# Patient Record
Sex: Female | Born: 1935 | Race: White | Hispanic: No | Marital: Married | Smoking: Never smoker
Health system: Southern US, Community
[De-identification: ages and names within clinical notes are randomized; demographics above are authoritative.]

## PROBLEM LIST (undated history)

## (undated) DIAGNOSIS — D696 Thrombocytopenia, unspecified: Secondary | ICD-10-CM

## (undated) DIAGNOSIS — Z7901 Long term (current) use of anticoagulants: Secondary | ICD-10-CM

## (undated) DIAGNOSIS — I5022 Chronic systolic (congestive) heart failure: Secondary | ICD-10-CM

## (undated) DIAGNOSIS — I2510 Atherosclerotic heart disease of native coronary artery without angina pectoris: Secondary | ICD-10-CM

## (undated) DIAGNOSIS — I509 Heart failure, unspecified: Secondary | ICD-10-CM

## (undated) DIAGNOSIS — I209 Angina pectoris, unspecified: Secondary | ICD-10-CM

## (undated) DIAGNOSIS — G20A1 Parkinson's disease without dyskinesia, without mention of fluctuations: Secondary | ICD-10-CM

## (undated) DIAGNOSIS — G43909 Migraine, unspecified, not intractable, without status migrainosus: Secondary | ICD-10-CM

## (undated) DIAGNOSIS — R0602 Shortness of breath: Secondary | ICD-10-CM

## (undated) DIAGNOSIS — G4733 Obstructive sleep apnea (adult) (pediatric): Secondary | ICD-10-CM

## (undated) DIAGNOSIS — N393 Stress incontinence (female) (male): Secondary | ICD-10-CM

## (undated) DIAGNOSIS — G20 Parkinson's disease: Secondary | ICD-10-CM

## (undated) DIAGNOSIS — Z950 Presence of cardiac pacemaker: Secondary | ICD-10-CM

## (undated) DIAGNOSIS — D649 Anemia, unspecified: Secondary | ICD-10-CM

## (undated) DIAGNOSIS — I255 Ischemic cardiomyopathy: Secondary | ICD-10-CM

## (undated) DIAGNOSIS — I10 Essential (primary) hypertension: Secondary | ICD-10-CM

## (undated) DIAGNOSIS — F32A Depression, unspecified: Secondary | ICD-10-CM

## (undated) DIAGNOSIS — E785 Hyperlipidemia, unspecified: Secondary | ICD-10-CM

## (undated) DIAGNOSIS — R531 Weakness: Secondary | ICD-10-CM

## (undated) DIAGNOSIS — K219 Gastro-esophageal reflux disease without esophagitis: Secondary | ICD-10-CM

## (undated) DIAGNOSIS — Z8719 Personal history of other diseases of the digestive system: Secondary | ICD-10-CM

## (undated) DIAGNOSIS — I442 Atrioventricular block, complete: Principal | ICD-10-CM

## (undated) DIAGNOSIS — Z9989 Dependence on other enabling machines and devices: Secondary | ICD-10-CM

## (undated) DIAGNOSIS — F329 Major depressive disorder, single episode, unspecified: Secondary | ICD-10-CM

## (undated) DIAGNOSIS — Z9289 Personal history of other medical treatment: Secondary | ICD-10-CM

## (undated) DIAGNOSIS — E039 Hypothyroidism, unspecified: Secondary | ICD-10-CM

## (undated) HISTORY — DX: Atrioventricular block, complete: I44.2

## (undated) HISTORY — PX: INCONTINENCE SURGERY: SHX676

## (undated) HISTORY — PX: CARDIAC CATHETERIZATION: SHX172

## (undated) HISTORY — DX: Stress incontinence (female) (male): N39.3

## (undated) HISTORY — DX: Parkinson's disease: G20

## (undated) HISTORY — PX: CORONARY ANGIOPLASTY WITH STENT PLACEMENT: SHX49

## (undated) HISTORY — PX: CARDIAC VALVE REPLACEMENT: SHX585

## (undated) HISTORY — DX: Chronic systolic (congestive) heart failure: I50.22

## (undated) HISTORY — PX: CHOLECYSTECTOMY: SHX55

## (undated) HISTORY — DX: Ischemic cardiomyopathy: I25.5

## (undated) HISTORY — PX: VAGINAL HYSTERECTOMY: SUR661

## (undated) HISTORY — DX: Presence of cardiac pacemaker: Z95.0

## (undated) HISTORY — PX: APPENDECTOMY: SHX54

## (undated) HISTORY — DX: Weakness: R53.1

## (undated) HISTORY — PX: PACEMAKER GENERATOR CHANGE: SHX5998

## (undated) HISTORY — PX: CATARACT EXTRACTION, BILATERAL: SHX1313

## (undated) HISTORY — DX: Hyperlipidemia, unspecified: E78.5

## (undated) HISTORY — DX: Parkinson's disease without dyskinesia, without mention of fluctuations: G20.A1

---
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
## 1998-06-27 ENCOUNTER — Encounter: Payer: Self-pay | Admitting: Cardiovascular Disease

## 1998-06-27 ENCOUNTER — Inpatient Hospital Stay (HOSPITAL_COMMUNITY): Admission: EM | Admit: 1998-06-27 | Discharge: 1998-06-29 | Payer: Self-pay | Admitting: Emergency Medicine

## 1998-07-19 ENCOUNTER — Inpatient Hospital Stay (HOSPITAL_COMMUNITY): Admission: EM | Admit: 1998-07-19 | Discharge: 1998-07-23 | Payer: Self-pay | Admitting: *Deleted

## 1998-07-20 ENCOUNTER — Encounter: Payer: Self-pay | Admitting: Cardiology

## 1999-02-12 ENCOUNTER — Ambulatory Visit (HOSPITAL_COMMUNITY): Admission: RE | Admit: 1999-02-12 | Discharge: 1999-02-13 | Payer: Self-pay | Admitting: Cardiovascular Disease

## 1999-04-30 ENCOUNTER — Inpatient Hospital Stay (HOSPITAL_COMMUNITY): Admission: AD | Admit: 1999-04-30 | Discharge: 1999-05-02 | Payer: Self-pay | Admitting: Cardiovascular Disease

## 1999-06-04 ENCOUNTER — Ambulatory Visit (HOSPITAL_COMMUNITY): Admission: RE | Admit: 1999-06-04 | Discharge: 1999-06-05 | Payer: Self-pay | Admitting: Cardiovascular Disease

## 1999-07-07 ENCOUNTER — Encounter: Payer: Self-pay | Admitting: Cardiovascular Disease

## 1999-07-07 ENCOUNTER — Encounter: Payer: Self-pay | Admitting: Cardiology

## 1999-07-07 ENCOUNTER — Inpatient Hospital Stay (HOSPITAL_COMMUNITY): Admission: EM | Admit: 1999-07-07 | Discharge: 1999-07-09 | Payer: Self-pay | Admitting: Emergency Medicine

## 1999-09-24 ENCOUNTER — Ambulatory Visit (HOSPITAL_COMMUNITY): Admission: RE | Admit: 1999-09-24 | Discharge: 1999-09-25 | Payer: Self-pay | Admitting: Cardiovascular Disease

## 2000-02-26 ENCOUNTER — Inpatient Hospital Stay (HOSPITAL_COMMUNITY): Admission: AD | Admit: 2000-02-26 | Discharge: 2000-02-28 | Payer: Self-pay | Admitting: Cardiovascular Disease

## 2000-02-28 ENCOUNTER — Encounter: Payer: Self-pay | Admitting: Cardiovascular Disease

## 2000-03-22 ENCOUNTER — Ambulatory Visit (HOSPITAL_COMMUNITY): Admission: RE | Admit: 2000-03-22 | Discharge: 2000-03-22 | Payer: Self-pay | Admitting: Cardiovascular Disease

## 2000-10-13 ENCOUNTER — Encounter: Payer: Self-pay | Admitting: Emergency Medicine

## 2000-10-13 ENCOUNTER — Inpatient Hospital Stay (HOSPITAL_COMMUNITY): Admission: EM | Admit: 2000-10-13 | Discharge: 2000-10-15 | Payer: Self-pay | Admitting: Emergency Medicine

## 2000-11-04 ENCOUNTER — Inpatient Hospital Stay (HOSPITAL_COMMUNITY): Admission: RE | Admit: 2000-11-04 | Discharge: 2000-11-05 | Payer: Self-pay | Admitting: Cardiovascular Disease

## 2001-06-22 HISTORY — PX: CORONARY ARTERY BYPASS GRAFT: SHX141

## 2001-10-18 ENCOUNTER — Encounter: Payer: Self-pay | Admitting: Cardiovascular Disease

## 2001-10-19 ENCOUNTER — Inpatient Hospital Stay (HOSPITAL_COMMUNITY): Admission: AD | Admit: 2001-10-19 | Discharge: 2001-10-21 | Payer: Self-pay | Admitting: Cardiovascular Disease

## 2002-01-18 ENCOUNTER — Encounter (INDEPENDENT_AMBULATORY_CARE_PROVIDER_SITE_OTHER): Payer: Self-pay | Admitting: Specialist

## 2002-01-19 ENCOUNTER — Encounter (INDEPENDENT_AMBULATORY_CARE_PROVIDER_SITE_OTHER): Payer: Self-pay | Admitting: Cardiology

## 2002-01-21 ENCOUNTER — Inpatient Hospital Stay (HOSPITAL_COMMUNITY): Admission: AD | Admit: 2002-01-21 | Discharge: 2002-02-03 | Payer: Self-pay | Admitting: Cardiovascular Disease

## 2002-01-22 ENCOUNTER — Encounter: Payer: Self-pay | Admitting: Cardiovascular Disease

## 2002-01-24 ENCOUNTER — Encounter: Payer: Self-pay | Admitting: Thoracic Surgery (Cardiothoracic Vascular Surgery)

## 2002-01-25 ENCOUNTER — Encounter: Payer: Self-pay | Admitting: Thoracic Surgery (Cardiothoracic Vascular Surgery)

## 2002-01-26 ENCOUNTER — Encounter: Payer: Self-pay | Admitting: Thoracic Surgery (Cardiothoracic Vascular Surgery)

## 2002-01-27 ENCOUNTER — Encounter: Payer: Self-pay | Admitting: Cardiothoracic Surgery

## 2002-01-28 ENCOUNTER — Encounter: Payer: Self-pay | Admitting: Thoracic Surgery (Cardiothoracic Vascular Surgery)

## 2002-01-30 ENCOUNTER — Encounter: Payer: Self-pay | Admitting: Thoracic Surgery (Cardiothoracic Vascular Surgery)

## 2002-01-31 ENCOUNTER — Encounter: Payer: Self-pay | Admitting: Thoracic Surgery (Cardiothoracic Vascular Surgery)

## 2002-02-27 ENCOUNTER — Encounter: Payer: Self-pay | Admitting: Thoracic Surgery (Cardiothoracic Vascular Surgery)

## 2002-02-27 ENCOUNTER — Encounter
Admission: RE | Admit: 2002-02-27 | Discharge: 2002-02-27 | Payer: Self-pay | Admitting: Thoracic Surgery (Cardiothoracic Vascular Surgery)

## 2002-03-06 ENCOUNTER — Encounter
Admission: RE | Admit: 2002-03-06 | Discharge: 2002-03-06 | Payer: Self-pay | Admitting: Thoracic Surgery (Cardiothoracic Vascular Surgery)

## 2002-03-06 ENCOUNTER — Encounter: Payer: Self-pay | Admitting: Thoracic Surgery (Cardiothoracic Vascular Surgery)

## 2002-03-27 ENCOUNTER — Encounter
Admission: RE | Admit: 2002-03-27 | Discharge: 2002-03-27 | Payer: Self-pay | Admitting: Thoracic Surgery (Cardiothoracic Vascular Surgery)

## 2002-03-27 ENCOUNTER — Encounter: Payer: Self-pay | Admitting: Thoracic Surgery (Cardiothoracic Vascular Surgery)

## 2002-04-03 ENCOUNTER — Encounter
Admission: RE | Admit: 2002-04-03 | Discharge: 2002-04-03 | Payer: Self-pay | Admitting: Thoracic Surgery (Cardiothoracic Vascular Surgery)

## 2002-04-03 ENCOUNTER — Encounter: Payer: Self-pay | Admitting: Thoracic Surgery (Cardiothoracic Vascular Surgery)

## 2002-09-29 ENCOUNTER — Encounter: Payer: Self-pay | Admitting: Emergency Medicine

## 2002-09-29 ENCOUNTER — Inpatient Hospital Stay (HOSPITAL_COMMUNITY): Admission: EM | Admit: 2002-09-29 | Discharge: 2002-10-02 | Payer: Self-pay | Admitting: Emergency Medicine

## 2003-04-04 ENCOUNTER — Other Ambulatory Visit: Admission: RE | Admit: 2003-04-04 | Discharge: 2003-04-04 | Payer: Self-pay | Admitting: Obstetrics and Gynecology

## 2003-05-02 ENCOUNTER — Ambulatory Visit (HOSPITAL_COMMUNITY): Admission: RE | Admit: 2003-05-02 | Discharge: 2003-05-02 | Payer: Self-pay | Admitting: *Deleted

## 2003-05-11 ENCOUNTER — Inpatient Hospital Stay (HOSPITAL_COMMUNITY): Admission: EM | Admit: 2003-05-11 | Discharge: 2003-05-12 | Payer: Self-pay | Admitting: Emergency Medicine

## 2003-05-21 ENCOUNTER — Ambulatory Visit: Admission: RE | Admit: 2003-05-21 | Discharge: 2003-05-21 | Payer: Self-pay | Admitting: Obstetrics and Gynecology

## 2003-09-04 ENCOUNTER — Inpatient Hospital Stay (HOSPITAL_COMMUNITY): Admission: RE | Admit: 2003-09-04 | Discharge: 2003-09-06 | Payer: Self-pay | Admitting: Obstetrics and Gynecology

## 2004-10-07 ENCOUNTER — Inpatient Hospital Stay (HOSPITAL_COMMUNITY): Admission: EM | Admit: 2004-10-07 | Discharge: 2004-10-09 | Payer: Self-pay | Admitting: Emergency Medicine

## 2005-05-08 ENCOUNTER — Other Ambulatory Visit: Admission: RE | Admit: 2005-05-08 | Discharge: 2005-05-08 | Payer: Self-pay | Admitting: Obstetrics and Gynecology

## 2005-06-19 ENCOUNTER — Inpatient Hospital Stay (HOSPITAL_COMMUNITY): Admission: RE | Admit: 2005-06-19 | Discharge: 2005-06-23 | Payer: Self-pay | Admitting: Gynecology

## 2005-06-22 HISTORY — PX: INSERT / REPLACE / REMOVE PACEMAKER: SUR710

## 2006-01-02 ENCOUNTER — Inpatient Hospital Stay (HOSPITAL_COMMUNITY): Admission: EM | Admit: 2006-01-02 | Discharge: 2006-01-09 | Payer: Self-pay | Admitting: Emergency Medicine

## 2007-03-09 ENCOUNTER — Ambulatory Visit: Payer: Self-pay | Admitting: Oncology

## 2010-06-22 HISTORY — PX: CORONARY ANGIOPLASTY WITH STENT PLACEMENT: SHX49

## 2010-10-09 ENCOUNTER — Ambulatory Visit
Admission: RE | Admit: 2010-10-09 | Discharge: 2010-10-09 | Disposition: A | Discharge status: Home / Self Care | Payer: Medicare Other | Source: Ambulatory Visit | Attending: Cardiovascular Disease | Admitting: Cardiovascular Disease

## 2010-10-09 ENCOUNTER — Other Ambulatory Visit: Payer: Self-pay | Admitting: Cardiovascular Disease

## 2010-10-10 ENCOUNTER — Ambulatory Visit (HOSPITAL_COMMUNITY)
Admission: RE | Admit: 2010-10-10 | Discharge: 2010-10-10 | Disposition: A | Discharge status: Home / Self Care | Payer: Medicare Other | Source: Ambulatory Visit | Attending: Cardiovascular Disease | Admitting: Cardiovascular Disease

## 2010-10-10 ENCOUNTER — Ambulatory Visit (HOSPITAL_COMMUNITY): Payer: Medicare Other

## 2010-10-10 DIAGNOSIS — R079 Chest pain, unspecified: Secondary | ICD-10-CM | POA: Insufficient documentation

## 2010-10-10 DIAGNOSIS — Z951 Presence of aortocoronary bypass graft: Secondary | ICD-10-CM | POA: Insufficient documentation

## 2010-10-10 DIAGNOSIS — I2510 Atherosclerotic heart disease of native coronary artery without angina pectoris: Secondary | ICD-10-CM | POA: Insufficient documentation

## 2010-10-10 DIAGNOSIS — R7989 Other specified abnormal findings of blood chemistry: Secondary | ICD-10-CM | POA: Insufficient documentation

## 2010-10-10 DIAGNOSIS — I2582 Chronic total occlusion of coronary artery: Secondary | ICD-10-CM | POA: Insufficient documentation

## 2010-10-10 DIAGNOSIS — Z01812 Encounter for preprocedural laboratory examination: Secondary | ICD-10-CM | POA: Insufficient documentation

## 2010-10-10 DIAGNOSIS — D649 Anemia, unspecified: Secondary | ICD-10-CM | POA: Insufficient documentation

## 2010-10-10 LAB — HEPATIC FUNCTION PANEL
ALT: 100 U/L — ABNORMAL HIGH (ref 0–35)
AST: 68 U/L — ABNORMAL HIGH (ref 0–37)
Albumin: 3.7 g/dL (ref 3.5–5.2)
Alkaline Phosphatase: 218 U/L — ABNORMAL HIGH (ref 39–117)
Bilirubin, Direct: 0.2 mg/dL (ref 0.0–0.3)
Indirect Bilirubin: 0.8 mg/dL (ref 0.3–0.9)
Total Bilirubin: 1 mg/dL (ref 0.3–1.2)
Total Protein: 6.3 g/dL (ref 6.0–8.3)

## 2010-10-10 LAB — CBC
HCT: 28.2 % — ABNORMAL LOW (ref 36.0–46.0)
Hemoglobin: 9.1 g/dL — ABNORMAL LOW (ref 12.0–15.0)
MCH: 27.4 pg (ref 26.0–34.0)
MCHC: 32.3 g/dL (ref 30.0–36.0)
MCV: 84.9 fL (ref 78.0–100.0)
Platelets: 152 K/uL (ref 150–400)
RBC: 3.32 MIL/uL — ABNORMAL LOW (ref 3.87–5.11)
RDW: 14.8 % (ref 11.5–15.5)
WBC: 4.7 K/uL (ref 4.0–10.5)

## 2010-10-24 NOTE — Cardiovascular Report (Signed)
NAMECHRISTMAS, FARACI NO.:  [id number]  MEDICAL RECORD NO.:  [id number]           PATIENT TYPE:  O  LOCATION:  [facility]                         FACILITY:  [facility]  PHYSICIAN:  Nicki Guadalajara, M.D.     DATE OF BIRTH:  Nov 07, 1930  DATE OF PROCEDURE:  10/10/2010 DATE OF DISCHARGE:                           CARDIAC CATHETERIZATION   INDICATIONS:  [name] is a very pleasant 77 year old female who underwent CABG revascularization surgery in 2003 and had a LIMA placed to her LAD and a vein graft to ramus intermediate vessel.  She had previously undergone stenting prior to surgery.  She also underwent mitral valve repair with annuloplasty ring.  She is status post permanent pacemaker.  Recently, in December 2011, a nuclear perfusion study showed normal perfusion.  Over the last several weeks, she has developed recurrent episodes of chest tightness.  She has been titrated on increased nitrates.  She has continued to experience tightness and now is referred for definitive cardiac catheterization.  PROCEDURE:  After premedication with Versed 1 mg plus fentanyl 25 mcg, the patient was prepped and draped in usual fashion.  Her right femoral artery was punctured anteriorly and a 5-French sheath was inserted without difficulty.  Diagnostic catheterization was done utilizing 5- [place] Judkins for left and right coronary catheters.  The right catheter was also used for selective angiography in the vein graft supplying the ramus intermediate vessel as well as the LIMA graft supplying her mid distal LAD.  Biplane cine left ventriculography was done utilizing a 5-French pigtail catheter.  The patient tolerated the procedure well.  During the procedure, she also received 200 mcg of intracoronary nitroglycerin via the vein graft supplying the ramus intermediate vessel.  Hemostasis was obtained by direct manual pressure.  HEMODYNAMIC DATA:  Central aortic pressure was 153/58.   Left ventricular pressure 153/15.  ANGIOGRAPHIC DATA:  Left main coronary artery had previously noted 80% distal stenosis unchanged and trifurcated into an LAD, the ramus intermediate vessel and a small diminutive circumflex.  The LAD gave rise to a large proximal septal perforating artery.  After the first diagonal vessel, there was 30% LAD narrowing and a flush and fill phenomena was seen in the LAD, particularly beyond the second diagonal vessel.  The ramus intermediate vessel had a stent placed ostially.  There was total occlusion of the stent in its midsegment.  The circumflex was a diminutive small vessel.  The right coronary artery was unbypassed and it was a large dominant vessel.  There was 10% to 20% mild luminal irregularity in the mid segment as well as in the region of the crux.  The RCA supplied the PDA, inferior LV branch, and a posterolateral coronary arteries, and otherwise was free of significant obstructive disease.  The vein graft which supplied the ramus intermediate vessel was patent. There was mild ectasia noted throughout the graft, but there was no significant stenoses.  The LIMA graft was widely patent and anastomosed into the mid LAD.  The apical portion of the LAD was very small caliber.  Biplane cine left ventriculography revealed low normal ejection fraction of  approximately 50%.  On the RAO view, there did appear to be mild apical hypocontractility.  On the LAO view, there did appear to be some distal septal dyssynchrony contributed by the patient's paced rhythm.  IMPRESSION: 1. Significant native coronary artery disease with no change in the     previously noted 80% to 85% distal left main stenosis. 2. 30% stenosis in the LAD after the first diagonal vessel. 3. Total occlusion of the ramus intermediate vessel proximally in the     midportion of the remotely placed ostial stent. 4. Very small diminutive circumflex vessel. 5. Dominant right  coronary artery with mild 10% to 20% luminal     irregularity. 6. Patent, but somewhat ectatic vein graft supplying the ramus     intermediate vessel. 7. No significant change following IC nitroglycerin initiation. 8. Patent LIMA graft supplying the mid LAD. 9. Low normal LV contractility with an ejection fraction approximately     50% with suggestion of mild apical hypocontractility with the     distal septal dyssynergy contributed by the paced rhythm.  RECOMMENDATIONS:  Medical therapy.  Of note, the patient's LFTs were noted to be elevated and she did have mild anemia.  Stool guaiac will be obtained and she will have abdominal ultrasound.  She will hold her Lipitor.          ______________________________ Nicki Guadalajara, M.D.     TK/MEDQ  D:  10/10/2010  T:  10/11/2010  Job:  [id number]  cc:   Keturah Barre, MD  Electronically Signed by Nicki Guadalajara M.D. on 10/24/2010 02:27:51 PM

## 2010-11-07 NOTE — Consult Note (Signed)
NAMETEMPIE, GIBEAULT NO.:  [id number]   MEDICAL RECORD NO.:  [id number]                   PATIENT TYPE:  INP   LOCATION:  2399                                 FACILITY:  [facility]   PHYSICIAN:  Gwenith Daily. Tyrone Sage, M.D.            DATE OF BIRTH:  06/12/1931   DATE OF CONSULTATION:  01/18/2002  DATE OF DISCHARGE:                       CARDIOVASCULAR/THORACIC CONSULTATION   REASON FOR CONSULTATION:  Recurrent coronary artery disease with unstable  angina.   HISTORY OF PRESENT ILLNESS:  The patient is a 77 year old female with a four-  year history of coronary occlusive disease, having had 18 cardiac  catheterizations with numerous interventions over that four-year period.  Most recently she has had intervention performed in 4/03.  Her last  catheterization in 4/03 revealed distal in-stent stenosis of the  intermediate vessel.  She underwent cutting balloon intervention and  brachytherapy.  She was also noted  to have a new 40% mid-left main  narrowing.  She had brachytherapy at [facility] in the past.  She has also had  angioplasty of her proximal LAD but on the most recent catheterization in  April, this appeared to be patent.  Following the intervention in April, the  patient had been pain-free until approximately three weeks ago, when she  began having recurrent episodes of chest pain, sometimes at rest, sometimes  with exertion.  She notes that over the past three weeks the episodes have  become more frequent and requiring more nitroglycerin to stop.  They do not  radiate into her throat.  At times she has had some symptoms of nausea and  occasional diaphoresis.  She notes that she has recently stopped taking  Premarin and has noted more episodes of diaphoresis even without episodes of  chest pain.  Because of these increasing symptoms and unstable pattern, the  patient was readmitted by Dr. Tresa Endo today and underwent cardiac  catheterization.   Cardiac  risk factors:  The patient denies hypertension.  She denies  hyperlipidemia but is on Pravachol.  She denies smoking history or diabetes  history.  She does have a family history, her father had an MI  in his 65s,  mother had history of hypertension.  She has one brother who has had a  stent.  She has had no previous stroke, denies claudication, denies COPD,  denies renal dysfunction.   PAST MEDICAL HISTORY:  The patient was diagnosed with mitral valve prolapse  approximately 15 years ago.  This was initially brought to attention because  of atrial arrhythmias.  At that time she was started on Toprol and has had  no recent recurrences.   PAST SURGICAL HISTORY:  Appendectomy and hysterectomy.   SOCIAL HISTORY:  The patient is married, lives with her husband.  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   MEDICATIONS:  Medications at the time of admission include Toprol XL 50 mg a  day,  aspirin 325 mg a day, Norvasc 5 mg a day.  This has recently been  decreased because of increasing lower extremity edema.  She continues to  have lower extremity edema but to a lesser degree since decreasing the dose  of Norvasc.  She is also on Pravachol 40 mg a day, Plavix 75 mg a day (which  she has taken for several years), Imdur 120 mg a day, Altace 10 mg a day,  Paxil 20 mg a day, folic acid 2.5 mg a day, vitamin C 1000 mg a day, vitamin  E 400 mg a day.   DRUG ALLERGIES:  None known.   REVIEW OF SYMPTOMS:  CARDIAC:  Positive chest pain.  Denies resting  shortness of breath or exertional shortness of breath or orthopnea or  syncope.  She has had over the past several weeks associated chest  discomfort, episodes of dizziness.  She does have lower extremity edema as  noted.  She denies palpitations.  GENERAL:  with exception of chest pain ove  the past three weeks, the patient has had no constitutional complaints.  Denies fever or chills.  RESPIRATORY:  Denies any recent shortness of  breath  or cough.  Denies hemoptysis.  GASTROINTESTINAL:  Denies any bowel changes.  Denies blood in her stool.  NEUROLOGIC:  Denies any amaurosis fugax or TIAs.  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  She has one finger of her right hand in which the joint stays  swollen.  GENITOURINARY:  Denies any blood in her urine.  Denies urinary  difficulty.  INFECTIOUS:  She has had no recent infections.  Because of the  mitral valve prolapse, she does take antibiotics before any dental work.  HEMATOLOGIC:  She notes a lifelong history of easy bruisability but has had  no recent unusual bleeding.  ENDOCRINE:  She denies symptoms of diabetes or  hypothyroidism.  She does note diaphoresis and hot flashes since she stopped  taking Premarin recently.  PSYCHIATRIC:  None.  PERIPHERAL VASCULAR:  Denies  claudication.   PHYSICAL EXAMINATION:  VITAL SIGNS:  The patient's blood pressure is 143/60,  on recheck was 110/60, sinus rhythm at 62, room air saturation 96%.   GENERAL:  The patient is awake and alert and neurologically intact.  Appears  her stated age.   HEENT:  Pupils equal, round, and reactive to light.   NECK:  Without carotid bruits of jugular venous distention.   CHEST:  Lungs are clear bilaterally without wheezing.   CARDIAC:  Regular rate and rhythm.  I do not appreciate any murmur of mitral  insufficiency.   ABDOMEN:  Benign without palpable masses or tenderness.  The aorta was not  palpably enlarged.   EXTREMITIES:  Very mild pedal edema bilaterally.  Appears to have adequate  vein in both lower extremities, right vein; the right vein, I think, was  slightly larger than the left.   NEUROLOGIC:  Grossly intact.   LYMPHATIC:  She had no palpable lymph nodes in cervical or supraclavicular  area.   SKIN:  Without ischemic changes.   VASCULAR:  2+ DP and PT pulses bilaterally.  There is a dressing over the right groin from the  catheter site, but there is no significant  hematoma.   LABORATORY DATA:  Laboratory findings done prior to cardiac catheterization  reveal hemoglobin 11.5, hematocrit of 33.7.  There are no previous  laboratory findings to compare this to noted in her  chart.  Platelet count  is 157,000.  PT is 12, INR is 1.02, PTT is 25.5.  BUN 16, creatinine 0.6.  Other labs are pending.   Cardiac catheterization films performed today were reviewed.  The patient's  right coronary artery is patent.  The left main has a hazy narrowing in the  midportion that is at least 50% if not up to 60%.  The main body of the LAD  is without significant stenosis.  The patient has a very small circumflex  system and has a small ramus intermedius with obvious several stents in  place that have a greater than 90-95% stenosis.  Overall ventricular  function is improved.  The catheter is high in the LV with slight amount of  mitral regurgitation, and this most likely is catheter-induced.    IMPRESSION:  1. The patient is a 77 year old female who has over the past four years had     18 cardiac catheterizations and  interventions, now with unstable angina     predominantly relating to a moderately small ramus intermedius branch but     also has a left main obstruction of at least 50% if not greater.  2. Anemia.  From her current history and records, it is unable to determine     if this is of new onset or has been a chronic problem.  3. History of mitral valve prolapse.  The patient notes she has had no     recent echocardiogram.  4. Chronic aspirin and Plavix therapy, including both of the drugs being     administered today.  In addition, the patient has also been on chronic     vitamin E, all of which may potentiate postoperative bleeding.   SUGGESTIONS:  I have discussed with the patient the pros and cons of bypass  surgery and especially in light of the now at least 50% left main  obstruction.  The risks of the  procedure, including death, infection,  stroke, myocardial infarction, bleeding with blood transfusion, have all  been discussed with the patient.  The postop risk of bleeding because of  aspirin and Plavix and vitamin E therapy, which may precipitate need for  earlier transfusion requirements, has been discussed with the patient.  Will  plan to obtain a bleeding time, some basic anemia workup studies,  echocardiogram to evaluate her mitral valve.  Tentative surgery for Friday  or this week or, dependent on the laboratory findings, next week.                                                   Gwenith Daily Tyrone Sage, M.D.    Tyson Babinski  D:  01/18/2002  T:  01/24/2002  Job:  [id number]

## 2010-11-07 NOTE — Procedures (Signed)
[place]  Patient:    Taylor, Erickson Visit Number: [id number] MRN: [id number]          Service Type: MED Location: [id number] Attending Physician:  Virgina Evener Dictated by:   Lennette Bihari, M.D. Admit Date:  10/18/2001 Discharge Date: 10/21/2001                             Procedure Report  PROCEDURES:  Percutaneous coronary intervention/brachytherapy/IVUS ultrasound.  INDICATIONS:  [name] is a 77 year old white female who underwent repeat coronary intervention on October 18, 2001.  Please refer to the catheterization and intervention report.  At that time, she had developed in-stent restenosis in the distal stent in the ramus intermedius vessel.  The ostial proximal stent had stayed fairly patent.  She underwent successful cutting balloon atherotomy in the 2.25 stent utilizing a 2.5-mm cutting balloon.  Plans are for brachytherapy today.  The patient has been maintained on IV Integrilin.  DESCRIPTION OF PROCEDURE:  After premedication with Valium intravenously, the patient was prepped and draped in the usual fashion.  The right femoral artery was punctured anteriorly and a #7 [place] system was inserted.  An FL-3.5 guide was used for the interventional procedure.  Intracoronary nitroglycerin, 200 units x2, was administered.  Heparin, 3000 units, was given after baseline ACT determination was 148.  A BMW wire was advanced down the intermedius vessel. An IVUS catheter, UltraCross, was then advanced down the intermedius vessel beyond the more distal stent.  This confirmed the vessel size at the distal portion of the second stent at approximately 2.1 mm.  The IVUS catheter was moved more proximally into the more proximal stent and this was approximately 2.6 mm size.  The IVUS catheter was then withdrawn into the left main to visualize plaque.  There was plaque present distally and in the mid portion of the left main.   Distally, the percent diameter stenosis was 25.3%.  In the mid portion, the percent diameter stenosis was 38.3%.  Balloon dilatation was then done in the distal stent of the intermedius vessel utilizing a 2.5 x 15-mm CrossSail balloon to get the stent size at approximately 2.5 mm to accommodate the [facility] system.  The [facility] centering catheter was then advanced in the intermedius vessel.  There was some difficulty in having this catheter get to its complete position but ultimately this was successful.  Initially a low-level inflation was begun but it became apparent that the patient was developing significant ischemia with development of significant ST segment depression, necessitating letting down the balloon and removing the system back into the guiding catheter.  Dopamine, 3 cc, was started.  The centering catheter was then repositioned and initial interrupted dwell time was performed for 58 seconds.  Again, this had to be interrupted due to the significant ST segment depression and development of hypotension with blood pressure of 70.  The centering catheter was pulled back into the guide system with restoration of normal blood pressure and resolution of ST segment depression.  The centering catheter was then again repositioned and completion of the dwell time was performed for a total treatment time of 93 seconds at a dose of 20 Gy.  Angiography confirmed an excellent angiographic result.  The patient tolerated the procedure well.  She will continue on Integrilin through the night with plans for discharge home tomorrow. Dictated by:   Lennette Bihari, M.D. Attending Physician:  Virgina Evener DD:  10/20/01 TD:  10/23/01 Job: [id number]

## 2010-11-07 NOTE — Op Note (Signed)
[name], CORTESE NO.:  [id number]   MEDICAL RECORD NO.:  [id number]          PATIENT TYPE:  INP   LOCATION:  2005                         FACILITY:  [facility]   PHYSICIAN:  Darlin Priestly, MD  DATE OF BIRTH:  1930-11-15   DATE OF PROCEDURE:  01/08/2006  DATE OF DISCHARGE:                                 OPERATIVE REPORT   OPERATIVE PROCEDURE:  Insertion of a Medtronic EnRhythm P1501DR, serial  number [id number], generator with passive ventricular and active atrial  leads.   ATTENDING:  Darlin Priestly, MD   COMPLICATIONS:  None.   INDICATION:  Ms. Sledd is a 78 year old female patient of Dr. Nicki Guadalajara  with a history of CAD status post multiple percutaneous interventions and  ultimate bypass surgery in 2003.  She has had intermittent episodes of  bradycardia on excellent medical therapy for her CAD.  She was admitted on  January 02, 2006, after what sounds like a presyncopal episode.  Was found to  be bradycardic.  We did hold all her negative chronotropic agents; however,  she continued to have intermittent episodes of bradycardia.  She is now  referred for a dual-chamber pacer implant for sick sinus syndrome.   DESCRIPTION OF OPERATION:  After informed consent, the patient brought to  the cardiac cath lab, where her left chest was prepped and draped in a  sterile fashion.  ___________ was established, 1% lidocaine was then used to  anesthetize the mid left subclavicular area.  Next, approximately a 3-cm  midclavicular horizontal incision was then carried out, and hemostasis was  obtained with electrocautery.  Blunt dissection was then carried down to the  left pectoral fascia, and again, hemostasis was obtained.  Next, an  approximately 3 x 4-cm pocket was created over the left pectoral fascia.  The left subclavian vein was then easily entered with passage of a guidewire  into the SVC and right atrium.  A 2nd stick was then performed, and easily  passed a 2nd guidewire into the SVC and right atrium.  Two silk sutures were  then placed at the base of each retained guidewire to anchor the leads to  the pectoral fascia.  A 7-French sheath then easily passed over the 1st  retained guidewire and the guidewire and dilator removed.  Over this, a 52-  cm passive Medtronic lead, model #4092, serial number [id number], was then  easily passed into the right atrium.  The peel-away sheath was removed.  A  2nd 7-French dilator and sheath were inserted over the 2nd retained  guidewire, and the guidewire and dilator were removed.  Following this, a 45-  cm, active atrial lead, model #5076, serial number [id number], was then  easily inserted into the right atrium, and the peel-away sheath was removed.  A J-curve was then placed in the ventricular lead stylet and the ventricular  leads then allowed to prolapse the tricuspid valve, and position of the RV  apex and thresholds were determined.  R waves were measured at 21.6  millivolts.  Impedence 638 ohms.  Thresholds 0.3 volts at 0.5 milliseconds.  Currents at 0.6 milliamps.  Ten volts were negative for diaphragmatic  stimulation.  Next, the preformed J-stylet was then placed in the atrial  lead, and the atrial lead was then positioned in the area of the right  atrial appendage.  The head screw was then extended.  P waves were then  measured at 2.7 millivolts.  Threshold in the atrium was 0.9 volts at 0.5  milliseconds.  Currents 1.7 milliamps.  Impedance 643 ohms.  Ten volts were  negative for diaphragmatic stimulation.  Each lead was then secured to the  previously placed silk suture.  The pocket was then copiously irrigated with  1% gentamicin solution.  The leads were then connected in a serial fashion  to Medtronic EnRhythm generator, model P1501DR, serial number [id number].  ___________ screws were tightened.  A single silk suture was then placed in  the apex pocket.  The generator lead was  then delivered into the pocket, and  the __________ was then secured to the silk suture.  Subcutaneous layers  were closed with running 2-0 Vicryl.  The skin was then closed with running  4-0 Vicryl.  Steri-Strips were then applied.  The patient returned to the  recovery room in stable condition.   CONCLUSION:  Successful implant of a Medtronic EnRhythm [id number] generator,  serial number [id number], with passive ventricular and active atrial  leads.      Darlin Priestly, MD  Electronically Signed     RHM/MEDQ  D:  01/08/2006  T:  01/08/2006  Job:  [id number]   cc:   Nicki Guadalajara, M.D.

## 2010-11-07 NOTE — Discharge Summary (Signed)
[name], COGAN NO.:  [id number]   MEDICAL RECORD NO.:  [id number]          PATIENT TYPE:  INP   LOCATION:  2005                         FACILITY:  [facility]   PHYSICIAN:  Nanetta Batty, M.D.   DATE OF BIRTH:  [birth year]   DATE OF ADMISSION:  01/02/2006  DATE OF DISCHARGE:  01/09/2006                                 DISCHARGE SUMMARY   DISCHARGE DIAGNOSES:  1. Unstable angina, resolved.  2. Asystole with right bundle branch block and intermittent left bundle      branch block and chronic left anterior fascicular block, Mobitz II.      a.     Permanent transvenous pacemaker placed buy Dr. Lajuana Ripple,       Medtronic in Rhythm, [id number], Serial #[id number], placed left upper       chest.  3. History of coronary disease with bypass grafting.  Cardiac      catheterization with this admission revealed patent grafts and LV      within normal limits.  4. Placement of temporary pacemaker secondary to asystole with syncope.  5. Treated hypertension.  6. Treated dyslipidemia.  7. Past history of anxiety.  8. Prolonged QT phase secondary to Ranexa which was discontinued.   DISCHARGE MEDICATIONS:  1. Aspirin 81 mg daily, resume 01/10/2006.  2. Hold Plavix until 01/12/2006 then resume at 75 mg daily.  3. Diovan HCT 150/25 daily.  4. Pravachol 40 mg daily.  5. Vivelle patch as before.  6. Paxil 20 mg daily.  7. Prilosec one twice a day, 20 mg.  8. Toprol XL 50 mg daily.  9. Imdur 60 daily.  10.Multi-vitamins and B12 as before.  11.Nitroglycerin as needed under the tongue.   DISCHARGE INSTRUCTIONS:  1. Low fat, low salt diet.  2. Followup pacemaker discharge instructions, preprinted form.  3. Followup with Dr. Alford Highland PA, [place], with office call for date      and time.  4. Followup with Dr. Tresa Endo in approximately two weeks.   CONDITION ON DISCHARGE:  Discharge condition improved.   PROCEDURE:  01/03/2006 - combined left heart catheterization with  graft  visualization by Dr. Nanetta Batty.  01/03/2006 - placement of temporary pacemaker by Dr. Nanetta Batty for  asystole with syncope.  01/08/2006 - placement of dual chamber In Rhythm Medtronic pacemaker by Dr.  Lajuana Ripple.   HISTORY OF PRESENT ILLNESS:  This is a 77 year old white female of Dr.  Nicki Guadalajara with a history of coronary disease undergoing bypass grafting  in August of 2003.  She has had more than 20 heart catheterizations in the  past.  Recently she has been treated medically.  She had a patent LIMA to  the LAD and patent saphenous vein graft to the ramus in 09/2004 and the RCA  was found to be normal.  The left main has an 80% narrowing, OM 1 was total,  circumflex was without disease, but could be jeopardized by the left main.  She has been treated medically and has received ECP in the past.  She had  seen Dr. Tresa Endo for  increasing chest pain and Dr. Tresa Endo had added Ranexa and  increased her Imdur on 01/02/2006 she presented to the emergency room after  an episode of chest pain, associated with diaphoresis, vomiting and  dizziness.   PAST MEDICAL HISTORY:  As previously stated.  In addition, hypertension,  dyslipidemia and anxiety and PAF briefly after a bypass and was on Coumadin  for three months.  She also has had a previous bladder tacking in the past.   MEDICATIONS:  Plavix 75 mg daily, Diovan 160, 25 daily, Prilosec 20,  Pravachol 40, Toprol-XL 50, Imdur 90, aspirin daily, Ranexa 500 b.i.d.   ALLERGIES:  SULFA.  INTOLERANT TO ACE INHIBITORS WHICH CAUSE COUGH, ALSO  INTOLERANT TO DILAUDID AND MORPHINE.   SOCIAL HISTORY, FAMILY HISTORY, REVIEW OF SYSTEMS:  See H and P.   PHYSICAL EXAMINATION:  On discharge, alert and oriented white female in no  acute distress.  Vital signs:  Blood pressure 114/60, heart rate 62,  respirations 18, oxygen saturation 97%.  On telemetry she was in sinus  rhythm, first degree AV block with pacing as needed.  Chest x-ray  status  post pacer without pneumo.  On exam lungs are clear without rales.  Heart  regular rate and rhythm without S3.  Bowel sounds positive and no lower  extremity edema.   LABORATORY DATA:  Hemoglobin on admission was 11.2, hematocrit 32.7, WBC  7.5, platelets 131, neutrophils 87, lymphs 8, monos 4, eos 1, basos 0.  She  had in the past, some thrombocytopenia, but did not see a hematologist.  Here in the hospital she got as low as 96 for platelets and at discharge  while she was off the Plavix, platelets rose to 175.  White count 14.5.  INR 1.1.  PTT 29.  On Heparin she was therapeutic.  We  did do a rapid screen that was nonreactive, not the cause of the  thrombocytopenia.  Chemistry 136.  Potassium 3.2.  Chloride 102.  CO2 24.  Glucose 134.  BUN 14.  Creatinine 0.9.  Calcium 9.2.  Total protein 6.4.  Albumin 4.1.  AST 18.  ALT 15.  LP 47.  Total bilirubin 1.  Direct bilirubin  0.1.  Indirect bilirubin 0.9.  Magnesium 2.1.  Glucose stayed mildly  elevated when awake to 120 most of the hospitalization.  Potassium was  replaced.  Cardiac enzymes:  CK ranged 73, 66, 61 and these were all  negative as well as troponin I at 0.01 to 0.02.  TSH 1.107.  UA was clear.  Urine cultures did show Escherichia coli and Enterococcus.   Abdominal ultrasound on 01/03/2006, gallbladder sludge without evidence of  cholelithiasis or cholecystitis.  A tiny amount of pericholecystic fluid may  represent ascites.  Consider further evaluation if needed.  Chest x-ray  after pacer placement without pneumonia, COPD, mild cardiomegaly.  EKG on  admission, sinus rhythm, first degree AV block, right axis deviation,  nonspecific intraventricular conduction block, first degree AV block, new  since previous tracing and right bundle branch block which was resolved.  Followup on the 21st revealed atrial pacing, ventricular sensing.   HOSPITAL COURSE:  This patient was admitted by Dr. Allyson Sabal on call for Dr. Tresa Endo on  01/02/2006 because of her pain and due to asystole while on the bed  pan for five seconds.  The patient was unresponsive.  She was taken  emergently to the cath lab.  Cardiac catheterization was done which revealed  80% left main with unprotected circ  as well as patent grafts.  Temporary  pacemaker was inserted and the patient was sent back to the coronary care  unit.  She remained stable with a temporary pacer.  Plavix was held.  She  was found to be thrombocytopenic.  Hep panel screening was done which was  negative.  She continued to be stable and temporary pacer was eventually  discontinued.  Her hypokalemia was treated.   On 07/19 the patient originally had been discussed with physicians to come  off the Plavix at home and come back for pacer, but by the 19th she wanted  to go ahead and do the procedure here.  On 01/08/2006, placement of  permanent pacemaker and by 01/09/2006 she was stable and ready for discharge  home, no pneumonia on chest x-ray.  Was feeling well.  Dr. Tresa Endo saw her and  felt she was ready for discharge.      Nanetta Batty, M.D.  Electronically Signed     JB/MEDQ  D:  02/18/2006  T:  02/18/2006  Job:  [id number]   cc:   Nicki Guadalajara, M.D.  Keturah Barre, MD

## 2010-11-07 NOTE — Cardiovascular Report (Signed)
[name], [name] NO.:  [id number]   MEDICAL RECORD NO.:  [id number]          PATIENT TYPE:  INP   LOCATION:  2904                         FACILITY:  [facility]   PHYSICIAN:  Nanetta Batty, M.D.   DATE OF BIRTH:  [birth year]   DATE OF PROCEDURE:  DATE OF DISCHARGE:                              CARDIAC CATHETERIZATION   [name] is a 77 year old married white female with history of CAD status  post mitral valve repair and two-vessel CABG in 2003.  She was cathed in  April 2006 and found to have patent grafts with an unprotected AV groove  circumflex.  She has had a total of 22 caths by her account.  She has  undergone an ECT.  She saw Dr. Tresa Endo in the office last week and began her  on Ranexa because of chest pain.  She was brought to the hospital tonight by  her husband with accelerated chest pain, nausea and diaphoresis.  Her EKG  showed right bundle branch block which was old.  Initial enzymes were  negative.  She was admitted to the ICU where she developed recurrent chest  pain, nausea, vomiting, with a 6 second sinus pause.  She is brought  urgently to the cath lab for further evaluation.   DESCRIPTION OF PROCEDURE:  The patient was brought to the second floor [facility] Cardiac Cath Lab urgently.  She was not premedicated.   Her right groin was prepped and draped in the usual sterile fashion.  1%  Xylocaine was used for local anesthesia.  A 6-French sheath was inserted  into the right femoral artery using standard Seldinger technique.  A 6-  French sheath was inserted into the right femoral vein.  A 5-French balloon-  tipped temporary transvenous pacemaker was then placed in the RV apex and  set at 5 MA with demonstration of pacing at 80.  The rate was then decreased  to 60 and the patient had her intrinsic rhythm.  A 6-French right and left  Judkins diagnostic catheter, as well as a 6-French pigtail catheter were  used for selective cholangiography, left  ventriculography and supravalvular  aortography.  Visipaque dye was used through the entirety of the case.  Aortic and left ventricular blood pressures were recorded.   HEMODYNAMIC RESULTS:  Aortic systolic pressure 156, diastolic pressure 16,  left ventricular systolic pressure was 62 and diastolic pressure 21.   SELECTIVE CORONARY ANGIOGRAPHY.:  1.  Left main; 8% distal stenosis.  2.  LAD; LAD gave off two diagonal branches.  The first diagonal branch had      80% and ostial stenosis, and the second one had 90% ostial/proximal      stenosis.  There was competitive flow distally where the LIMA inserted      beyond the second diagonal branch.  3.  Ramus branch; occluded within the previously stented segment.  4.  Left circumflex; small nondominant and free of significant disease.  5.  Left RCA; widely patent.  6.  Vein graft to ramus branch; widely patent.  7.  LIMA to the LAD, widely  patent.  8.  Left ventriculography; RAO left ventriculograms performed using 25 mL of      Visipaque dye at 12 mL per second.  The overall LVEF is estimated at      greater than 60% without focal wall motion abnormalities.  9.  Supravalvular aortography performed in the LAO view using 20 mL of      Visipaque dye at 20 mL per second.  There is no dissection noted.  There      is 1-2+ AI noted.   IMPRESSION:  [name] has widely patent grafts with anatomy unchanged from  her prior cath approximately 1 year ago.  I am unclear of the etiology of  her chest pain.  It may be related to esophageal reflux versus  cholelithiasis.  I am going to wean off her nitro and treat her with a  proton pump inhibitor.  She will need a chest and abdominal CT for further  evaluation.  Will get GI involved in the morning.   The sheaths were sewn securely in place as was the pacemaker which will be  kept in overnight prophylactically because of her pause.  She left the lab  in stable condition.      Nanetta Batty,  M.D.  Electronically Signed     JB/MEDQ  D:  01/03/2006  T:  01/03/2006  Job:  [id number]   cc:   [facility]   Nicki Guadalajara, M.D.  Fax: [phone]   Keturah Barre, M.D.  Family Practice  [place]

## 2010-11-07 NOTE — H&P (Signed)
[name], [name] NO.:  [id number]   MEDICAL RECORD NO.:  [id number]          PATIENT TYPE:  INP   LOCATION:  1827                         FACILITY:  [facility]   PHYSICIAN:  Abelino Derrick, P.A.   DATE OF BIRTH:  1931/06/19   DATE OF ADMISSION:  01/02/2006  DATE OF DISCHARGE:                                HISTORY & PHYSICAL   HISTORY:  [name] is a 77 year old female followed by Dr. Tresa Endo and Dr.  Sherral Hammers with a history of coronary disease.  She has had more than 20  catheterizations in the past.  She had bypass surgery in August of 2003.  She was catheterized in November 2004 and treated medically.  She was  admitted, again, in April, 2006 with chest pain and was catheterized and has  been treated medically.  She had a patent LIMA to the LAD and a patent SVG  to the ramus.  The RCA was normal.  The left main had an 80% narrowing.  The  OM1 was totaled.  The circumflex was without disease, but essentially was  jeopardized by the left main.  She had been treated medically and has had  EECP in the past.  She saw Dr. Tresa Endo in the office, and complained of  increasing chest pain over the last month.  Dr. Tresa Endo added Ranexa and  increased her Imdur.  She presented to the emergency room, today, after an  episode of chest pain at home which was associated with diaphoresis,  vomiting, and dizziness.   PAST MEDICAL HISTORY:  Otherwise remarkable for hypertension and  dyslipidemia and anxiety.  She did have a PAF briefly after her bypass and  was on Coumadin for 3 months.  She has had EECP in the past.  She has had  previous bladder tacking in the past.   CURRENT MEDICATIONS:  1.  Plavix 75 mg a day.  2.  Diovan 160/25 daily.  3.  Prilosec 20 mg a day.  4.  Pravachol 40 mg a day.  5.  Toprol XL 50 mg a day.  6.  Imdur was increased to 90 mg a day.  7.  Aspirin daily.  8.  Ranexa 500 mg b.i.d.   ALLERGIES:  She is allergic to SULFA and intolerant to ACE inhibitors  which  cause a cough, also intolerant to Dilaudid and morphine.   SOCIAL HISTORY:  She is a nonsmoker.  She is married.  She has 3 children  and 8 grandchildren.  She lives with husband.   FAMILY HISTORY:  Unremarkable for coronary disease.  Her father died of an  MI, but he was in his 62s.  She has one brother without coronary disease.   REVIEW OF SYSTEMS:  Essentially unremarkable except as noted above.  She did  have recent surgery for vaginal prolapse by Dr. Mia Creek in January 2007.   PHYSICAL EXAM:  VITAL SIGNS:  Blood pressure 145/77, pulse 58, respirations  12.  GENERAL:  She is a well-developed, pale female complaining of chest pain.  HEENT: Normocephalic, atraumatic.  Extraocular movements are intact.  Sclerae were anicteric.  She does wear glasses.  NECK:  Without bruit and without JVD.  CHEST:  Essentially clear to auscultation and percussion.  CARDIAC EXAM:  Reveals regular rate and rhythm without murmur, rub, or  gallop.  Normal S1-S2.  ABDOMEN:  Nondistended, nontender.  Bowel sounds are present.  EXTREMITIES:  Without edema.  She does have diminished pulses in the left  lower extremity but 3+/4 in the right.  There are no bruits.  NEURO EXAM:  Grossly intact.   EKG:  Reveals a sinus rhythm with right bundle branch block, this was seen  in the office, but not on her last admission.  The QTC is 543/.   LABS:  Pending.   CHEST X-RAY:  Pending.   IMPRESSION:  1.  Unstable angina.  2.  Known coronary disease with multiple catheterizations in the past,      coronary artery bypass grafting in 2003 and in November of 2004 and      April of 2006; the plan has been for medical therapy.  3.  Treated hypertension.  4.  Treated dyslipidemia.  5.  Past history of anxiety.   PLAN:  The patient was seen by Dr. Allyson Sabal and myself in the emergency room.  We will go ahead and stop her Ranexa as her QTC is prolonged.  She will be  admitted to telemetry, started on heparin and  nitrates.  She will probably  need restudy, we may want to consider left main stenting.  This will be  discussed with Dr. Tresa Endo.      Abelino Derrick, P.ALenard Lance  D:  01/02/2006  T:  01/02/2006  Job:  [id number]   cc:   Dr. Tresa Endo

## 2011-07-05 DIAGNOSIS — T148 Other injury of unspecified body region: Secondary | ICD-10-CM | POA: Diagnosis not present

## 2011-07-10 DIAGNOSIS — H40019 Open angle with borderline findings, low risk, unspecified eye: Secondary | ICD-10-CM | POA: Diagnosis not present

## 2011-07-10 DIAGNOSIS — H04129 Dry eye syndrome of unspecified lacrimal gland: Secondary | ICD-10-CM | POA: Diagnosis not present

## 2011-07-10 DIAGNOSIS — H1045 Other chronic allergic conjunctivitis: Secondary | ICD-10-CM | POA: Diagnosis not present

## 2011-08-04 DIAGNOSIS — R0789 Other chest pain: Secondary | ICD-10-CM | POA: Diagnosis not present

## 2011-08-04 DIAGNOSIS — G4733 Obstructive sleep apnea (adult) (pediatric): Secondary | ICD-10-CM | POA: Diagnosis not present

## 2011-08-04 DIAGNOSIS — I119 Hypertensive heart disease without heart failure: Secondary | ICD-10-CM | POA: Diagnosis not present

## 2011-08-17 DIAGNOSIS — Z79899 Other long term (current) drug therapy: Secondary | ICD-10-CM | POA: Diagnosis not present

## 2011-08-17 DIAGNOSIS — R0789 Other chest pain: Secondary | ICD-10-CM | POA: Diagnosis not present

## 2011-08-17 DIAGNOSIS — E782 Mixed hyperlipidemia: Secondary | ICD-10-CM | POA: Diagnosis not present

## 2011-08-17 DIAGNOSIS — I119 Hypertensive heart disease without heart failure: Secondary | ICD-10-CM | POA: Diagnosis not present

## 2011-08-17 DIAGNOSIS — R5381 Other malaise: Secondary | ICD-10-CM | POA: Diagnosis not present

## 2011-08-20 DIAGNOSIS — Z95818 Presence of other cardiac implants and grafts: Secondary | ICD-10-CM | POA: Diagnosis not present

## 2011-08-20 DIAGNOSIS — R42 Dizziness and giddiness: Secondary | ICD-10-CM | POA: Diagnosis not present

## 2011-08-20 DIAGNOSIS — R0789 Other chest pain: Secondary | ICD-10-CM | POA: Diagnosis not present

## 2011-08-20 DIAGNOSIS — I2510 Atherosclerotic heart disease of native coronary artery without angina pectoris: Secondary | ICD-10-CM | POA: Diagnosis not present

## 2011-08-20 HISTORY — PX: OTHER SURGICAL HISTORY: SHX169

## 2011-09-22 DIAGNOSIS — J309 Allergic rhinitis, unspecified: Secondary | ICD-10-CM | POA: Diagnosis not present

## 2011-09-22 DIAGNOSIS — R5381 Other malaise: Secondary | ICD-10-CM | POA: Diagnosis not present

## 2011-09-22 DIAGNOSIS — J209 Acute bronchitis, unspecified: Secondary | ICD-10-CM | POA: Diagnosis not present

## 2011-09-22 DIAGNOSIS — R5383 Other fatigue: Secondary | ICD-10-CM | POA: Diagnosis not present

## 2011-09-30 DIAGNOSIS — G4733 Obstructive sleep apnea (adult) (pediatric): Secondary | ICD-10-CM | POA: Diagnosis not present

## 2011-09-30 DIAGNOSIS — I2510 Atherosclerotic heart disease of native coronary artery without angina pectoris: Secondary | ICD-10-CM | POA: Diagnosis not present

## 2011-09-30 DIAGNOSIS — E782 Mixed hyperlipidemia: Secondary | ICD-10-CM | POA: Diagnosis not present

## 2011-10-02 DIAGNOSIS — I2510 Atherosclerotic heart disease of native coronary artery without angina pectoris: Secondary | ICD-10-CM | POA: Diagnosis not present

## 2011-10-02 DIAGNOSIS — I442 Atrioventricular block, complete: Secondary | ICD-10-CM | POA: Diagnosis not present

## 2011-11-18 DIAGNOSIS — Z79899 Other long term (current) drug therapy: Secondary | ICD-10-CM | POA: Diagnosis not present

## 2011-11-18 DIAGNOSIS — Z049 Encounter for examination and observation for unspecified reason: Secondary | ICD-10-CM | POA: Diagnosis not present

## 2011-11-18 DIAGNOSIS — G43719 Chronic migraine without aura, intractable, without status migrainosus: Secondary | ICD-10-CM | POA: Diagnosis not present

## 2011-11-18 DIAGNOSIS — R51 Headache: Secondary | ICD-10-CM | POA: Diagnosis not present

## 2011-11-19 DIAGNOSIS — IMO0001 Reserved for inherently not codable concepts without codable children: Secondary | ICD-10-CM | POA: Diagnosis not present

## 2011-11-19 DIAGNOSIS — M542 Cervicalgia: Secondary | ICD-10-CM | POA: Diagnosis not present

## 2011-11-19 DIAGNOSIS — G518 Other disorders of facial nerve: Secondary | ICD-10-CM | POA: Diagnosis not present

## 2011-11-19 DIAGNOSIS — R51 Headache: Secondary | ICD-10-CM | POA: Diagnosis not present

## 2011-12-03 DIAGNOSIS — R51 Headache: Secondary | ICD-10-CM | POA: Diagnosis not present

## 2011-12-03 DIAGNOSIS — IMO0001 Reserved for inherently not codable concepts without codable children: Secondary | ICD-10-CM | POA: Diagnosis not present

## 2011-12-03 DIAGNOSIS — M542 Cervicalgia: Secondary | ICD-10-CM | POA: Diagnosis not present

## 2011-12-03 DIAGNOSIS — G518 Other disorders of facial nerve: Secondary | ICD-10-CM | POA: Diagnosis not present

## 2011-12-09 DIAGNOSIS — Z961 Presence of intraocular lens: Secondary | ICD-10-CM | POA: Diagnosis not present

## 2011-12-09 DIAGNOSIS — H35039 Hypertensive retinopathy, unspecified eye: Secondary | ICD-10-CM | POA: Diagnosis not present

## 2011-12-09 DIAGNOSIS — H35379 Puckering of macula, unspecified eye: Secondary | ICD-10-CM | POA: Diagnosis not present

## 2011-12-09 DIAGNOSIS — H179 Unspecified corneal scar and opacity: Secondary | ICD-10-CM | POA: Diagnosis not present

## 2011-12-09 DIAGNOSIS — H40019 Open angle with borderline findings, low risk, unspecified eye: Secondary | ICD-10-CM | POA: Diagnosis not present

## 2011-12-09 DIAGNOSIS — H353190 Nonexudative age-related macular degeneration, unspecified eye, stage unspecified: Secondary | ICD-10-CM | POA: Diagnosis not present

## 2011-12-16 DIAGNOSIS — G518 Other disorders of facial nerve: Secondary | ICD-10-CM | POA: Diagnosis not present

## 2011-12-16 DIAGNOSIS — R51 Headache: Secondary | ICD-10-CM | POA: Diagnosis not present

## 2011-12-16 DIAGNOSIS — IMO0001 Reserved for inherently not codable concepts without codable children: Secondary | ICD-10-CM | POA: Diagnosis not present

## 2011-12-16 DIAGNOSIS — M542 Cervicalgia: Secondary | ICD-10-CM | POA: Diagnosis not present

## 2011-12-31 DIAGNOSIS — M542 Cervicalgia: Secondary | ICD-10-CM | POA: Diagnosis not present

## 2011-12-31 DIAGNOSIS — R51 Headache: Secondary | ICD-10-CM | POA: Diagnosis not present

## 2011-12-31 DIAGNOSIS — IMO0001 Reserved for inherently not codable concepts without codable children: Secondary | ICD-10-CM | POA: Diagnosis not present

## 2011-12-31 DIAGNOSIS — G518 Other disorders of facial nerve: Secondary | ICD-10-CM | POA: Diagnosis not present

## 2012-01-26 DIAGNOSIS — I2510 Atherosclerotic heart disease of native coronary artery without angina pectoris: Secondary | ICD-10-CM | POA: Diagnosis not present

## 2012-01-26 DIAGNOSIS — E782 Mixed hyperlipidemia: Secondary | ICD-10-CM | POA: Diagnosis not present

## 2012-01-26 DIAGNOSIS — G4733 Obstructive sleep apnea (adult) (pediatric): Secondary | ICD-10-CM | POA: Diagnosis not present

## 2012-02-01 DIAGNOSIS — N3946 Mixed incontinence: Secondary | ICD-10-CM | POA: Diagnosis not present

## 2012-02-01 DIAGNOSIS — N390 Urinary tract infection, site not specified: Secondary | ICD-10-CM | POA: Diagnosis not present

## 2012-02-01 DIAGNOSIS — R300 Dysuria: Secondary | ICD-10-CM | POA: Diagnosis not present

## 2012-02-09 DIAGNOSIS — IMO0001 Reserved for inherently not codable concepts without codable children: Secondary | ICD-10-CM | POA: Diagnosis not present

## 2012-02-09 DIAGNOSIS — G518 Other disorders of facial nerve: Secondary | ICD-10-CM | POA: Diagnosis not present

## 2012-02-09 DIAGNOSIS — G43719 Chronic migraine without aura, intractable, without status migrainosus: Secondary | ICD-10-CM | POA: Diagnosis not present

## 2012-02-09 DIAGNOSIS — M542 Cervicalgia: Secondary | ICD-10-CM | POA: Diagnosis not present

## 2012-02-18 DIAGNOSIS — H6120 Impacted cerumen, unspecified ear: Secondary | ICD-10-CM | POA: Diagnosis not present

## 2012-02-18 DIAGNOSIS — R42 Dizziness and giddiness: Secondary | ICD-10-CM | POA: Diagnosis not present

## 2012-03-09 DIAGNOSIS — R51 Headache: Secondary | ICD-10-CM | POA: Diagnosis not present

## 2012-03-09 DIAGNOSIS — F331 Major depressive disorder, recurrent, moderate: Secondary | ICD-10-CM | POA: Diagnosis not present

## 2012-03-15 DIAGNOSIS — L821 Other seborrheic keratosis: Secondary | ICD-10-CM | POA: Diagnosis not present

## 2012-03-31 DIAGNOSIS — J449 Chronic obstructive pulmonary disease, unspecified: Secondary | ICD-10-CM | POA: Diagnosis not present

## 2012-03-31 DIAGNOSIS — G4733 Obstructive sleep apnea (adult) (pediatric): Secondary | ICD-10-CM | POA: Diagnosis not present

## 2012-03-31 DIAGNOSIS — I447 Left bundle-branch block, unspecified: Secondary | ICD-10-CM | POA: Diagnosis not present

## 2012-03-31 LAB — PACEMAKER DEVICE OBSERVATION

## 2012-04-01 DIAGNOSIS — M26629 Arthralgia of temporomandibular joint, unspecified side: Secondary | ICD-10-CM | POA: Diagnosis not present

## 2012-04-13 DIAGNOSIS — I2789 Other specified pulmonary heart diseases: Secondary | ICD-10-CM | POA: Diagnosis not present

## 2012-04-13 DIAGNOSIS — I059 Rheumatic mitral valve disease, unspecified: Secondary | ICD-10-CM | POA: Diagnosis not present

## 2012-04-13 HISTORY — PX: US ECHOCARDIOGRAPHY: HXRAD669

## 2012-05-12 DIAGNOSIS — I428 Other cardiomyopathies: Secondary | ICD-10-CM | POA: Diagnosis not present

## 2012-05-12 DIAGNOSIS — I2510 Atherosclerotic heart disease of native coronary artery without angina pectoris: Secondary | ICD-10-CM | POA: Diagnosis not present

## 2012-05-13 ENCOUNTER — Other Ambulatory Visit: Payer: Self-pay | Admitting: Cardiovascular Disease

## 2012-05-16 DIAGNOSIS — Z950 Presence of cardiac pacemaker: Secondary | ICD-10-CM | POA: Diagnosis not present

## 2012-05-16 DIAGNOSIS — Z951 Presence of aortocoronary bypass graft: Secondary | ICD-10-CM | POA: Diagnosis not present

## 2012-05-16 DIAGNOSIS — Z01818 Encounter for other preprocedural examination: Secondary | ICD-10-CM | POA: Diagnosis not present

## 2012-05-17 DIAGNOSIS — E559 Vitamin D deficiency, unspecified: Secondary | ICD-10-CM | POA: Diagnosis not present

## 2012-05-17 DIAGNOSIS — D689 Coagulation defect, unspecified: Secondary | ICD-10-CM | POA: Diagnosis not present

## 2012-05-17 DIAGNOSIS — E782 Mixed hyperlipidemia: Secondary | ICD-10-CM | POA: Diagnosis not present

## 2012-05-17 DIAGNOSIS — Z79899 Other long term (current) drug therapy: Secondary | ICD-10-CM | POA: Diagnosis not present

## 2012-05-17 DIAGNOSIS — I2510 Atherosclerotic heart disease of native coronary artery without angina pectoris: Secondary | ICD-10-CM | POA: Diagnosis not present

## 2012-05-18 ENCOUNTER — Encounter (HOSPITAL_COMMUNITY): Payer: Self-pay | Admitting: Pharmacy Technician

## 2012-05-22 HISTORY — PX: CARDIAC CATHETERIZATION: SHX172

## 2012-05-23 ENCOUNTER — Observation Stay (HOSPITAL_COMMUNITY)
Admission: RE | Admit: 2012-05-23 | Discharge: 2012-05-23 | Disposition: A | Discharge status: Home / Self Care | Payer: Medicare Other | Source: Ambulatory Visit | Attending: Cardiovascular Disease | Admitting: Cardiovascular Disease

## 2012-05-23 ENCOUNTER — Encounter (HOSPITAL_COMMUNITY)
Admission: RE | Disposition: A | Discharge status: Home / Self Care | Payer: Self-pay | Source: Ambulatory Visit | Attending: Cardiovascular Disease

## 2012-05-23 DIAGNOSIS — Z951 Presence of aortocoronary bypass graft: Secondary | ICD-10-CM | POA: Insufficient documentation

## 2012-05-23 DIAGNOSIS — Z9861 Coronary angioplasty status: Secondary | ICD-10-CM | POA: Diagnosis not present

## 2012-05-23 DIAGNOSIS — I2510 Atherosclerotic heart disease of native coronary artery without angina pectoris: Secondary | ICD-10-CM | POA: Diagnosis not present

## 2012-05-23 DIAGNOSIS — Z950 Presence of cardiac pacemaker: Secondary | ICD-10-CM | POA: Diagnosis not present

## 2012-05-23 DIAGNOSIS — Z954 Presence of other heart-valve replacement: Secondary | ICD-10-CM | POA: Diagnosis not present

## 2012-05-23 HISTORY — PX: LEFT HEART CATHETERIZATION WITH CORONARY/GRAFT ANGIOGRAM: SHX5450

## 2012-05-23 SURGERY — LEFT HEART CATHETERIZATION WITH CORONARY/GRAFT ANGIOGRAM
Anesthesia: LOCAL

## 2012-05-23 MED ORDER — DIAZEPAM 5 MG PO TABS
5.0000 mg | ORAL_TABLET | ORAL | Status: AC
  Administered 2012-05-23: 5 mg via ORAL

## 2012-05-23 MED ORDER — NITROGLYCERIN 0.2 MG/ML ON CALL CATH LAB
INTRAVENOUS | Status: AC
  Filled 2012-05-23: qty 1

## 2012-05-23 MED ORDER — ASPIRIN 81 MG PO CHEW
CHEWABLE_TABLET | ORAL | Status: AC
  Administered 2012-05-23: 324 mg
  Filled 2012-05-23: qty 4

## 2012-05-23 MED ORDER — FENTANYL CITRATE 0.05 MG/ML IJ SOLN
INTRAMUSCULAR | Status: AC
  Filled 2012-05-23: qty 2

## 2012-05-23 MED ORDER — ONDANSETRON HCL 4 MG/2ML IJ SOLN: 4.0000 mg | Freq: Four times a day (QID) | INTRAMUSCULAR | Status: DC | PRN

## 2012-05-23 MED ORDER — SODIUM CHLORIDE 0.9 % IJ SOLN: 3.0000 mL | INTRAMUSCULAR | Status: DC | PRN

## 2012-05-23 MED ORDER — ACETAMINOPHEN 325 MG PO TABS: 650.0000 mg | ORAL_TABLET | ORAL | Status: DC | PRN

## 2012-05-23 MED ORDER — RANOLAZINE ER 500 MG PO TB12: 500.0000 mg | ORAL_TABLET | Freq: Two times a day (BID) | ORAL | Status: DC

## 2012-05-23 MED ORDER — ISOSORBIDE MONONITRATE ER 30 MG PO TB24
30.0000 mg | ORAL_TABLET | Freq: Every day | ORAL | Status: DC
  Administered 2012-05-23: 30 mg via ORAL
  Filled 2012-05-23: qty 1

## 2012-05-23 MED ORDER — DEXTROSE-NACL 5-0.45 % IV SOLN
INTRAVENOUS | Status: DC
  Administered 2012-05-23: 08:00:00 via INTRAVENOUS

## 2012-05-23 MED ORDER — LIDOCAINE HCL (PF) 1 % IJ SOLN
INTRAMUSCULAR | Status: AC
  Filled 2012-05-23: qty 30

## 2012-05-23 MED ORDER — HEPARIN (PORCINE) IN NACL 2-0.9 UNIT/ML-% IJ SOLN
INTRAMUSCULAR | Status: AC
  Filled 2012-05-23: qty 1000

## 2012-05-23 MED ORDER — MIDAZOLAM HCL 2 MG/2ML IJ SOLN
INTRAMUSCULAR | Status: AC
  Filled 2012-05-23: qty 2

## 2012-05-23 MED ORDER — ASPIRIN 81 MG PO CHEW: 324.0000 mg | CHEWABLE_TABLET | ORAL | Status: DC

## 2012-05-23 MED ORDER — ISOSORBIDE MONONITRATE ER 30 MG PO TB24: 30.0000 mg | ORAL_TABLET | Freq: Every day | ORAL | Status: DC

## 2012-05-23 MED ORDER — DIAZEPAM 5 MG PO TABS
ORAL_TABLET | ORAL | Status: AC
  Filled 2012-05-23: qty 1

## 2012-05-23 MED ORDER — SODIUM CHLORIDE 0.9 % IJ SOLN: 3.0000 mL | Freq: Two times a day (BID) | INTRAMUSCULAR | Status: DC

## 2012-05-23 MED ORDER — SODIUM CHLORIDE 0.9 % IV SOLN
INTRAVENOUS | Status: DC
  Administered 2012-05-23: 11:00:00 via INTRAVENOUS

## 2012-05-23 MED ORDER — SODIUM CHLORIDE 0.9 % IV SOLN: 250.0000 mL | INTRAVENOUS | Status: DC | PRN

## 2012-05-23 NOTE — Cardiovascular Report (Signed)
[name] NO.:  [id number]  MEDICAL RECORD NO.:  [id number]  LOCATION:  [facility]                         FACILITY:  [facility]  PHYSICIAN:  Nicki Guadalajara, M.D.     DATE OF BIRTH:  May 30, 1931  DATE OF PROCEDURE:  05/23/2012 DATE OF DISCHARGE:                           CARDIAC CATHETERIZATION   INDICATIONS:  [name] is a very pleasant, 78 year old female, who has undergone remote intervention to her LAD and intermediate vessel.  In 2003, she underwent CABG revascularization surgery and had a LIMA to the LAD, a vein to the ramus intermediate, and also underwent mitral valve repair with an annuloplasty ring.  She is status post permanent pacemaker.  Her last catheterization April 2012, showed significant native CAD with 80% to 85% distal left main stenosis, 30% stenosis in the LAD after the first diagonal vessel, total occlusion of the ramus intermediate vessel at the site of prior stenting, ostially. She had a small diminutive circumflex vessel.  Right coronary artery was dominant with insignificant 10% to 20% narrowings.  She had a patent LIMA graft to the LAD, but her distal LAD was small caliber vessel.  She had a patent vein graft to a intermediate vessel.  At that time, ejection fraction was in the 45% to 50% range and she had an apical wall motion abnormality.  Subsequently, she has been found to have reduction of LV function noted on echocardiography.  Her most recent echo suggested an EF of approximately 30% (range 25% to 35%) with moderate-to-severe global hypokinesis and it was felt that the apical wall-motion may reflect her pacemaker activity and in addition she did have profound synchrony noted of her septum consistent with her conduction abnormality.  Because of her change in LV function with some symptoms of shortness of breath and some mild chest pain episodes, definitive catheterization was recommended.  PROCEDURE:  After premedication  with Versed 2 mg plus fentanyl 25 mcg, the patient was prepped and draped in usual fashion.  Right femoral artery was punctured anteriorly and a 5-French sheath was inserted without difficulty.  Diagnostic catheterization was done utilizing 5- [place] Judkins 4 left 3.5 and right 4.0 catheters.  The right catheter was used for selective angiography into the vein graft supplying the ramus intermediate vessel.  This was also used for selective angiography into the left internal mammary artery supplying her LAD.  RAO ventriculography was performed utilizing a 5-French pigtail catheter. Quantification of the ejection fraction was also performed. Hemostasis was obtained direct manual pressure.  The patient tolerated the procedure well.  HEMODYNAMIC DATA:  Central aortic pressure was 136/50, left ventricular pressure 136/60, Post A-wave 11.  ANGIOGRAPHIC DATA:  Left main coronary artery had 80%-85% distal stenosis, which was unchanged from previously and trifurcated into an LAD, ramus intermediate vessel and a diminutive left circumflex system.  The LAD now had a new 70% stenosis just after the ramus takeoff proximal to the remotely placed stent.  This was new compared to the patient's last catheterization.  There was a flush and fill phenomena seen in the LAD from competitive filling via the LIMA graft. The ramus intermediate vessel was totally occluded in the previously  placed ostial stent, which was unchanged.  The diminutive circumflex was very small caliber vessel.  That was free of significant disease.  The right coronary artery was a large caliber dominant vessel that had mild 10%-20% luminal irregularity in the mid segment.  The vessel supplied 3 large distal branches including the PDA, PDA 2 and posterolateral vessel.  These were free of significant disease.  The ramus intermediate graft was slightly patent and anastomosed into the ramus intermediate vessel.  Initially, the  vessel appeared small caliber after the anastomosis.  IC nitroglycerin was administered with some dilation of this vessel.  There was no significant stenosis.  The LIMA graft was widely patent and anastomosed into the mid LAD. There was filling back up into the previously placed stent proximally as well as filling the distal LAD.  The apical portion of the LAD was small caliber, without significant stenoses.  RAO ventriculography revealed, moderate LV dysfunction.  The apex did appear to be dyskinetic.  There was no qualitative ejection fraction within the 35% to less than 40% range.  By quantitative analysis, the ejection fraction calculated to 38.9%.  The most significant abnormality is apically and distal anterolaterally.  IMPRESSION: 1. Moderate left ventricular dysfunction with an ejection fraction of     approximately 35% to less than 40%, calculated at 38.9% by     quantitative left ventriculography without any appreciable mitral     regurgitation. 2. Significant native coronary artery disease with previously noted     80% to 85% distal left main stenosis; new 70% ostial stenosis of     the LAD proximal to a previously placed stent, total occlusion of     the ramus intermediate vessel at the site of remote stenting;     diminutive left circumflex coronary artery, without stenosis, large     dominant right coronary artery with mild luminal 10% to 20% mid     irregularities without significant obstructive disease. 3. Patent saphenous vein graft to the ramus intermedius vessel. 4. Patent LIMA graft to the LAD.  DISCUSSION:  [name] does have slight progression of native coronary artery disease with new 70% narrowing just after the ramus intermediate takeoff in the very proximal LAD, which perhaps may be impeding retrograde filling of the small diminutive circumflex vessel.  However, the LIMA is widely patent.  In light of her distal left main disease and ostial LAD lesion,  this will be continued to be treated medically, so as not to jeopardize the LIMA graft.  Her ejection fraction is in the 35% to less than 40% range.  Increased medical therapy initially will be recommended with close followup of LV function.          ______________________________ Nicki Guadalajara, M.D.     TK/MEDQ  D:  05/23/2012  T:  05/23/2012  Job:  [id number]  cc:   Dr. Karin Lieu

## 2012-05-23 NOTE — Progress Notes (Signed)
Discontinued foley catheter placed during cardiac catheterization today. Will wait for patient to void before discharge.

## 2012-05-23 NOTE — CV Procedure (Signed)
Cardiac Catheterization  [name], 78 y.o., female  Full note dictated; see diagram in paper chart  DICTATION # [phone], [id number]  Ao: 136/51 LV: 136/6/11  LM: 80 - 85% old distal stenosis LAD: 70% ostial prior to old stent with "flush and fill" after stent RI: old occlusion in ostial stent LCx: diminutive normal vessel RCA: large dominant with 10 - 20% mid  LV Fxn: qualitative 35% to < 40%;  Quantitative 38.9%  Tolerated well; Will increase nitrate therapy and consider ranexa trial.  Lennette Bihari, MD, [facility] 05/23/2012 10:22 AM

## 2012-05-23 NOTE — H&P (Signed)
UPdated H&P:    See complete dictated note on paper chart from 05/12/2012.  Pt is now admitted for diagnostic cathterization.  She is s/p remote CABG in 2003, h/o permament pacemaker with LBBB. RecentEF has declined from 45% in 09/2010 to 30%.  She has noted dyspnea and some chest discomfort. She has felt improved with re-institution of low dose nitrates.  No change in PEx.  Labs from 05/17/12 reviewed.  Plan cath this am. Pt and husband understand risks/benefits of procedure. Vista Sawatzky A 05/23/2012 8:43 AM

## 2012-05-26 DIAGNOSIS — J209 Acute bronchitis, unspecified: Secondary | ICD-10-CM | POA: Diagnosis not present

## 2012-06-07 DIAGNOSIS — I5032 Chronic diastolic (congestive) heart failure: Secondary | ICD-10-CM | POA: Diagnosis not present

## 2012-06-07 DIAGNOSIS — I2510 Atherosclerotic heart disease of native coronary artery without angina pectoris: Secondary | ICD-10-CM | POA: Diagnosis not present

## 2012-06-07 DIAGNOSIS — G4733 Obstructive sleep apnea (adult) (pediatric): Secondary | ICD-10-CM | POA: Diagnosis not present

## 2012-06-07 DIAGNOSIS — E782 Mixed hyperlipidemia: Secondary | ICD-10-CM | POA: Diagnosis not present

## 2012-06-28 DIAGNOSIS — J209 Acute bronchitis, unspecified: Secondary | ICD-10-CM | POA: Diagnosis not present

## 2012-07-19 DIAGNOSIS — Z1231 Encounter for screening mammogram for malignant neoplasm of breast: Secondary | ICD-10-CM | POA: Diagnosis not present

## 2012-07-21 DIAGNOSIS — E782 Mixed hyperlipidemia: Secondary | ICD-10-CM | POA: Diagnosis not present

## 2012-07-21 DIAGNOSIS — I2510 Atherosclerotic heart disease of native coronary artery without angina pectoris: Secondary | ICD-10-CM | POA: Diagnosis not present

## 2012-07-21 DIAGNOSIS — I119 Hypertensive heart disease without heart failure: Secondary | ICD-10-CM | POA: Diagnosis not present

## 2012-09-02 DIAGNOSIS — N390 Urinary tract infection, site not specified: Secondary | ICD-10-CM | POA: Diagnosis not present

## 2012-09-19 DIAGNOSIS — R32 Unspecified urinary incontinence: Secondary | ICD-10-CM | POA: Diagnosis not present

## 2012-09-28 DIAGNOSIS — Z961 Presence of intraocular lens: Secondary | ICD-10-CM | POA: Diagnosis not present

## 2012-09-28 DIAGNOSIS — H353190 Nonexudative age-related macular degeneration, unspecified eye, stage unspecified: Secondary | ICD-10-CM | POA: Diagnosis not present

## 2012-09-28 DIAGNOSIS — H40019 Open angle with borderline findings, low risk, unspecified eye: Secondary | ICD-10-CM | POA: Diagnosis not present

## 2012-09-28 DIAGNOSIS — H35039 Hypertensive retinopathy, unspecified eye: Secondary | ICD-10-CM | POA: Diagnosis not present

## 2012-10-06 DIAGNOSIS — R531 Weakness: Secondary | ICD-10-CM

## 2012-10-06 DIAGNOSIS — I2510 Atherosclerotic heart disease of native coronary artery without angina pectoris: Secondary | ICD-10-CM | POA: Diagnosis not present

## 2012-10-06 DIAGNOSIS — I509 Heart failure, unspecified: Secondary | ICD-10-CM | POA: Diagnosis not present

## 2012-10-06 DIAGNOSIS — R6889 Other general symptoms and signs: Secondary | ICD-10-CM | POA: Diagnosis not present

## 2012-10-06 DIAGNOSIS — R0602 Shortness of breath: Secondary | ICD-10-CM | POA: Diagnosis not present

## 2012-10-06 DIAGNOSIS — I447 Left bundle-branch block, unspecified: Secondary | ICD-10-CM | POA: Diagnosis not present

## 2012-10-06 DIAGNOSIS — R5381 Other malaise: Secondary | ICD-10-CM | POA: Diagnosis not present

## 2012-10-06 HISTORY — DX: Weakness: R53.1

## 2012-10-06 LAB — PACEMAKER DEVICE OBSERVATION

## 2012-10-11 ENCOUNTER — Encounter: Payer: Self-pay | Admitting: Internal Medicine

## 2012-10-11 ENCOUNTER — Encounter: Payer: Self-pay | Admitting: *Deleted

## 2012-10-11 ENCOUNTER — Ambulatory Visit (INDEPENDENT_AMBULATORY_CARE_PROVIDER_SITE_OTHER): Payer: Medicare Other | Admitting: Internal Medicine

## 2012-10-11 VITALS — BP 129/54 | HR 78 | Ht 62.0 in | Wt 125.0 lb

## 2012-10-11 DIAGNOSIS — I255 Ischemic cardiomyopathy: Secondary | ICD-10-CM

## 2012-10-11 DIAGNOSIS — I2589 Other forms of chronic ischemic heart disease: Secondary | ICD-10-CM

## 2012-10-11 DIAGNOSIS — G4733 Obstructive sleep apnea (adult) (pediatric): Secondary | ICD-10-CM

## 2012-10-11 DIAGNOSIS — I442 Atrioventricular block, complete: Secondary | ICD-10-CM | POA: Diagnosis not present

## 2012-10-11 DIAGNOSIS — I5033 Acute on chronic diastolic (congestive) heart failure: Secondary | ICD-10-CM | POA: Insufficient documentation

## 2012-10-11 DIAGNOSIS — Z950 Presence of cardiac pacemaker: Secondary | ICD-10-CM | POA: Diagnosis not present

## 2012-10-11 DIAGNOSIS — R05 Cough: Secondary | ICD-10-CM

## 2012-10-11 DIAGNOSIS — I5022 Chronic systolic (congestive) heart failure: Secondary | ICD-10-CM | POA: Diagnosis not present

## 2012-10-11 HISTORY — DX: Obstructive sleep apnea (adult) (pediatric): G47.33

## 2012-10-11 HISTORY — DX: Chronic systolic (congestive) heart failure: I50.22

## 2012-10-11 HISTORY — DX: Presence of cardiac pacemaker: Z95.0

## 2012-10-11 HISTORY — DX: Atrioventricular block, complete: I44.2

## 2012-10-11 HISTORY — DX: Ischemic cardiomyopathy: I25.5

## 2012-10-11 NOTE — Progress Notes (Signed)
 ELECTROPHYSIOLOGY CONSULT NOTE  Patient ID: [name], MRN: [id number], DOB/AGE: 10/30/1930 79 y.o. Admit date: (Not on file) Date of Consult: 10/11/2012  Primary Physician: No primary provider on file. Primary Cardiologist:  TK  Chief Complaint: ?CRT   HPI [name] is a 79 y.o. female     She has a history of coronary artery disease with bypass graft in 2003. She had undergone prior stenting. She also  Had mitral valve repair at that time. Because of recurring chest pain in 2012 she underwent catheterization demonstrated a patent LIMA and patent vein graft to the ramus. A large and patent right CA  Repeat catheterization 12/13 demonstrated mild progression of disease with an ejection fraction of 35-40%  She has a history of a pacemaker implanted o 2007. He is a Medtronic and rhythm which has reached ERI  Implant   indication apparently was sick sinus syndrome.  She has a history of coronary artery disease with a catheterization 2013-December demonstrating 80-85% left main and 70% ostial LAD at the point of an old stent in the ramus occlusion and a large dominant right. Ejection fraction was 35-40%  She is here for consideration of CRT upgrade. She has had progressive fatigue over months. She's had no edema but has dyspnea on exertion. There has not been nocturnal dyspnea. This is not improved with Ranexa.  She also has an ongoing problem with cough. It is dry and nonproductive. It is particularly a problem as she has significant incontinence which has been life disruptive and has been congruent with her depression    Past Medical History  Diagnosis Date  . Chronic systolic heart failure 10/11/2012  . Ischemic cardiomyopathy- s/p CABG 10/11/2012    EF 39%  . Complete heart block 10/11/2012  . Pacemaker-Medtronic 10/11/2012  . OSA (obstructive sleep apnea) 10/11/2012  . Stress incontinence     multiple surgeries      surg history  CABG Home Meds: Prior to Admission  medications   Medication Sig Start Date End Date Taking? Authorizing Provider  aspirin EC 81 MG tablet Take 81 mg by mouth daily.    Historical Provider, MD  Calcium Carbonate-Vitamin D (CALCIUM 600/VITAMIN D) 600-400 MG-UNIT per tablet Take 1 tablet by mouth daily.    Historical Provider, MD  diltiazem (CARDIZEM CD) 240 MG 24 hr capsule Take 240 mg by mouth daily.    Historical Provider, MD  ezetimibe (ZETIA) 10 MG tablet Take 10 mg by mouth daily.    Historical Provider, MD  isosorbide mononitrate (IMDUR) 30 MG 24 hr tablet Take 1 tablet (30 mg total) by mouth daily. 05/23/12   Laura Ingold, NP  nebivolol (BYSTOLIC) 2.5 MG tablet Take 2.5 mg by mouth daily.    Historical Provider, MD  omeprazole (PRILOSEC) 20 MG capsule Take 20 mg by mouth daily.    Historical Provider, MD  PARoxetine (PAXIL) 30 MG tablet Take 30 mg by mouth daily.    Historical Provider, MD  polyvinyl alcohol (LIQUIFILM TEARS) 1.4 % ophthalmic solution Place 1 drop into both eyes 2 (two) times daily.    Historical Provider, MD  rosuvastatin (CRESTOR) 5 MG tablet Take 2.5 mg by mouth daily.    Historical Provider, MD  valsartan-hydrochlorothiazide (DIOVAN-HCT) 320-12.5 MG per tablet Take 1 tablet by mouth daily.    Historical Provider, MD    Allergies:  Allergies  Allergen Reactions  . Dilaudid (Hydromorphone Hcl) Other (See Comments)    "jerking"  . Morphine And Related Other (  See Comments)    "jerking"  . Sulfa Antibiotics Other (See Comments)    unknown    History   Social History  . Marital Status: Married    Spouse Name: N/A    Number of Children: N/A  . Years of Education: N/A   Occupational History  . Not on file.   Social History Main Topics  . Smoking status: Never Smoker   . Smokeless tobacco: Not on file  . Alcohol Use: Not on file  . Drug Use: No  . Sexually Active: Not on file   Other Topics Concern  . Not on file   Social History Narrative  . No narrative on file     No family history  on file.   ROS:  Please see the history of present illness.   Negative except anemia  All other systems reviewed and negative.    Physical Exam:   BP 129/54  Pulse 78  Ht 5' 2" (1.575 m)  Wt 125 lb (56.7 kg)  BMI 22.86 kg/m2 General: Well developed, well nourished younger than age appearing female in no acute distress. Head: Normocephalic, atraumatic, sclera non-icteric, no xanthomas, nares are without discharge. EENT: normal Lymph Nodes:  none Back: with mild kyphosis , no CVA tendersness Neck: Negative for carotid bruits. JVD not elevated. Lungs: Clear bilaterally to auscultation without wheezes, rales, or rhonchi. Breathing is unlabored. Heart: RRR with S1 S2. No   murmur , rubs, or gallops appreciated. Abdomen: Soft, non-tender, non-distended with normoactive bowel sounds. No hepatomegaly. No rebound/guarding. No obvious abdominal masses. Msk:  Strength and tone appear normal for age. Extremities: No clubbing or cyanosis. No    edema.  Distal pedal pulses are 2+ and equal bilaterally. Skin: Warm and Dry ; no superficial veins Neuro: Alert and oriented X 3. CN III-XII intact Grossly normal sensory and motor function . Psych:  Responds to questions appropriately with a normal affect.      Labs: Cardiac Enzymes No results found for this basename: CKTOTAL, CKMB, TROPONINI,  in the last 72 hours CBC Lab Results  Component Value Date   WBC 4.7 10/10/2010   HGB 9.1* 10/10/2010   HCT 28.2* 10/10/2010   MCV 84.9 10/10/2010   PLT 152 10/10/2010     Radiology/Studies:  No results found.  EKG:  AV pacing  Assessment and Plan:   [name]   

## 2012-10-11 NOTE — Assessment & Plan Note (Signed)
We will ask her to have her CPAP card red

## 2012-10-11 NOTE — Assessment & Plan Note (Signed)
The patient has class 2-3 heart failure. This is despite maximal medical therapy. She is device dependent and ventricularly paced with a broad QRS. It is reasonable to consider, particularly issue is approaching ERI, to upgrade her device to CRT.   We discussed potential benefits and risks including but not limited to nonresponse, diaphragmatic stimulation, infection, perforation and lead dislodgment. They understand these risks and are willing to proceed. Her ejection fraction was near 40% so we will change her device from doing pacer to CRT pacer. We will wait for a few weeks and hope that the quad lead from [facility]. Jude been approved is   on the shelf

## 2012-10-11 NOTE — Assessment & Plan Note (Signed)
As above I wonder whether her cough is related to her ARB. It may also be related to reflux. We will start off with trying her on a PPI. In the event that this is not addressed at, I would stop her ARB and at hydralazine to her nitrates. I will discuss this with Dr. Wonda Cheng

## 2012-10-11 NOTE — Assessment & Plan Note (Signed)
As above.

## 2012-10-11 NOTE — Patient Instructions (Addendum)
Your physician recommends that you return for lab work in: 10-26-12   BMP and CBC

## 2012-10-11 NOTE — Assessment & Plan Note (Signed)
The patient's device was interrogated.  The information was reviewed. No changes were made in the programming.    

## 2012-10-19 ENCOUNTER — Telehealth: Payer: Self-pay | Admitting: *Deleted

## 2012-10-19 NOTE — Telephone Encounter (Signed)
I spoke with the patient and she has r/s her procedure date to 10/31/12.

## 2012-10-19 NOTE — Telephone Encounter (Signed)
I have left a message for the patient to call at her home #. Spoke with patient's spouse. I need to reschedule the patient's Bi-V upgrade to another date from 5/14 due to MD schedule change.

## 2012-10-24 ENCOUNTER — Encounter: Payer: Self-pay | Admitting: Internal Medicine

## 2012-10-26 ENCOUNTER — Ambulatory Visit (INDEPENDENT_AMBULATORY_CARE_PROVIDER_SITE_OTHER): Payer: Medicare Other | Admitting: Internal Medicine

## 2012-10-26 DIAGNOSIS — I442 Atrioventricular block, complete: Secondary | ICD-10-CM

## 2012-10-26 LAB — CBC WITH DIFFERENTIAL/PLATELET
Basophils Absolute: 0 10*3/uL (ref 0.0–0.1)
Eosinophils Absolute: 0.1 10*3/uL (ref 0.0–0.7)
Lymphocytes Relative: 17.6 % (ref 12.0–46.0)
MCHC: 34.7 g/dL (ref 30.0–36.0)
Monocytes Relative: 8.5 % (ref 3.0–12.0)
Neutrophils Relative %: 71.8 % (ref 43.0–77.0)
Platelets: 133 10*3/uL — ABNORMAL LOW (ref 150.0–400.0)
RDW: 14.5 % (ref 11.5–14.6)

## 2012-10-26 LAB — BASIC METABOLIC PANEL
BUN: 15 mg/dL (ref 6–23)
CO2: 32 mEq/L (ref 19–32)
Calcium: 9.4 mg/dL (ref 8.4–10.5)
Creatinine, Ser: 0.9 mg/dL (ref 0.4–1.2)
GFR: 64.01 mL/min (ref >60.00)
Glucose, Bld: 85 mg/dL (ref 70–99)
Sodium: 131 mEq/L — ABNORMAL LOW (ref 135–145)

## 2012-10-31 ENCOUNTER — Encounter (HOSPITAL_COMMUNITY)
Admission: RE | Disposition: A | Discharge status: Home / Self Care | Payer: Self-pay | Source: Ambulatory Visit | Attending: Internal Medicine

## 2012-10-31 ENCOUNTER — Telehealth: Payer: Self-pay | Admitting: Internal Medicine

## 2012-10-31 ENCOUNTER — Ambulatory Visit (HOSPITAL_COMMUNITY)
Admission: RE | Admit: 2012-10-31 | Discharge: 2012-10-31 | Disposition: A | Discharge status: Home / Self Care | Payer: Medicare Other | Source: Ambulatory Visit | Attending: Internal Medicine | Admitting: Internal Medicine

## 2012-10-31 DIAGNOSIS — I495 Sick sinus syndrome: Secondary | ICD-10-CM | POA: Diagnosis not present

## 2012-10-31 DIAGNOSIS — Z885 Allergy status to narcotic agent status: Secondary | ICD-10-CM | POA: Diagnosis not present

## 2012-10-31 DIAGNOSIS — N393 Stress incontinence (female) (male): Secondary | ICD-10-CM | POA: Diagnosis not present

## 2012-10-31 DIAGNOSIS — I5022 Chronic systolic (congestive) heart failure: Secondary | ICD-10-CM | POA: Insufficient documentation

## 2012-10-31 DIAGNOSIS — Z538 Procedure and treatment not carried out for other reasons: Secondary | ICD-10-CM | POA: Diagnosis not present

## 2012-10-31 DIAGNOSIS — I2589 Other forms of chronic ischemic heart disease: Secondary | ICD-10-CM | POA: Diagnosis not present

## 2012-10-31 DIAGNOSIS — I2510 Atherosclerotic heart disease of native coronary artery without angina pectoris: Secondary | ICD-10-CM | POA: Insufficient documentation

## 2012-10-31 DIAGNOSIS — G4733 Obstructive sleep apnea (adult) (pediatric): Secondary | ICD-10-CM | POA: Diagnosis not present

## 2012-10-31 DIAGNOSIS — I509 Heart failure, unspecified: Secondary | ICD-10-CM | POA: Insufficient documentation

## 2012-10-31 DIAGNOSIS — Z79899 Other long term (current) drug therapy: Secondary | ICD-10-CM | POA: Diagnosis not present

## 2012-10-31 DIAGNOSIS — Z7982 Long term (current) use of aspirin: Secondary | ICD-10-CM | POA: Diagnosis not present

## 2012-10-31 DIAGNOSIS — Z882 Allergy status to sulfonamides status: Secondary | ICD-10-CM | POA: Diagnosis not present

## 2012-10-31 HISTORY — PX: BI-VENTRICULAR PACEMAKER UPGRADE: SHX5464

## 2012-10-31 LAB — SURGICAL PCR SCREEN: MRSA, PCR: NEGATIVE

## 2012-10-31 SURGERY — BI-VENTRICULAR PACEMAKER UPGRADE
Anesthesia: LOCAL

## 2012-10-31 MED ORDER — SODIUM CHLORIDE 0.9 % IV SOLN
INTRAVENOUS | Status: DC
  Administered 2012-10-31: 08:00:00 via INTRAVENOUS

## 2012-10-31 MED ORDER — LIDOCAINE HCL (PF) 1 % IJ SOLN
INTRAMUSCULAR | Status: AC
  Filled 2012-10-31: qty 60

## 2012-10-31 MED ORDER — SODIUM CHLORIDE 0.9 % IR SOLN
80.0000 mg | Status: DC
  Filled 2012-10-31: qty 2

## 2012-10-31 MED ORDER — CHLORHEXIDINE GLUCONATE 4 % EX LIQD
60.0000 mL | Freq: Once | CUTANEOUS | Status: DC
Start: 2012-10-31 — End: 2012-10-31
  Filled 2012-10-31: qty 60

## 2012-10-31 MED ORDER — MUPIROCIN 2 % EX OINT
TOPICAL_OINTMENT | Freq: Two times a day (BID) | CUTANEOUS | Status: DC
  Administered 2012-10-31: 1 via NASAL
  Filled 2012-10-31: qty 22

## 2012-10-31 MED ORDER — HEPARIN (PORCINE) IN NACL 2-0.9 UNIT/ML-% IJ SOLN
INTRAMUSCULAR | Status: AC
  Filled 2012-10-31: qty 500

## 2012-10-31 MED ORDER — CEFAZOLIN SODIUM-DEXTROSE 2-3 GM-% IV SOLR
2.0000 g | INTRAVENOUS | Status: DC
  Filled 2012-10-31: qty 50

## 2012-10-31 NOTE — Interval H&P Note (Signed)
History and Physical Interval Note:  10/31/2012 9:58 AM  [name]  has presented today for surgery, with the diagnosis of Ischemic cardiomy.  The various methods of treatment have been discussed with the patient and family. After consideration of risks, benefits and other options for treatment, the patient has consented to  Procedure(s): BI-VENTRICULAR PACEMAKER UPGRADE (N/A) as a surgical intervention .  The patient's history has been reviewed, patient examined, no change in status, stable for surgery.  I have reviewed the patient's chart and labs.  Questions were answered to the patient's satisfaction.     [name]  ongoing problems with weakness and fatigue with sob On exam her device pocket is quite tender which she has noted to be new and worsening over the last few weeks.  There is some tethering on the medial aspect of the wound where there is a second linear scar the source of which she is unaware  We were going to use an antimicrobial pouch and culture the pocket but the former is not available so we will defer the procedure Reviewed with CAth lab director

## 2012-10-31 NOTE — Telephone Encounter (Signed)
New problem    Please call pt w/appt time for tomorrow per dr Graciela Husbands

## 2012-10-31 NOTE — Progress Notes (Signed)
Procedure cancelled per Dr. Graciela Husbands, pt informed.

## 2012-10-31 NOTE — H&P (View-Only) (Signed)
ELECTROPHYSIOLOGY CONSULT NOTE  Patient ID: [name], MRN: [id number], DOB/AGE: 1930/09/08 79 y.o. Admit date: (Not on file) Date of Consult: 10/11/2012  Primary Physician: No primary provider on file. Primary Cardiologist:  TK  Chief Complaint: ?CRT   HPI [name] is a 79 y.o. female     She has a history of coronary artery disease with bypass graft in 2003. She had undergone prior stenting. She also  Had mitral valve repair at that time. Because of recurring chest pain in 2012 she underwent catheterization demonstrated a patent LIMA and patent vein graft to the ramus. A large and patent right CA  Repeat catheterization 12/13 demonstrated mild progression of disease with an ejection fraction of 35-40%  She has a history of a pacemaker implanted o 2007. He is a Medtronic and rhythm which has reached ERI  Implant   indication apparently was sick sinus syndrome.  She has a history of coronary artery disease with a catheterization 2013-December demonstrating 80-85% left main and 70% ostial LAD at the point of an old stent in the ramus occlusion and a large dominant right. Ejection fraction was 35-40%  She is here for consideration of CRT upgrade. She has had progressive fatigue over months. She's had no edema but has dyspnea on exertion. There has not been nocturnal dyspnea. This is not improved with Ranexa.  She also has an ongoing problem with cough. It is dry and nonproductive. It is particularly a problem as she has significant incontinence which has been life disruptive and has been congruent with her depression    Past Medical History  Diagnosis Date  . Chronic systolic heart failure 10/11/2012  . Ischemic cardiomyopathy- s/p CABG 10/11/2012    EF 39%  . Complete heart block 10/11/2012  . Pacemaker-Medtronic 10/11/2012  . OSA (obstructive sleep apnea) 10/11/2012  . Stress incontinence     multiple surgeries      surg history  CABG Home Meds: Prior to Admission  medications   Medication Sig Start Date End Date Taking? Authorizing Provider  aspirin EC 81 MG tablet Take 81 mg by mouth daily.    Historical Provider, MD  Calcium Carbonate-Vitamin D (CALCIUM 600/VITAMIN D) 600-400 MG-UNIT per tablet Take 1 tablet by mouth daily.    Historical Provider, MD  diltiazem (CARDIZEM CD) 240 MG 24 hr capsule Take 240 mg by mouth daily.    Historical Provider, MD  ezetimibe (ZETIA) 10 MG tablet Take 10 mg by mouth daily.    Historical Provider, MD  isosorbide mononitrate (IMDUR) 30 MG 24 hr tablet Take 1 tablet (30 mg total) by mouth daily. 05/23/12   Nada Boozer, NP  nebivolol (BYSTOLIC) 2.5 MG tablet Take 2.5 mg by mouth daily.    Historical Provider, MD  omeprazole (PRILOSEC) 20 MG capsule Take 20 mg by mouth daily.    Historical Provider, MD  PARoxetine (PAXIL) 30 MG tablet Take 30 mg by mouth daily.    Historical Provider, MD  polyvinyl alcohol (LIQUIFILM TEARS) 1.4 % ophthalmic solution Place 1 drop into both eyes 2 (two) times daily.    Historical Provider, MD  rosuvastatin (CRESTOR) 5 MG tablet Take 2.5 mg by mouth daily.    Historical Provider, MD  valsartan-hydrochlorothiazide (DIOVAN-HCT) 320-12.5 MG per tablet Take 1 tablet by mouth daily.    Historical Provider, MD    Allergies:  Allergies  Allergen Reactions  . Dilaudid (Hydromorphone Hcl) Other (See Comments)    "jerking"  . Morphine And Related Other (  See Comments)    "jerking"  . Sulfa Antibiotics Other (See Comments)    unknown    History   Social History  . Marital Status: Married    Spouse Name: N/A    Number of Children: N/A  . Years of Education: N/A   Occupational History  . Not on file.   Social History Main Topics  . Smoking status: Never Smoker   . Smokeless tobacco: Not on file  . Alcohol Use: Not on file  . Drug Use: No  . Sexually Active: Not on file   Other Topics Concern  . Not on file   Social History Narrative  . No narrative on file     No family history  on file.   ROS:  Please see the history of present illness.   Negative except anemia  All other systems reviewed and negative.    Physical Exam:   BP 129/54  Pulse 78  Ht 5\' 2"  (1.575 m)  Wt 125 lb (56.7 kg)  BMI 22.86 kg/m2 General: Well developed, well nourished younger than age appearing female in no acute distress. Head: Normocephalic, atraumatic, sclera non-icteric, no xanthomas, nares are without discharge. EENT: normal Lymph Nodes:  none Back: with mild kyphosis , no CVA tendersness Neck: Negative for carotid bruits. JVD not elevated. Lungs: Clear bilaterally to auscultation without wheezes, rales, or rhonchi. Breathing is unlabored. Heart: RRR with S1 S2. No   murmur , rubs, or gallops appreciated. Abdomen: Soft, non-tender, non-distended with normoactive bowel sounds. No hepatomegaly. No rebound/guarding. No obvious abdominal masses. Msk:  Strength and tone appear normal for age. Extremities: No clubbing or cyanosis. No    edema.  Distal pedal pulses are 2+ and equal bilaterally. Skin: Warm and Dry ; no superficial veins Neuro: Alert and oriented X 3. CN III-XII intact Grossly normal sensory and motor function . Psych:  Responds to questions appropriately with a normal affect.      Labs: Cardiac Enzymes No results found for this basename: CKTOTAL, CKMB, TROPONINI,  in the last 72 hours CBC Lab Results  Component Value Date   WBC 4.7 10/10/2010   HGB 9.1* 10/10/2010   HCT 28.2* 10/10/2010   MCV 84.9 10/10/2010   PLT 152 10/10/2010     Radiology/Studies:  No results found.  EKG:  AV pacing  Assessment and Plan:   [name]

## 2012-11-03 ENCOUNTER — Ambulatory Visit (INDEPENDENT_AMBULATORY_CARE_PROVIDER_SITE_OTHER): Payer: Medicare Other | Admitting: Internal Medicine

## 2012-11-03 ENCOUNTER — Encounter: Payer: Self-pay | Admitting: *Deleted

## 2012-11-03 VITALS — BP 120/52 | HR 64 | Ht 62.0 in | Wt 122.6 lb

## 2012-11-03 DIAGNOSIS — I509 Heart failure, unspecified: Secondary | ICD-10-CM

## 2012-11-03 DIAGNOSIS — I255 Ischemic cardiomyopathy: Secondary | ICD-10-CM

## 2012-11-03 DIAGNOSIS — I2589 Other forms of chronic ischemic heart disease: Secondary | ICD-10-CM

## 2012-11-03 LAB — PACEMAKER DEVICE OBSERVATION
ATRIAL PACING PM: 62.41
BATTERY VOLTAGE: 2.93 V
VENTRICULAR PACING PM: 99.99

## 2012-11-03 NOTE — Progress Notes (Signed)
Seen with Dr Leonia Reeves  To look at site with pain and tethering  Shares concerns, but is not inclined to deviate from the plan of upgrade  I will use a pouch and culture

## 2012-11-03 NOTE — Patient Instructions (Signed)
Your physician has recommended that you have an upgrade to a Bi-Ventricular pacemaker. Please see the instruction sheet given to you today for more information.

## 2012-11-04 ENCOUNTER — Encounter: Payer: Medicare Other | Admitting: Internal Medicine

## 2012-11-05 DIAGNOSIS — H6690 Otitis media, unspecified, unspecified ear: Secondary | ICD-10-CM | POA: Diagnosis not present

## 2012-11-05 DIAGNOSIS — J019 Acute sinusitis, unspecified: Secondary | ICD-10-CM | POA: Diagnosis not present

## 2012-11-05 DIAGNOSIS — J209 Acute bronchitis, unspecified: Secondary | ICD-10-CM | POA: Diagnosis not present

## 2012-11-08 ENCOUNTER — Other Ambulatory Visit: Payer: Self-pay

## 2012-11-08 MED ORDER — EZETIMIBE 10 MG PO TABS: 10.0000 mg | ORAL_TABLET | Freq: Every day | ORAL | Status: DC

## 2012-11-08 NOTE — Telephone Encounter (Signed)
Rx was sent to pharmacy electronically. 

## 2012-11-09 DIAGNOSIS — J209 Acute bronchitis, unspecified: Secondary | ICD-10-CM | POA: Diagnosis not present

## 2012-11-14 ENCOUNTER — Encounter: Payer: Self-pay | Admitting: Internal Medicine

## 2012-11-14 DIAGNOSIS — I2589 Other forms of chronic ischemic heart disease: Secondary | ICD-10-CM | POA: Diagnosis not present

## 2012-11-14 DIAGNOSIS — I5022 Chronic systolic (congestive) heart failure: Secondary | ICD-10-CM | POA: Diagnosis not present

## 2012-11-14 DIAGNOSIS — I442 Atrioventricular block, complete: Secondary | ICD-10-CM | POA: Diagnosis not present

## 2012-11-16 ENCOUNTER — Encounter (HOSPITAL_COMMUNITY): Payer: Self-pay | Admitting: Pharmacy Technician

## 2012-11-17 DIAGNOSIS — Z0000 Encounter for general adult medical examination without abnormal findings: Secondary | ICD-10-CM | POA: Diagnosis not present

## 2012-11-17 DIAGNOSIS — J069 Acute upper respiratory infection, unspecified: Secondary | ICD-10-CM | POA: Diagnosis not present

## 2012-11-21 ENCOUNTER — Encounter (HOSPITAL_COMMUNITY): Payer: Self-pay | Admitting: General Practice

## 2012-11-21 ENCOUNTER — Ambulatory Visit (HOSPITAL_COMMUNITY)
Admission: RE | Admit: 2012-11-21 | Discharge: 2012-11-22 | Disposition: A | Discharge status: Home / Self Care | Payer: Medicare Other | Source: Ambulatory Visit | Attending: Internal Medicine | Admitting: Internal Medicine

## 2012-11-21 ENCOUNTER — Encounter (HOSPITAL_COMMUNITY)
Admission: RE | Disposition: A | Discharge status: Home / Self Care | Payer: Self-pay | Source: Ambulatory Visit | Attending: Internal Medicine

## 2012-11-21 ENCOUNTER — Ambulatory Visit (HOSPITAL_COMMUNITY): Payer: Medicare Other

## 2012-11-21 DIAGNOSIS — G4733 Obstructive sleep apnea (adult) (pediatric): Secondary | ICD-10-CM | POA: Insufficient documentation

## 2012-11-21 DIAGNOSIS — Z45018 Encounter for adjustment and management of other part of cardiac pacemaker: Secondary | ICD-10-CM | POA: Diagnosis not present

## 2012-11-21 DIAGNOSIS — Z954 Presence of other heart-valve replacement: Secondary | ICD-10-CM | POA: Diagnosis not present

## 2012-11-21 DIAGNOSIS — I255 Ischemic cardiomyopathy: Secondary | ICD-10-CM

## 2012-11-21 DIAGNOSIS — Z950 Presence of cardiac pacemaker: Secondary | ICD-10-CM | POA: Diagnosis not present

## 2012-11-21 DIAGNOSIS — Z951 Presence of aortocoronary bypass graft: Secondary | ICD-10-CM | POA: Insufficient documentation

## 2012-11-21 DIAGNOSIS — I509 Heart failure, unspecified: Secondary | ICD-10-CM | POA: Insufficient documentation

## 2012-11-21 DIAGNOSIS — R05 Cough: Secondary | ICD-10-CM

## 2012-11-21 DIAGNOSIS — I2589 Other forms of chronic ischemic heart disease: Secondary | ICD-10-CM | POA: Insufficient documentation

## 2012-11-21 DIAGNOSIS — I442 Atrioventricular block, complete: Secondary | ICD-10-CM | POA: Insufficient documentation

## 2012-11-21 DIAGNOSIS — I5022 Chronic systolic (congestive) heart failure: Secondary | ICD-10-CM | POA: Insufficient documentation

## 2012-11-21 DIAGNOSIS — I2510 Atherosclerotic heart disease of native coronary artery without angina pectoris: Secondary | ICD-10-CM | POA: Diagnosis not present

## 2012-11-21 DIAGNOSIS — Z79899 Other long term (current) drug therapy: Secondary | ICD-10-CM | POA: Diagnosis not present

## 2012-11-21 HISTORY — DX: Personal history of other diseases of the digestive system: Z87.19

## 2012-11-21 HISTORY — DX: Angina pectoris, unspecified: I20.9

## 2012-11-21 HISTORY — PX: BI-VENTRICULAR PACEMAKER UPGRADE: SHX5464

## 2012-11-21 HISTORY — PX: BI-VENTRICULAR PACEMAKER REVISION (CRT-R): SHX5751

## 2012-11-21 HISTORY — DX: Anemia, unspecified: D64.9

## 2012-11-21 HISTORY — DX: Atherosclerotic heart disease of native coronary artery without angina pectoris: I25.10

## 2012-11-21 HISTORY — DX: Gastro-esophageal reflux disease without esophagitis: K21.9

## 2012-11-21 HISTORY — DX: Essential (primary) hypertension: I10

## 2012-11-21 HISTORY — DX: Shortness of breath: R06.02

## 2012-11-21 SURGERY — BI-VENTRICULAR PACEMAKER UPGRADE
Anesthesia: LOCAL

## 2012-11-21 MED ORDER — CEFAZOLIN SODIUM 1-5 GM-% IV SOLN
1.0000 g | Freq: Four times a day (QID) | INTRAVENOUS | Status: AC
  Administered 2012-11-21 – 2012-11-22 (×3): 1 g via INTRAVENOUS
  Filled 2012-11-21 (×4): qty 50

## 2012-11-21 MED ORDER — ACETAMINOPHEN 325 MG PO TABS: 325.0000 mg | ORAL_TABLET | ORAL | Status: DC | PRN

## 2012-11-21 MED ORDER — CEFAZOLIN SODIUM-DEXTROSE 2-3 GM-% IV SOLR
2.0000 g | INTRAVENOUS | Status: DC
  Filled 2012-11-21: qty 50

## 2012-11-21 MED ORDER — VALSARTAN-HYDROCHLOROTHIAZIDE 320-25 MG PO TABS: 1.0000 | ORAL_TABLET | Freq: Every day | ORAL | Status: DC

## 2012-11-21 MED ORDER — LIDOCAINE HCL (PF) 1 % IJ SOLN
INTRAMUSCULAR | Status: AC
  Filled 2012-11-21: qty 60

## 2012-11-21 MED ORDER — NEBIVOLOL HCL 5 MG PO TABS
5.0000 mg | ORAL_TABLET | Freq: Every day | ORAL | Status: DC
  Administered 2012-11-21: 5 mg via ORAL
  Filled 2012-11-21 (×2): qty 1

## 2012-11-21 MED ORDER — EZETIMIBE 10 MG PO TABS
10.0000 mg | ORAL_TABLET | Freq: Every day | ORAL | Status: DC
  Filled 2012-11-21: qty 1

## 2012-11-21 MED ORDER — SODIUM CHLORIDE 0.9 % IR SOLN
80.0000 mg | Status: DC
  Filled 2012-11-21: qty 2

## 2012-11-21 MED ORDER — ALBUTEROL SULFATE HFA 108 (90 BASE) MCG/ACT IN AERS
2.0000 | INHALATION_SPRAY | Freq: Four times a day (QID) | RESPIRATORY_TRACT | Status: DC | PRN
  Filled 2012-11-21: qty 6.7

## 2012-11-21 MED ORDER — ISOSORBIDE MONONITRATE ER 30 MG PO TB24
30.0000 mg | ORAL_TABLET | Freq: Every day | ORAL | Status: DC
  Administered 2012-11-21: 15:00:00 30 mg via ORAL
  Filled 2012-11-21 (×2): qty 1

## 2012-11-21 MED ORDER — CHLORHEXIDINE GLUCONATE 4 % EX LIQD: 60.0000 mL | Freq: Once | CUTANEOUS | Status: DC

## 2012-11-21 MED ORDER — FENTANYL CITRATE 0.05 MG/ML IJ SOLN
INTRAMUSCULAR | Status: AC
  Filled 2012-11-21: qty 2

## 2012-11-21 MED ORDER — PAROXETINE HCL 30 MG PO TABS
30.0000 mg | ORAL_TABLET | Freq: Every day | ORAL | Status: DC
  Administered 2012-11-21: 30 mg via ORAL
  Filled 2012-11-21 (×2): qty 1

## 2012-11-21 MED ORDER — HYDROCHLOROTHIAZIDE 25 MG PO TABS
25.0000 mg | ORAL_TABLET | Freq: Every day | ORAL | Status: DC
  Filled 2012-11-21 (×2): qty 1

## 2012-11-21 MED ORDER — ASPIRIN EC 81 MG PO TBEC
81.0000 mg | DELAYED_RELEASE_TABLET | Freq: Every day | ORAL | Status: DC
  Administered 2012-11-21: 81 mg via ORAL
  Filled 2012-11-21 (×2): qty 1

## 2012-11-21 MED ORDER — HEPARIN (PORCINE) IN NACL 2-0.9 UNIT/ML-% IJ SOLN
INTRAMUSCULAR | Status: AC
  Filled 2012-11-21: qty 500

## 2012-11-21 MED ORDER — VALSARTAN 160 MG PO TABS
320.0000 mg | ORAL_TABLET | Freq: Every day | ORAL | Status: DC
  Filled 2012-11-21 (×2): qty 2

## 2012-11-21 MED ORDER — CEFAZOLIN SODIUM-DEXTROSE 2-3 GM-% IV SOLR
INTRAVENOUS | Status: AC
  Filled 2012-11-21: qty 50

## 2012-11-21 MED ORDER — MIDAZOLAM HCL 5 MG/5ML IJ SOLN
INTRAMUSCULAR | Status: AC
  Filled 2012-11-21: qty 5

## 2012-11-21 MED ORDER — SODIUM CHLORIDE 0.9 % IV SOLN: INTRAVENOUS | Status: DC

## 2012-11-21 MED ORDER — SODIUM CHLORIDE 0.9 % IV SOLN
INTRAVENOUS | Status: AC
  Administered 2012-11-21: 13:00:00 via INTRAVENOUS

## 2012-11-21 MED ORDER — POLYVINYL ALCOHOL 1.4 % OP SOLN
1.0000 [drp] | Freq: Two times a day (BID) | OPHTHALMIC | Status: DC
  Administered 2012-11-21: 14:00:00 1 [drp] via OPHTHALMIC
  Filled 2012-11-21 (×2): qty 15

## 2012-11-21 MED ORDER — SODIUM CHLORIDE 0.9 % IV SOLN
INTRAVENOUS | Status: DC
  Administered 2012-11-21: 09:00:00 via INTRAVENOUS

## 2012-11-21 MED ORDER — ONDANSETRON HCL 4 MG/2ML IJ SOLN: 4.0000 mg | Freq: Four times a day (QID) | INTRAMUSCULAR | Status: DC | PRN

## 2012-11-21 MED ORDER — DOXYCYCLINE HYCLATE 100 MG PO TABS
100.0000 mg | ORAL_TABLET | Freq: Two times a day (BID) | ORAL | Status: DC
  Administered 2012-11-21 (×2): 100 mg via ORAL
  Filled 2012-11-21 (×4): qty 1

## 2012-11-21 MED ORDER — EZETIMIBE 10 MG PO TABS: 10.0000 mg | ORAL_TABLET | Freq: Every day | ORAL | Status: DC

## 2012-11-21 NOTE — H&P (Signed)
ELECTROPHYSIOLOGY HISTORY & PHYSICAL  Patient ID: [name] MRN: [id number], DOB/AGE: 10-16-1930   Admit date: 11/21/2012  Primary Physician: Keturah Barre, MD Primary Cardiologist / EP: Tresa Endo, MD / Graciela Husbands, MD Reason for Admission: BiV PPM upgrade  History of Present Illness [name] is an 79 year old woman who presents today for BiV PPM upgrade. She has a history of coronary artery disease with bypass graft in 2003. She had undergone prior stenting. She also had mitral valve repair at that time. Because of recurring chest pain in 2012 she underwent catheterization demonstrated native 80-85% left main and 70% ostial LAD at the point of an old stent, ramus occlusion and a large dominant RCA. Patent LIMA to LAD and patent SVG to ramus. Ejection fraction was 35-40%. Repeat catheterization December 2013 demonstrated mild progression of disease with an ejection fraction of 35-40%. She underwent pacemaker implant for sinus node dysfunction in 2007. She has subsequently developed CHB and is PPM dependent. Her Medtronic Enrhythm battery has reached ERI. She is here for CRT upgrade as recommended by Dr. Graciela Husbands.   Today she reports she is feeling well. She has recovered from recent bronchitis without fever or chills. She continues to have fatigue "no energy" but otherwise denies complaints. She specifically denies CP, SOB or palpitations. She denies dizziness, near syncope or syncope. She denies LE swelling, orthopnea or PND.   Past Medical History Past Medical History  Diagnosis Date  . Chronic systolic heart failure 10/11/2012  . Ischemic cardiomyopathy- s/p CABG 10/11/2012    EF 39%  . Complete heart block 10/11/2012  . Pacemaker-Medtronic 10/11/2012  . OSA (obstructive sleep apnea) 10/11/2012  . Stress incontinence     multiple surgeries    Past Surgical History No past surgical history on file.   Allergies/Intolerances Allergies  Allergen Reactions  . Dilaudid (Hydromorphone Hcl) Other  (See Comments)    "jerking"  . Morphine And Related Other (See Comments)    "jerking"  . Sulfa Antibiotics Other (See Comments)    unknown   Inpatient Medications   Medication List    ASK your doctor about these medications       albuterol 108 (90 BASE) MCG/ACT inhaler  Commonly known as:  PROVENTIL HFA;VENTOLIN HFA  Inhale 2 puffs into the lungs every 6 (six) hours as needed for wheezing.     aspirin EC 81 MG tablet  Take 81 mg by mouth daily.     CALCIUM 600/VITAMIN D 600-400 MG-UNIT per tablet  Generic drug:  Calcium Carbonate-Vitamin D  Take 1 tablet by mouth daily.     doxycycline 100 MG tablet  Commonly known as:  VIBRA-TABS  Take 100 mg by mouth 2 (two) times daily. On course for bronchitis. To finish on 11/21/12     ezetimibe 10 MG tablet  Commonly known as:  ZETIA  Take 1 tablet (10 mg total) by mouth daily.     isosorbide mononitrate 30 MG 24 hr tablet  Commonly known as:  IMDUR  Take 1 tablet (30 mg total) by mouth daily.     nebivolol 5 MG tablet  Commonly known as:  BYSTOLIC  Take 5 mg by mouth at bedtime.     PARoxetine 30 MG tablet  Commonly known as:  PAXIL  Take 30 mg by mouth daily.     polyvinyl alcohol 1.4 % ophthalmic solution  Commonly known as:  LIQUIFILM TEARS  Place 1 drop into both eyes 2 (two) times daily.  rosuvastatin 5 MG tablet  Commonly known as:  CRESTOR  Take 2.5 mg by mouth daily.     valsartan-hydrochlorothiazide 320-25 MG per tablet  Commonly known as:  DIOVAN-HCT  Take 1 tablet by mouth daily.       Family History Positive for CAD   Social History Social History  . Marital Status: Married   Social History Main Topics  . Smoking status: Never Smoker   . Smokeless tobacco: No  . Alcohol Use: No  . Drug Use: No   Review of Systems General: No chills, fever, night sweats or weight changes  Cardiovascular:  No chest pain, dyspnea on exertion, edema, orthopnea, palpitations, paroxysmal nocturnal  dyspnea Dermatological: No rash, lesions or masses Respiratory: No cough, dyspnea Urologic: No hematuria, dysuria Abdominal: No nausea, vomiting, diarrhea, bright red blood per rectum, melena, or hematemesis Neurologic: No visual changes, weakness, changes in mental status All other systems reviewed and are otherwise negative except as noted above.  Physical Exam Blood pressure 138/78, pulse 82, temperature 97.4 F (36.3 C), temperature source Oral, resp. rate 18, height 5\' 2"  (1.575 m), weight 118 lb (53.524 kg), SpO2 96.00%.  General: Well developed, well appearing 79 year old female in no acute distress. HEENT: Normocephalic, atraumatic. EOMs intact. Sclera nonicteric. Oropharynx clear.  Neck: Supple without bruits. No JVD. Lungs: Respirations regular and unlabored, CTA bilaterally. No wheezes, rales or rhonchi. Heart: RRR. S1, S2 present. No murmurs, rub, S3 or S4. Abdomen: Soft, non-tender, non-distended. BS present x 4 quadrants. No hepatosplenomegaly.  Extremities: No clubbing, cyanosis or edema. DP/PT/Radials 2+ and equal bilaterally. Psych: Normal affect. Neuro: Alert and oriented X 3. Moves all extremities spontaneously. Musculoskeletal: No kyphosis. Skin: Intact. Warm and dry. No rashes or petechiae in exposed areas.    Labs Pending  Radiology/Studies No results found.   Assessment and Plan 1. Ischemic CM, EF 35-40%, with chronic systolic HF, NYHA class II-III 2. CHB - PPM dependent 3. PPM battery at ERI 4. CAD s/p CABG 5. OSA  [name] has an ischemic CM with NYHA class II-III HF despite maximal medical therapy. She is PPM dependent, V paced with a broad QRS. She meets criteria for BiV PPM implant for CRT and is expected to receive benefit from cardiac resynchronization therapy. Risks, benefits and alternatives to Bi-V PPM were discussed in detail with [name]. These risks include, but are not limited to, bleeding, infection, pneumothorax, perforation, tamponade,  vascular damage, renal failure, lead dislodgement, MI, stroke and death. She expressed verbal understanding and agrees to proceed.  Dr. Graciela Husbands to see Signed, Rick Duff, PA-C 11/21/2012, 8:02 AM

## 2012-11-21 NOTE — H&P (Signed)
Seen and agree with above For CRT upgrade will need venogram and then antimicrobial pouch

## 2012-11-21 NOTE — CV Procedure (Signed)
CORRINA STEFFENSEN [id number]  [id number]  Preop Dx: compleete heart block, previous device at [facility] CHF class 3 Nonischemic cardiomyopathy Postop Dx same/  Procedure:contrast venogram Explantation of previous device Insertion of LV lead Insertion of new device Pocket revision  Cx: None   Dictation number  [id number]  [name], MD 11/21/2012 12:59 PM

## 2012-11-21 NOTE — Progress Notes (Signed)
Patient refused cpap tonight. RT will continue to monitor. 

## 2012-11-22 ENCOUNTER — Encounter: Payer: Self-pay | Admitting: Cardiovascular Disease

## 2012-11-22 DIAGNOSIS — I442 Atrioventricular block, complete: Secondary | ICD-10-CM | POA: Diagnosis not present

## 2012-11-22 NOTE — Op Note (Signed)
[name], [name] NO.:  [id number]  MEDICAL RECORD NO.:  [id number]  LOCATION:  [id number]                        FACILITY:  [facility]  PHYSICIAN:  Duke Salvia, MD, FACCDATE OF BIRTH:  11-05-1930  DATE OF PROCEDURE:  11/21/2012 DATE OF DISCHARGE:                              OPERATIVE REPORT   PREOPERATIVE DIAGNOSES:  Previously implanted Medtronic pacemaker, now at elective replacement indicator; congestive heart failure; device dependent; nonischemic cardiomyopathy.  POSTOPERATIVE DIAGNOSES:  Previously implanted Medtronic pacemaker, now at elective replacement indicator; congestive heart failure; device dependent; nonischemic cardiomyopathy.  PROCEDURES:  Contrast venogram, explantation of a previously implanted pacemaker, insertion of an LV lead, and insertion of a CRT pacemaker generator.  DESCRIPTION OF PROCEDURE:  Following obtaining informed consent, the patient was placed on the fluoroscopic table in the supine position. After routine prep and drape, contrast venography having demonstrated patency of the extrathoracic left subclavian vein.  An incision was made just cephalad to the prior incision and carried down to the layer of the prepectoral fascia using electrocautery and sharp dissection.  The pocket was not opened.  Attention was turned to gain access to the extrathoracic left subclavian vein, which was accomplished without difficulty without the aspiration of air or puncture of the artery.  A 9.5-French sheath was placed, which was then passed a Medtronic MB2 coronary sinus cannulation catheter.  Subselection of a posterolateral branch was relatively easily accomplished.  We then needed a couple of Wholey wires and retention anchoring uses of the fourth Wholey wire to allow for cannulation of the body of the coronary sinus.  This having been accomplished.  A contrast venogram demonstrated a mid lateral and a high lateral branch.  Both of these  were targeted.  We spent more than an hour targeting and re-cannulating with both a regular Whisper wire and then a Mailman wire to this midlateral branch.  On both occasions, however, we were able to pass neither the Attain II subselection dilator nor the St. Jude 1458Q LV lead, serial number was [id number].  At that point, having exhausted my options, I abandoned this location.  We had also tried the high anterolateral branch, but it had even more of a shepherd's crook takeoff.  Then, did the midlateral branch until we went to the inferolateral branch and placed the lead in the distal ramification.  We then paced off of the proximal 2 pulls where the LV threshold was 2.5 at 0.5.  The impedance of 1100 ohms, current threshold of 2.1 mA.  There was no diaphragmatic pacing at 10 volts.  The deployment system was then removed.  The lead was secured.  At this point, the device pocket was opened.  The device was explanted. The previously implanted RV lead, which was a Medtronic lead, serial number [id number] was assessed.  Visually, it appeared to be normal. Threshold was 0.5 at 0.5.  This lead was then attached to the [facility]. Jude Allure Quadra RF pulse generator, model [id number], serial number [id number]. This allowed [place] to maintaining integrity of her pacing while the LV lead, which had been used as a backup ready pacer, it was then attached to the pacemaker.  The atrial  lead was assessed, it was Medtronic 5076 lead, the amplitude was 1.5 millivolts with a pacing threshold 0.9 at 0.5.  Current of threshold was 536 ohms.  The pocket was expanded caudally to allow for housing of the pulse generator to 25% bigger.  We also because of tethering had been concerned about the possibility of infection and an Aegis antimicrobial pouch was placed in the pocket. The leads and the pulse generator were placed in the pocket and secured to the prepectoral fascia.  The leads and the pulse generator were secured to  the prepectoral fascia with this pouch.  The pocket was copiously irrigated with antibiotic containing saline solution.  Hemostasis was assured and the wound was then closed in 3 layers in normal fashion.  The wound was washed, dried, and a benzoin, Steri-Strip dressing was applied.  Needle counts, sponge counts, and instrument counts were correct at the end of the procedure according to the staff.  The patient tolerated the procedure without apparent complication.     Duke Salvia, MD, [facility]     SCK/MEDQ  D:  11/21/2012  T:  11/22/2012  Job:  [id number]

## 2012-11-22 NOTE — Discharge Summary (Signed)
ELECTROPHYSIOLOGY PROCEDURE DISCHARGE SUMMARY    Patient ID: [name],  MRN: [id number], DOB/AGE: [birth year] 79 y.o.  Admit date: 11/21/2012 Discharge date: 11/22/2012  Primary Cardiologist: Taylor Jaeger, MD Electrophysiologist: Taylor Manges, MD  Primary Discharge Diagnosis:  Ischemic cardiomyopathy and complete heart block status post cardiac resynchronization therapy pacemaker upgrade this admission  Secondary Discharge Diagnosis:  1. Chronic systolic heart failure  2. Sleep apnea 3.  CAD- s/p CABG in 2003 4.  Valvular heart disease s/p MVR at time of CABG  Procedures This Admission: 1.  Upgrade of previously implanted dual chamber pacemaker to a cardiac resynchronization therapy pacemaker on 11-22-2011 by Dr [name].  The patient received a STJ Allure Quadra pacemaker.  The previously implanted RA and RV leads were used.  A new 1458Q left ventricular lead was placed. There were no early apparent complications. 2.  CXR post procedure demonstrated no pneumothorax.   Brief HPI: [name] is an 79 year old woman who presents today for BiV PPM upgrade. She has a history of coronary artery disease with bypass graft in 2003. She had undergone prior stenting. She also had mitral valve repair at that time. Because of recurring chest pain in 2012 she underwent catheterization demonstrated native 80-85% left main and 70% ostial LAD at the point of an old stent, ramus occlusion and a large dominant RCA. Patent LIMA to LAD and patent SVG to ramus. Ejection fraction was 35-40%. Repeat catheterization December 2013 demonstrated mild progression of disease with an ejection fraction of 35-40%. She underwent pacemaker implant for sinus node dysfunction in 2007. She has subsequently developed CHB and is PPM dependent. Her Medtronic Enrhythm battery has reached ERI. She is here for CRT upgrade as recommended by Dr. Graciela Erickson.   Hospital Course:  The patient was admitted and underwent implantation of a STJ CRTP  with details as outlined above.   She was monitored on telemetry overnight which demonstrated sinus rhythm with ventricular pacing.  Left chest was without hematoma or ecchymosis.  The device was interrogated and found to be functioning normally.  CXR was obtained and demonstrated no pneumothorax status post device implantation.  Wound care, arm mobility, and restrictions were reviewed with the patient.  Dr [name] examined the patient and considered them stable for discharge to home.    Discharge Vitals: Blood pressure 132/59, pulse 70, temperature 98.6 F (37 C), temperature source Oral, resp. rate 18, height 5\' 2"  (1.575 m), weight 119 lb 7.8 oz (54.2 kg), SpO2 98.00%.    Labs:   Lab Results  Component Value Date   WBC 4.7 10/26/2012   HGB 12.1 10/26/2012   HCT 34.9* 10/26/2012   MCV 88.4 10/26/2012   PLT 133.0* 10/26/2012     Discharge Medications:    Medication List    TAKE these medications       albuterol 108 (90 BASE) MCG/ACT inhaler  Commonly known as:  PROVENTIL HFA;VENTOLIN HFA  Inhale 2 puffs into the lungs every 6 (six) hours as needed for wheezing.     aspirin EC 81 MG tablet  Take 81 mg by mouth daily.     CALCIUM 600/VITAMIN D 600-400 MG-UNIT per tablet  Generic drug:  Calcium Carbonate-Vitamin D  Take 1 tablet by mouth daily.     doxycycline 100 MG tablet  Commonly known as:  VIBRA-TABS  Take 100 mg by mouth 2 (two) times daily. On course for bronchitis. To finish on 11/21/12     ezetimibe 10 MG tablet  Commonly known as:  ZETIA  Take 1 tablet (10 mg total) by mouth daily.     isosorbide mononitrate 30 MG 24 hr tablet  Commonly known as:  IMDUR  Take 1 tablet (30 mg total) by mouth daily.     nebivolol 5 MG tablet  Commonly known as:  BYSTOLIC  Take 5 mg by mouth at bedtime.     PARoxetine 30 MG tablet  Commonly known as:  PAXIL  Take 30 mg by mouth daily.     polyvinyl alcohol 1.4 % ophthalmic solution  Commonly known as:  LIQUIFILM TEARS  Place 1 drop into  both eyes 2 (two) times daily.     rosuvastatin 5 MG tablet  Commonly known as:  CRESTOR  Take 2.5 mg by mouth daily.     valsartan-hydrochlorothiazide 320-25 MG per tablet  Commonly known as:  DIOVAN-HCT  Take 1 tablet by mouth daily.        Disposition:      Discharge Orders   Future Appointments Provider Department Dept Phone   12/01/2012 11:00 AM [facility] [employer] Main Office [place]) [phone]   02/28/2013 9:15 AM Taylor Salvia, MD [place] Main Office [place]) [phone]   Future Orders Complete By Expires     Diet - low sodium heart healthy  As directed     Discharge instructions  As directed     Comments:      Please see post BiV PPM implant instructions    Increase activity slowly  As directed       Follow-up Information   Follow up with [facility] On 12/01/2012. (At 11:00 AM for wound check)    Contact information:   [address] [place] [address] [phone]      Follow up with Taylor Manges, MD On 02/28/2013. (At 9:15 AM)    Contact information:   [address] [place] [address] [phone]      Follow up with Taylor Bihari, MD. Schedule an appointment as soon as possible for a visit in 6 weeks. (For hospital follow-up)    Contact information:   [address] [place] [address] [phone]      Duration of Discharge Encounter: Greater than 30 minutes including physician time.  Signed, Taylor Balsam, RN, BSN 11/22/2012, 8:23 AM

## 2012-11-22 NOTE — Progress Notes (Signed)
Pt discharged to home per MD order. Pt received and reviewed all discharge instructions and medication information including follow-up appointments and prescriptions and post-pacemaker activity restrictions and wound site care. Pt verbalized understanding. Pt alert and oriented at discharge with no complaints of pain. Pt escorted to private vehicle via wheelchair by guest services volunteer. Efraim Kaufmann

## 2012-11-22 NOTE — Progress Notes (Signed)
   ELECTROPHYSIOLOGY ROUNDING NOTE    Patient Name: [name] Date of Encounter: 11-22-2012    SUBJECTIVE:Patient feels well.  No chest pain or shortness of breath.  Moderate incisional soreness.  S/p CRT-P upgrade 11-21-2012  TELEMETRY: Reviewed telemetry pt in sinus rhythm with ventricular pacing Filed Vitals:   11/21/12 2000 11/21/12 2100 11/21/12 2200 11/22/12 0534  BP: 112/48 116/53 111/45 132/59  Pulse:    70  Temp:    98.6 F (37 C)  TempSrc:    Oral  Resp:    18  Height:      Weight:    119 lb 7.8 oz (54.2 kg)  SpO2:    98%    Intake/Output Summary (Last 24 hours) at 11/22/12 8119 Last data filed at 11/21/12 1700  Gross per 24 hour  Intake    440 ml  Output    400 ml  Net     40 ml    Radiology/Studies:  Dg Chest Portable 1 View 11/21/2012   *RADIOLOGY REPORT*  Clinical Data: Rule out pneumothoraxpost pacer placement  PORTABLE CHEST - 1 VIEW  Comparison: Plain film 05/16/2012  Findings: There is a left-sided pacemaker with multiple continuous leads.  No evidence of pneumothorax.  No pulmonary edema.  Coronary stent noted.  IMPRESSION: No complication following left-side pacer placement.   Original Report Authenticated By: Genevive Bi, M.D.    PHYSICAL EXAM Left chest without hematoma or ecchymosis. Well developed and nourished in no acute distress HENT normal Neck supple  Clear Regular rate and rhythm, no murmurs or gallops Abd-soft with active BS No Clubbing cyanosis edema Skin-warm and dry A & Oriented  Grossly normal sensory and motor function   DEVICE INTERROGATION: Device interrogation pending  Wound care, arm mobility, restrictions reviewed with patient.  Routine follow up scheduled.  Daphene Jaeger primary cards-- she does not yet have follow up scheduled with him  F/u SK 6 weekls

## 2012-11-24 ENCOUNTER — Telehealth: Payer: Self-pay | Admitting: Nurse Practitioner

## 2012-11-24 NOTE — Telephone Encounter (Signed)
Patient contacted regarding discharge from [facility] on 6/3.  Patient understands to follow up with provider Wound Check on 6/12 at 11:00 at [facility]. Patient understands discharge instructions? Yes Patient understands medications and regiment? Yes Patient understands to bring all medications to this visit? Yes  Patient states she is feeling well.  She states that she has some bleeding at the pacemaker insertion site and that a double layer bandage was applied.  She is to f/u with [place] tomorrow.

## 2012-11-30 ENCOUNTER — Ambulatory Visit (INDEPENDENT_AMBULATORY_CARE_PROVIDER_SITE_OTHER): Payer: Medicare Other | Admitting: *Deleted

## 2012-11-30 DIAGNOSIS — I5022 Chronic systolic (congestive) heart failure: Secondary | ICD-10-CM

## 2012-11-30 DIAGNOSIS — I442 Atrioventricular block, complete: Secondary | ICD-10-CM

## 2012-11-30 LAB — PACEMAKER DEVICE OBSERVATION
AL IMPEDENCE PM: 462.5 Ohm
AL THRESHOLD: 0.5 V
BAMS-0003: 70 {beats}/min
DEVICE MODEL PM: 2960456
LV LEAD THRESHOLD: 2.625 V
RV LEAD THRESHOLD: 0.75 V

## 2012-11-30 NOTE — Progress Notes (Signed)
Wound check-PPM in office. 

## 2012-12-01 ENCOUNTER — Ambulatory Visit: Payer: Medicare Other

## 2012-12-07 DIAGNOSIS — J00 Acute nasopharyngitis [common cold]: Secondary | ICD-10-CM | POA: Diagnosis not present

## 2012-12-15 ENCOUNTER — Other Ambulatory Visit: Payer: Self-pay | Admitting: *Deleted

## 2012-12-15 MED ORDER — NEBIVOLOL HCL 5 MG PO TABS: 5.0000 mg | ORAL_TABLET | Freq: Every day | ORAL | Status: DC

## 2012-12-19 DIAGNOSIS — R51 Headache: Secondary | ICD-10-CM | POA: Diagnosis not present

## 2012-12-22 ENCOUNTER — Encounter: Payer: Self-pay | Admitting: Cardiovascular Disease

## 2013-01-13 ENCOUNTER — Ambulatory Visit: Payer: Medicare Other | Admitting: Cardiovascular Disease

## 2013-01-16 DIAGNOSIS — R51 Headache: Secondary | ICD-10-CM

## 2013-01-17 DIAGNOSIS — M5030 Other cervical disc degeneration, unspecified cervical region: Secondary | ICD-10-CM | POA: Diagnosis not present

## 2013-01-17 DIAGNOSIS — R51 Headache: Secondary | ICD-10-CM | POA: Diagnosis not present

## 2013-01-19 ENCOUNTER — Ambulatory Visit: Payer: Medicare Other | Admitting: Cardiovascular Disease

## 2013-01-25 ENCOUNTER — Encounter: Payer: Self-pay | Admitting: Neurology

## 2013-01-25 ENCOUNTER — Ambulatory Visit: Payer: Medicare Other | Admitting: Cardiovascular Disease

## 2013-01-25 ENCOUNTER — Ambulatory Visit (INDEPENDENT_AMBULATORY_CARE_PROVIDER_SITE_OTHER): Payer: Medicare Other | Admitting: Neurology

## 2013-01-25 VITALS — BP 166/72 | HR 65 | Ht 65.0 in | Wt 124.0 lb

## 2013-01-25 DIAGNOSIS — M542 Cervicalgia: Secondary | ICD-10-CM | POA: Diagnosis not present

## 2013-01-25 DIAGNOSIS — R51 Headache: Secondary | ICD-10-CM

## 2013-01-25 MED ORDER — TIZANIDINE HCL 2 MG PO CAPS: 2.0000 mg | ORAL_CAPSULE | ORAL | Status: DC

## 2013-01-25 NOTE — Progress Notes (Signed)
[facility] [address] [place]. [place] [address] [phone]       Consult  NOTE  [name] Date of Birth:  [birth year] Medical Record Number:  [id number]  Referring MD : Keturah Barre Reason for referral : Occipital Headache HPI: 39 year Caucasian lady occipital headaches for the last 6 weeks which have been occurring on a daily basis. She does have a long-standing history of headaches which were diagnosed as migraines in the past but in recent years she had posterior headaches will trigger points and [facility] and treated with trigger point injections multiple occasions with complete transient relief. She was tried on Topamax as well and did not tolerate it. She complaints of posterior occipital headaches which are described as being sharp and severe and disabling. The headache does radiate up the back of the head and occasionally down her shoulders as well. She does admit to muscle tightness and spasm  some time. She obtained transient relief with neck massage and chiropractic adjustment. She also feels relief with local ice application. She underwent x-rays of the cervical spine and she is brought for my review which show degenerative changes at C4. She has also tried muscle relaxants with little and methocarbamol which she cannot tolerate due to sleepiness. She was recently prescribed Fioricet but had also made her drowsy. 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 She denies any loss of vision with her headaches or scalp tenderness, mildly [place] or jaw claudication.  ROS:   14 system review of systems is positive for fatigue, hearing loss, cough, incontinence, easy bruising, feeling hot, headache, neck pain, dizziness depression and change in appetite  PMH:  Past Medical History  Diagnosis Date  . Chronic systolic heart  failure 10/11/2012  . Ischemic cardiomyopathy- s/p CABG 10/11/2012    EF 39%  . Complete heart block 10/11/2012  . Pacemaker-Medtronic 10/11/2012  . Stress incontinence     multiple surgeries  . Coronary artery disease   . Anginal pain   . Hypertension   . Shortness of breath   . OSA (obstructive sleep apnea) 10/11/2012    uses cpap  . GERD (gastroesophageal reflux disease)   . H/O hiatal hernia   . Headache(784.0)   . Anemia     Social History:  History   Social History  . Marital Status: Married    Spouse Name: N/A    Number of Children: N/A  . Years of Education: N/A   Occupational History  . Not on file.   Social History Main Topics  . Smoking status: Never Smoker   . Smokeless tobacco: Never Used  . Alcohol Use: No  . Drug Use: No  . Sexually Active: Not on file   Other Topics Concern  . Not on file   Social History Narrative  . No narrative on file    Medications:   Current Outpatient Prescriptions on File Prior to Visit  Medication Sig Dispense Refill  . albuterol (PROVENTIL HFA;VENTOLIN HFA) 108 (90 BASE) MCG/ACT inhaler Inhale 2 puffs into the lungs every 6 (six) hours as needed for wheezing.      [name] aspirin EC 81 MG tablet Take 81 mg by mouth daily.      . Calcium Carbonate-Vitamin D (CALCIUM 600/VITAMIN D) 600-400 MG-UNIT per tablet Take 1 tablet by mouth daily.      [name]  doxycycline (VIBRA-TABS) 100 MG tablet Take 100 mg by mouth 2 (two) times daily. On course for bronchitis. To finish on 11/21/12      . ezetimibe (ZETIA) 10 MG tablet Take 1 tablet (10 mg total) by mouth daily.  30 tablet  10  . isosorbide mononitrate (IMDUR) 30 MG 24 hr tablet Take 1 tablet (30 mg total) by mouth daily.  30 tablet  11  . nebivolol (BYSTOLIC) 5 MG tablet Take 1 tablet (5 mg total) by mouth at bedtime.  30 tablet  11  . PARoxetine (PAXIL) 30 MG tablet Take 30 mg by mouth daily.      . polyvinyl alcohol (LIQUIFILM TEARS) 1.4 % ophthalmic solution Place 1 drop into both eyes 2  (two) times daily.      . rosuvastatin (CRESTOR) 5 MG tablet Take 2.5 mg by mouth daily.      . valsartan-hydrochlorothiazide (DIOVAN-HCT) 320-25 MG per tablet Take 1 tablet by mouth daily.       No current facility-administered medications on file prior to visit.    Allergies:   Allergies  Allergen Reactions  . Dilaudid (Hydromorphone Hcl) Other (See Comments)    "jerking"  . Morphine And Related Other (See Comments)    "jerking"  . Sulfa Antibiotics Other (See Comments)    unknown    Physical Exam General: frail petite elderly Caucasian lady, seated, in no evident distress Head: head normocephalic and atraumatic. Orohparynx benign Neck: supple with no carotid or supraclavicular bruits Cardiovascular: regular rate and rhythm, no murmurs Musculoskeletal: spasm posterior cervical and scapular muscles with trigger point right occipital groove and shoulder. Mild kyphosis. Skin:  no rash/petichiae Vascular:  Normal pulses all extremities Filed Vitals:   01/25/13 1407  BP: 166/72  Pulse: 65    Neurologic Exam Mental Status: Awake and fully alert. Oriented to place and time. Recent and remote memory intact. Attention span, concentration and fund of knowledge appropriate. Mood and affect appropriate.  Cranial Nerves: Fundoscopic exam reveals sharp disc margins. Pupils equal, briskly reactive to light. Extraocular movements full without nystagmus. Visual fields full to confrontation. Hearing intact. Facial sensation intact. Face, tongue, palate moves normally and symmetrically.  Motor: Normal bulk and tone. Normal strength in all tested extremity muscles. Sensory.: intact to tough and pinprick and vibratory.  Coordination: Rapid alternating movements normal in all extremities. Finger-to-nose and heel-to-shin performed accurately bilaterally. Gait and Station: Arises from chair without difficulty. Stance is normal. Gait demonstrates normal stride length and balance . Able to heel, toe  and tandem walk without difficulty.  Reflexes: 1+ and symmetric. Toes downgoing.     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    PLAN: Trial of Zanaflex 2 mg at night for one week and if tolerated without side effects 2 twice a day and further as needed. Check CT scan of the cervical spine and brain with and without contrast. I have advised patient to do neck stretching exercises as well as local heat application. Return for followup in 6 weeks or earlier if necessary.

## 2013-01-25 NOTE — Patient Instructions (Addendum)
Trial of Zanaflex 2 mg at night for one week and if tolerated without side effects 2 twice a day and further as needed. Check CT scan of the cervical spine and brain with and without contrast. I have advised patient to do neck stretching exercises as well as local heat application. Return for followup in 6 weeks or earlier if necessary.

## 2013-01-27 ENCOUNTER — Ambulatory Visit
Admission: RE | Admit: 2013-01-27 | Discharge: 2013-01-27 | Disposition: A | Discharge status: Home / Self Care | Payer: Medicare Other | Source: Ambulatory Visit | Attending: Neurology | Admitting: Neurology

## 2013-01-27 ENCOUNTER — Telehealth: Payer: Self-pay | Admitting: *Deleted

## 2013-01-27 DIAGNOSIS — R51 Headache: Secondary | ICD-10-CM

## 2013-01-27 DIAGNOSIS — M4802 Spinal stenosis, cervical region: Secondary | ICD-10-CM | POA: Diagnosis not present

## 2013-01-27 DIAGNOSIS — M542 Cervicalgia: Secondary | ICD-10-CM

## 2013-01-27 MED ORDER — IOHEXOL 300 MG/ML  SOLN
75.0000 mL | Freq: Once | INTRAMUSCULAR | Status: AC | PRN
  Administered 2013-01-27: 75 mL via INTRAVENOUS

## 2013-01-27 NOTE — Telephone Encounter (Signed)
Per Dr. Pearlean Brownie ok to change CT Cervical Spine to w/o contrast.  DONE.

## 2013-01-27 NOTE — Addendum Note (Signed)
Addended byHermenia Fiscal on: 01/27/2013 10:12 AM   Modules accepted: Orders

## 2013-01-30 ENCOUNTER — Encounter: Payer: Self-pay | Admitting: Cardiovascular Disease

## 2013-01-31 ENCOUNTER — Encounter: Payer: Self-pay | Admitting: Cardiovascular Disease

## 2013-01-31 ENCOUNTER — Ambulatory Visit (INDEPENDENT_AMBULATORY_CARE_PROVIDER_SITE_OTHER): Payer: Medicare Other | Admitting: Cardiovascular Disease

## 2013-01-31 VITALS — BP 140/60 | HR 64 | Ht 62.0 in | Wt 123.9 lb

## 2013-01-31 DIAGNOSIS — I2510 Atherosclerotic heart disease of native coronary artery without angina pectoris: Secondary | ICD-10-CM | POA: Diagnosis not present

## 2013-01-31 DIAGNOSIS — E782 Mixed hyperlipidemia: Secondary | ICD-10-CM

## 2013-01-31 MED ORDER — ROSUVASTATIN CALCIUM 10 MG PO TABS: 10.0000 mg | ORAL_TABLET | Freq: Every day | ORAL | Status: DC

## 2013-01-31 NOTE — Patient Instructions (Addendum)
Your physician recommends that you return for lab work fasting.  Your physician has requested that you have an echocardiogram. Echocardiography is a painless test that uses sound waves to create images of your heart. It provides your doctor with information about the size and shape of your heart and how well your heart's chambers and valves are working. This procedure takes approximately one hour. There are no restrictions for this procedure. This will be scheduled in October.  Your physician recommends that you schedule a follow-up appointment in: November.

## 2013-02-01 ENCOUNTER — Telehealth: Payer: Self-pay | Admitting: Neurology

## 2013-02-01 NOTE — Telephone Encounter (Signed)
Forwarded scans to Dr. Pearlean Brownie to read and advise .

## 2013-02-02 ENCOUNTER — Telehealth: Payer: Self-pay | Admitting: Neurology

## 2013-02-03 NOTE — Telephone Encounter (Signed)
I called pts husband and gave the results of MRI C spine and MRI brain.  She has taken zanaflex 2mg  po bid now and still having issues with her R neck/head pain.  Asking if anything else can be done (injection, other meds?).  Notable that her heart and meds she is on are a challenge.  Appt in 04/2013.  You wanted to see back in 6 wks.  Please advise.

## 2013-02-05 ENCOUNTER — Encounter: Payer: Self-pay | Admitting: Cardiovascular Disease

## 2013-02-05 NOTE — Progress Notes (Signed)
Patient ID: ATLAS KUC, female   DOB: 04-19-1931, 79 y.o.   MRN: [id number]     HPI: [name], is a 79 y.o. female who presents for cardiology followup evaluation. She has known coronary artery disease and underwent CABG surgery in 2003 with a  LIMA to the LAD, vein to the ramus intermediate, and also had mitral valve repair with an angioplasty ring.  I last saw her in January 2014. Since that time, she also saw Dr. Alanda Amass in April 2014.  In June 2014 her previously implanted Medtronic pacemaker had reached time for replacement and this was explanted by Dr. Graciela Husbands and she had insertion of an LV lead and insertion of a CRT pacemaker generator apparently, she had developed a worsening LV function and with her ejection fraction dropping to possibly 35% -40% biventricular therapy was instituted. Cardiac catheterization in December 2013 quantified ejection fraction 38.9%. She did have significant native CAD with previously noted 80-85% distal left main stenosis, new 70% ostial stenosis of her LAD proximal to the previously placed stent, total occlusion of the radius immediate vessel at the site of remote stenting. Her circumflex was diminutive without stenosis. Her RCA was a large dominant vessel with mild tender 20% luminal regularities. The LIMA graft to the LAD was patent as was a vein graft to the intermediate vessel.  Recently, [name] feels well. She does note improved energy. She denies recent episodes of chest pain. She denies PND orthopnea. She is unaware of tachycardia palpitations.    Past Medical History  Diagnosis Date  . Chronic systolic heart failure 10/11/2012  . Ischemic cardiomyopathy- s/p CABG 10/11/2012    EF 39%  . Complete heart block 10/11/2012  . Pacemaker-Medtronic 10/11/2012  . Stress incontinence     multiple surgeries  . Anginal pain   . Hypertension   . Shortness of breath   . OSA (obstructive sleep apnea) 10/11/2012    Uses CPAP. AHI was .45/hr at 9 cm water  pressure. RDI was 0.4/hr. Patient was unable to reach REM sleep and maintain the supine position at optimal pressure. Patient was able to sleep for prolonged periods of time at 9 cm water pressure without suffering further from respiratory events.  [name] GERD (gastroesophageal reflux disease)   . H/O hiatal hernia   . Headache(784.0)   . Anemia   . Hyperlipidemia   . Coronary artery disease   . Weakness 10/06/12    Past Surgical History  Procedure Laterality Date  . Pacemaker revision  11/21/2012    BIV   UPGRADE    WITH DR Graciela Husbands  . Appendectomy    . Abdominal hysterectomy    . Bladder tack    . Cholecystectomy    . Coronary artery bypass graft  2003    With LIMA-LAD and vein graft to OM, and associated mitral valve repair with anuloplasty ring and quadrangular resection of the posterior leaflet, 24 mm Seguin ring by Dr. Cornelius Moras.  . Coronary angioplasty with stent placement  2012    No significant CAD, patent but ectatic graft supplying the ramus, patent LIMA-LAD, and an EF of 50% at that time. She had an occluded stent to her circumflex from remote intervention. Required recatheterization 05/2012 by Dr. Tresa Endo and had a patent LIMA-LAD with distal diffuse disease, patent SVG-OM, 10%-20% mid right coronary but an EF of about 35%, quantitatively it was 38.9.  [name] Cardiac catheterization  05/2012    (1st cath 2012) This required recatheterization by Dr. Tresa Endo  and had a patent LIMA-LAD with distal diffuse disease, patent SVG-OM, 10% to 20% mid right coronary but an EF of about 35%, quantitatively iw was 38.9.  . Insert / replace / remove pacemaker  2007    Inserted 2007  . US echocardiography  04/13/12    EF - 25% to 35% with intraventricular dyssynchrony, RVSP of 46, moderate TR, and mild to moderate AI. She had a mild increased mitral valve gradient of 5 mmHg with minimal MR.   [name] Lexiscan myocardial stress test  08/20/11    The EF = 34%. This is a low risk scan. There is a new fix abnormality in the  distal LAD artery territory. Although no reversibility is seen, high grade obstruction with resting ischemia cannot be excluded.    Allergies  Allergen Reactions  . Dilaudid [Hydromorphone Hcl] Other (See Comments)    "jerking"  . Morphine And Related Other (See Comments)    "jerking"  . Sulfa Antibiotics Other (See Comments)    unknown    Current Outpatient Prescriptions  Medication Sig Dispense Refill  . aspirin EC 81 MG tablet Take 81 mg by mouth daily.      . Calcium Carbonate-Vitamin D (CALCIUM 600/VITAMIN D) 600-400 MG-UNIT per tablet Take 1 tablet by mouth daily.      . isosorbide mononitrate (IMDUR) 30 MG 24 hr tablet Take 1 tablet (30 mg total) by mouth daily.  30 tablet  11  . nebivolol (BYSTOLIC) 5 MG tablet Take 1 tablet (5 mg total) by mouth at bedtime.  30 tablet  11  . PARoxetine (PAXIL) 30 MG tablet Take 30 mg by mouth daily.      . polyvinyl alcohol (LIQUIFILM TEARS) 1.4 % ophthalmic solution Place 1 drop into both eyes 2 (two) times daily.      . tizanidine (ZANAFLEX) 2 MG capsule Take 1 capsule (2 mg total) by mouth as directed. Start 1 capsule at bedtime x 1 week if tolerated increase to twice daily  60 capsule  2  . valsartan-hydrochlorothiazide (DIOVAN-HCT) 320-25 MG per tablet Take 1 tablet by mouth daily.      [name] ezetimibe (ZETIA) 10 MG tablet Take 1 tablet (10 mg total) by mouth daily.  30 tablet  10  . rosuvastatin (CRESTOR) 10 MG tablet Take 1 tablet (10 mg total) by mouth daily.  30 tablet  6   No current facility-administered medications for this visit.    History   Social History  . Marital Status: Married    Spouse Name: N/A    Number of Children: N/A  . Years of Education: N/A   Occupational History  . Not on file.   Social History Main Topics  . Smoking status: Never Smoker   . Smokeless tobacco: Never Used  . Alcohol Use: No  . Drug Use: No  . Sexual Activity: Not on file   Other Topics Concern  . Not on file   Social History  Narrative  . No narrative on file    Family History  Problem Relation Age of Onset  . Cancer Father    Socially she is married has 3 children and 9 grandchildren.  ROS is negative for fevers, chills or night sweats. She denies chest pain. She denies PND or orthopnea. She denies wheezing. She still has fatigue but this has significantly improved. She continuously is still bothered at times by her urinary issues and has undergone multiple procedures in the past for this. She denies significant edema. She  denies bleeding. She has resumed using her CPAP therapy. therapy  Other system review is negative.  PE BP 140/60  Pulse 64  Ht 5\' 2"  (1.575 m)  Wt 123 lb 14.4 oz (56.201 kg)  BMI 22.66 kg/m2  General: Alert, oriented, no distress.  Skin: normal turgor, no rashes HEENT: Normocephalic, atraumatic. Pupils round and reactive; sclera anicteric;no lid lag.  Nose without nasal septal hypertrophy Mouth/Parynx benign; Mallinpatti scale 3 Neck: No JVD, no carotid briuts Lungs: clear to ausculatation and percussion; no wheezing or rales Heart: RRR, s1 s2 normal 1/6 systolic murmur. No S3 gallop. Abdomen: soft, nontender; no hepatosplenomehaly, BS+; abdominal aorta nontender and not dilated by palpation. Pulses 2+ Extremities: no clubbing cyanosis or edema, Homan's sign negative  Neurologic: grossly nonfocal  ECG: Paced rhythm, atrially sensed, ventricular paced with biventricular pacemaker activity.  LABS:  BMET    Component Value Date/Time   NA 131* 10/26/2012 1140   K 3.9 10/26/2012 1140   CL 91* 10/26/2012 1140   CO2 32 10/26/2012 1140   GLUCOSE 85 10/26/2012 1140   BUN 15 10/26/2012 1140   CREATININE 0.9 10/26/2012 1140   CALCIUM 9.4 10/26/2012 1140     Hepatic Function Panel     Component Value Date/Time   PROT 6.3 10/10/2010 1023   ALBUMIN 3.7 10/10/2010 1023   AST 68* 10/10/2010 1023   ALT 100* 10/10/2010 1023   ALKPHOS 218* 10/10/2010 1023   BILITOT 1.0 10/10/2010 1023   BILIDIR 0.2  10/10/2010 1023   IBILI 0.8 10/10/2010 1023     CBC    Component Value Date/Time   WBC 4.7 10/26/2012 1140   RBC 3.95 10/26/2012 1140   HGB 12.1 10/26/2012 1140   HCT 34.9* 10/26/2012 1140   PLT 133.0* 10/26/2012 1140   MCV 88.4 10/26/2012 1140   MCH 27.4 10/10/2010 1023   MCHC 34.7 10/26/2012 1140   RDW 14.5 10/26/2012 1140   LYMPHSABS 0.8 10/26/2012 1140   MONOABS 0.4 10/26/2012 1140   EOSABS 0.1 10/26/2012 1140   BASOSABS 0.0 10/26/2012 1140     BNP No results found for this basename: probnp    Lipid Panel  No results found for this basename: chol, trig, hdl, cholhdl, vldl, ldlcalc     RADIOLOGY: Ct Head W Wo Contrast  02/02/2013   [facility] [address] [place], [place] [address] [phone]  NEUROIMAGING REPORT   STUDY DATE:  01/27/13 PATIENT NAME: [name] DOB: [birth year] MRN: [id number]  ORDERING CLINICIAN: Dr Pearlean Brownie CLINICAL HISTORY: 24 year lady being evaluated for headaches COMPARISON FILMS: Ct head  12/17/2009 EXAM: CT Head w/wo TECHNIQUE: axial serial 5 mm sections were obtained from the skull base to the vertex prior to and after administration of IV contrast CONTRAST:  omnipaque 100  IMAGING SITE: [place] [facility]  FINDINGS:  The brain parenchyma shows mild changes of chronic microvascular ischemia and generalized cerebral atrophy. Age-appropriate calcific changes are noted in various brain structures which are physiological. No structural lesion, tumor or infarcts are noted. The subarachnoid spaces and medical system appear normal. Extraaxial brain structures appear normal. Orbits appear unremarkable. Paranasal sinuses show only minor chronic mucosal thickening. Contrast images do not demonstrate any abnormal areas of enhancement     IMPRESSION:  Mildly abnormal  CT scan of the brain showing mild changes of chronic microvascular ischemia and generalized cerebral atrophy. No enhancing lesions are noted. Overall significant changes compared with CT head dated  12/17/2009.   INTERPRETING PHYSICIAN:  Delia Heady, MD Certified in  Neuroimaging by American Society of Neuroimaging and [place] [employer]ities    Ct Cervical Spine Wo Contrast  01/27/2013   *RADIOLOGY REPORT*  Clinical Data: 79 year old female with posterior neck and bilateral shoulder pain.  Headaches.  CT CERVICAL SPINE WITHOUT CONTRAST  Technique:  Multidetector CT imaging of the cervical spine was performed. Multiplanar CT image reconstructions were also generated.  Comparison: Neck CTA 01/22/2010.  Findings: Left chest cardiac pacemaker and sequelae of median sternotomy.  Negative visualized posterior fossa noncontrast brain parenchyma. Calcified atherosclerosis at the skull base.  Distal left vertebral artery appears to be dominant.  Cervical carotid calcified atherosclerosis in the neck.  Other Visualized paraspinal soft tissues are within normal limits.  Negative lung apices.  Visualized skull base is intact.  No atlanto-occipital dissociation.  Preserved cervical lordosis. Cervicothoracic junction alignment is within normal limits.  Bilateral posterior element alignment is within normal limits.  C2-C3:  Negative.  C3-C4:  Moderate left and mild right facet hypertrophy.  Mild circumferential disc bulge.  No significant stenosis.  C4-C5:  Right facet joint ankylosis and hypertrophy.  Bilateral uncovertebral level interbody ankylosis.  No spinal stenosis.  Mild to moderate bilateral C5 foraminal stenosis.  C5-C6:  Mild anterolisthesis.  Moderate to severe facet hypertrophy greater on the right.  Circumferential disc bulge.  Ligament flavum hypertrophy.  No definite spinal stenosis.  Mild to moderate bilateral C6 foraminal stenosis.  C6-C7:  Mild circumferential disc osteophyte complex.  Mild facet hypertrophy.  Mild left C7 foraminal stenosis.  C7-T1:  Negative. Negative visualized upper thoracic levels.  IMPRESSION: 1.  No definite cervical spinal stenosis.  Chronic right  posterior element and some interbody ankylosis at C4-C5. 2.  Facet and uncovertebral hypertrophy at that level and elsewhere.  Subsequent up to moderate neural foraminal stenosis at the bilateral C5 and C6 nerve levels.   Original Report Authenticated By: Erskine Speed, M.D.      ASSESSMENT AND PLAN: From a cardiac standpoint, [name] is doing well. She is status post CABG surgery and that her last Pap in December 2013 her graft remained patent. Dr. Graciela Husbands did recently upgrade her pacemaker which was end of life and now she has biventricular pacemaker. Her energy level has somewhat improved. She is using her CPAP therapy for sleep apnea. Previously I had discussed with her when she was not using this the importance of this therapy particularly when not used the potential increased risk for nocturnal dysrhythmias another cardiovascular implications. In October, I am recommending that she undergo a 2-D echo Doppler study to reassess her LV function in particular since she will be status post her biventricular pacer for several months. I'll see her back in the office in November and further recommendations will be made at that time.     Lennette Bihari, MD, [facility]  02/05/2013 2:03 PM

## 2013-02-07 ENCOUNTER — Telehealth (HOSPITAL_COMMUNITY): Payer: Self-pay | Admitting: Cardiovascular Disease

## 2013-02-08 ENCOUNTER — Telehealth: Payer: Self-pay | Admitting: Cardiovascular Disease

## 2013-02-08 DIAGNOSIS — E782 Mixed hyperlipidemia: Secondary | ICD-10-CM | POA: Diagnosis not present

## 2013-02-08 DIAGNOSIS — I2510 Atherosclerotic heart disease of native coronary artery without angina pectoris: Secondary | ICD-10-CM | POA: Diagnosis not present

## 2013-02-08 NOTE — Telephone Encounter (Signed)
Victorino Dike at [facility] called.  Stated pt arrived to have labs drawn w/ E-Req for [employer].  Needed clarification on labs and requested signature.  Labs clarified.  Informed Dr. Tresa Endo is on of the office this week and unable to sign.  Victorino Dike asked if there is documentation of the co-sign and informed RN will print and fax.  [phone].  Done.  Advised if that is not sufficient to have pt go to local [facility] lab for specimen collection.  Verbalized understanding and agreed w/ plan.

## 2013-02-08 NOTE — Telephone Encounter (Signed)
I spoke to the patient's husband and gave results of CT cervical spine and brain. She is still having bad headaches hence advised her to increase her Zanaflex to 2 mg in the morning and 4 at night for 3 days and then further to 4 mg twice daily if tolerated. Keep her scheduled appointment with me

## 2013-02-08 NOTE — Telephone Encounter (Signed)
Needs a signature for blood work being done and she does need a lipit order .[name] Please Call   Thanks

## 2013-02-08 NOTE — Telephone Encounter (Signed)
See previous note

## 2013-02-14 ENCOUNTER — Ambulatory Visit: Payer: Medicare Other | Admitting: Neurology

## 2013-02-14 DIAGNOSIS — M542 Cervicalgia: Secondary | ICD-10-CM | POA: Diagnosis not present

## 2013-02-14 DIAGNOSIS — IMO0001 Reserved for inherently not codable concepts without codable children: Secondary | ICD-10-CM | POA: Diagnosis not present

## 2013-02-14 DIAGNOSIS — G518 Other disorders of facial nerve: Secondary | ICD-10-CM | POA: Diagnosis not present

## 2013-02-21 ENCOUNTER — Ambulatory Visit (HOSPITAL_COMMUNITY)
Admission: RE | Admit: 2013-02-21 | Discharge: 2013-02-21 | Disposition: A | Discharge status: Home / Self Care | Payer: Medicare Other | Source: Ambulatory Visit | Attending: Cardiovascular Disease | Admitting: Cardiovascular Disease

## 2013-02-21 DIAGNOSIS — R0989 Other specified symptoms and signs involving the circulatory and respiratory systems: Secondary | ICD-10-CM | POA: Insufficient documentation

## 2013-02-21 DIAGNOSIS — G4733 Obstructive sleep apnea (adult) (pediatric): Secondary | ICD-10-CM | POA: Diagnosis not present

## 2013-02-21 DIAGNOSIS — Z9861 Coronary angioplasty status: Secondary | ICD-10-CM | POA: Insufficient documentation

## 2013-02-21 DIAGNOSIS — I255 Ischemic cardiomyopathy: Secondary | ICD-10-CM

## 2013-02-21 DIAGNOSIS — I2589 Other forms of chronic ischemic heart disease: Secondary | ICD-10-CM | POA: Diagnosis not present

## 2013-02-21 DIAGNOSIS — I10 Essential (primary) hypertension: Secondary | ICD-10-CM | POA: Diagnosis not present

## 2013-02-21 DIAGNOSIS — E785 Hyperlipidemia, unspecified: Secondary | ICD-10-CM | POA: Insufficient documentation

## 2013-02-21 DIAGNOSIS — Z95810 Presence of automatic (implantable) cardiac defibrillator: Secondary | ICD-10-CM | POA: Insufficient documentation

## 2013-02-21 DIAGNOSIS — I2510 Atherosclerotic heart disease of native coronary artery without angina pectoris: Secondary | ICD-10-CM | POA: Insufficient documentation

## 2013-02-21 DIAGNOSIS — I509 Heart failure, unspecified: Secondary | ICD-10-CM | POA: Diagnosis not present

## 2013-02-21 DIAGNOSIS — R0609 Other forms of dyspnea: Secondary | ICD-10-CM | POA: Insufficient documentation

## 2013-02-21 NOTE — Progress Notes (Signed)
[facility]   2D echo completed 02/21/2013.   Cindy Kanisha Duba, RDCS  

## 2013-02-28 ENCOUNTER — Ambulatory Visit (INDEPENDENT_AMBULATORY_CARE_PROVIDER_SITE_OTHER): Payer: Medicare Other | Admitting: Internal Medicine

## 2013-02-28 ENCOUNTER — Encounter: Payer: Self-pay | Admitting: Internal Medicine

## 2013-02-28 VITALS — BP 113/61 | HR 67 | Ht 62.0 in | Wt 122.0 lb

## 2013-02-28 DIAGNOSIS — M542 Cervicalgia: Secondary | ICD-10-CM | POA: Diagnosis not present

## 2013-02-28 DIAGNOSIS — I255 Ischemic cardiomyopathy: Secondary | ICD-10-CM

## 2013-02-28 DIAGNOSIS — IMO0001 Reserved for inherently not codable concepts without codable children: Secondary | ICD-10-CM | POA: Diagnosis not present

## 2013-02-28 DIAGNOSIS — Z950 Presence of cardiac pacemaker: Secondary | ICD-10-CM | POA: Diagnosis not present

## 2013-02-28 DIAGNOSIS — I2589 Other forms of chronic ischemic heart disease: Secondary | ICD-10-CM

## 2013-02-28 DIAGNOSIS — G518 Other disorders of facial nerve: Secondary | ICD-10-CM | POA: Diagnosis not present

## 2013-02-28 DIAGNOSIS — I442 Atrioventricular block, complete: Secondary | ICD-10-CM | POA: Diagnosis not present

## 2013-02-28 DIAGNOSIS — I5032 Chronic diastolic (congestive) heart failure: Secondary | ICD-10-CM

## 2013-02-28 DIAGNOSIS — R51 Headache: Secondary | ICD-10-CM | POA: Diagnosis not present

## 2013-02-28 LAB — PACEMAKER DEVICE OBSERVATION
ATRIAL PACING PM: 60
BAMS-0003: 70 {beats}/min
LV LEAD THRESHOLD: 2.75 V
RV LEAD IMPEDENCE PM: 562.5 Ohm
VENTRICULAR PACING PM: 99

## 2013-02-28 NOTE — Assessment & Plan Note (Signed)
The patient's device was interrogated and the information was fully reviewed.  The device was reprogrammed to  Maximize battery longevity 

## 2013-02-28 NOTE — Assessment & Plan Note (Signed)
Stable and euvolemic 

## 2013-02-28 NOTE — Progress Notes (Signed)
Patient Care Team: Hadley Pen as PCP - General (Family Medicine)   HPI  [name] is a 79 y.o. female Seen following CRT-P upgrade 6/14  She has hx of coronary artery disease with bypass graft in 2003. She had undergone prior stenting. She also  Had mitral valve repair at that time. Because of recurring chest pain in 2012 she underwent catheterization demonstrated a patent LIMA and patent vein graft to the ramus. A large and patent right CA  Repeat catheterization 12/13 demonstrated mild progression of disease with an ejection fraction of 35-40%  She has a history of a pacemaker implanted o 2007. He is a Medtronic and rhythm which has reached ERI  Implant   indication apparently was sick sinus syndrome.  She has a history of coronary artery disease with a catheterization 2013-December demonstrating 80-85% left main and 70% ostial LAD at the point of an old stent in the ramus occlusion and a large dominant right. Ejection fraction was 35-40%  Since undergoing upgrade, she's been no appreciable change in her dyspnea. She has had significant problems with migraines however.  Echocardiogram last week 9/14  demonstrated normalization of ventricular systolic function.  Past Medical History  Diagnosis Date  . Chronic systolic heart failure 10/11/2012  . Ischemic cardiomyopathy- s/p CABG 10/11/2012    EF 39%  . Complete heart block 10/11/2012  . Pacemaker-Medtronic 10/11/2012  . Stress incontinence     multiple surgeries  . Anginal pain   . Hypertension   . Shortness of breath   . OSA (obstructive sleep apnea) 10/11/2012    Uses CPAP. AHI was .45/hr at 9 cm water pressure. RDI was 0.4/hr. Patient was unable to reach REM sleep and maintain the supine position at optimal pressure. Patient was able to sleep for prolonged periods of time at 9 cm water pressure without suffering further from respiratory events.  [name] GERD (gastroesophageal reflux disease)   . H/O hiatal hernia   .  Headache(784.0)   . Anemia   . Hyperlipidemia   . Coronary artery disease   . Weakness 10/06/12    Past Surgical History  Procedure Laterality Date  . Pacemaker revision  11/21/2012    BIV   UPGRADE    WITH DR Graciela Husbands  . Appendectomy    . Abdominal hysterectomy    . Bladder tack    . Cholecystectomy    . Coronary artery bypass graft  2003    With LIMA-LAD and vein graft to OM, and associated mitral valve repair with anuloplasty ring and quadrangular resection of the posterior leaflet, 24 mm Seguin ring by Dr. Cornelius Moras.  . Coronary angioplasty with stent placement  2012    No significant CAD, patent but ectatic graft supplying the ramus, patent LIMA-LAD, and an EF of 50% at that time. She had an occluded stent to her circumflex from remote intervention. Required recatheterization 05/2012 by Dr. Tresa Endo and had a patent LIMA-LAD with distal diffuse disease, patent SVG-OM, 10%-20% mid right coronary but an EF of about 35%, quantitatively it was 38.9.  [name] Cardiac catheterization  05/2012    (1st cath 2012) This required recatheterization by Dr. Tresa Endo and had a patent LIMA-LAD with distal diffuse disease, patent SVG-OM, 10% to 20% mid right coronary but an EF of about 35%, quantitatively iw was 38.9.  . Insert / replace / remove pacemaker  2007    Inserted 2007  . US echocardiography  04/13/12    EF - 25% to 35% with intraventricular  dyssynchrony, RVSP of 46, moderate TR, and mild to moderate AI. She had a mild increased mitral valve gradient of 5 mmHg with minimal MR.   [name] Lexiscan myocardial stress test  08/20/11    The EF = 34%. This is a low risk scan. There is a new fix abnormality in the distal LAD artery territory. Although no reversibility is seen, high grade obstruction with resting ischemia cannot be excluded.    Current Outpatient Prescriptions  Medication Sig Dispense Refill  . aspirin EC 81 MG tablet Take 81 mg by mouth daily.      . Calcium Carbonate-Vitamin D (CALCIUM 600/VITAMIN D)  600-400 MG-UNIT per tablet Take 1 tablet by mouth daily.      [name] ezetimibe (ZETIA) 10 MG tablet Take 1 tablet (10 mg total) by mouth daily.  30 tablet  10  . isosorbide mononitrate (IMDUR) 30 MG 24 hr tablet Take 1 tablet (30 mg total) by mouth daily.  30 tablet  11  . nebivolol (BYSTOLIC) 5 MG tablet Take 1 tablet (5 mg total) by mouth at bedtime.  30 tablet  11  . PARoxetine (PAXIL) 30 MG tablet Take 30 mg by mouth daily.      . polyvinyl alcohol (LIQUIFILM TEARS) 1.4 % ophthalmic solution Place 1 drop into both eyes 2 (two) times daily.      . rosuvastatin (CRESTOR) 10 MG tablet Take 1 tablet (10 mg total) by mouth daily.  30 tablet  6  . tizanidine (ZANAFLEX) 2 MG capsule Take 1 capsule (2 mg total) by mouth as directed. Start 1 capsule at bedtime x 1 week if tolerated increase to twice daily  60 capsule  2  . valsartan-hydrochlorothiazide (DIOVAN-HCT) 320-25 MG per tablet Take 1 tablet by mouth daily.       No current facility-administered medications for this visit.    Allergies  Allergen Reactions  . Dilaudid [Hydromorphone Hcl] Other (See Comments)    "jerking"  . Morphine And Related Other (See Comments)    "jerking"  . Sulfa Antibiotics Other (See Comments)    unknown    Review of Systems negative except from HPI and PMH  Physical Exam BP 113/61  Pulse 67  Ht 5\' 2"  (1.575 m)  Wt 122 lb (55.339 kg)  BMI 22.31 kg/m2 Well developed and well nourished in no acute distress HENT normal E scleral and icterus clear Neck Supple JVP flat; carotids brisk and full Clear to ausculation  Device pocket well healed; without hematoma or erythema.  There is no tethering Regular rate and rhythm, no murmurs gallops or rub Soft with active bowel sounds No clubbing cyanosis none Edema Alert and oriented, grossly normal motor and sensory function Skin Warm and Dry  ECG was reviewed from last and demonstrated P. synchronous pacing with a sense AV interval of about 200 ms  Assessment  and  Plan

## 2013-02-28 NOTE — Assessment & Plan Note (Signed)
Sable on current meds

## 2013-02-28 NOTE — Patient Instructions (Addendum)
Your physician wants you to follow-up in: 9 months with Dr. Klein. You will receive a reminder letter in the mail two months in advance. If you don't receive a letter, please call our office to schedule the follow-up appointment.  Your physician recommends that you continue on your current medications as directed. Please refer to the Current Medication list given to you today.    

## 2013-03-04 ENCOUNTER — Encounter: Payer: Self-pay | Admitting: *Deleted

## 2013-03-04 NOTE — Progress Notes (Signed)
Quick Note:  Echo results letter sent to patient. ______

## 2013-03-15 DIAGNOSIS — G5600 Carpal tunnel syndrome, unspecified upper limb: Secondary | ICD-10-CM | POA: Diagnosis not present

## 2013-03-17 DIAGNOSIS — G518 Other disorders of facial nerve: Secondary | ICD-10-CM | POA: Diagnosis not present

## 2013-03-17 DIAGNOSIS — IMO0001 Reserved for inherently not codable concepts without codable children: Secondary | ICD-10-CM | POA: Diagnosis not present

## 2013-03-17 DIAGNOSIS — R51 Headache: Secondary | ICD-10-CM | POA: Diagnosis not present

## 2013-03-17 DIAGNOSIS — M542 Cervicalgia: Secondary | ICD-10-CM | POA: Diagnosis not present

## 2013-03-27 ENCOUNTER — Encounter: Payer: Self-pay | Admitting: Internal Medicine

## 2013-03-30 DIAGNOSIS — M542 Cervicalgia: Secondary | ICD-10-CM | POA: Diagnosis not present

## 2013-03-30 DIAGNOSIS — G518 Other disorders of facial nerve: Secondary | ICD-10-CM | POA: Diagnosis not present

## 2013-03-30 DIAGNOSIS — R51 Headache: Secondary | ICD-10-CM | POA: Diagnosis not present

## 2013-03-30 DIAGNOSIS — IMO0001 Reserved for inherently not codable concepts without codable children: Secondary | ICD-10-CM | POA: Diagnosis not present

## 2013-04-25 ENCOUNTER — Ambulatory Visit: Payer: Medicare Other | Admitting: Neurology

## 2013-04-28 DIAGNOSIS — J029 Acute pharyngitis, unspecified: Secondary | ICD-10-CM | POA: Diagnosis not present

## 2013-05-04 ENCOUNTER — Ambulatory Visit: Payer: Medicare Other | Admitting: Cardiovascular Disease

## 2013-05-10 DIAGNOSIS — G43719 Chronic migraine without aura, intractable, without status migrainosus: Secondary | ICD-10-CM | POA: Diagnosis not present

## 2013-05-10 DIAGNOSIS — IMO0001 Reserved for inherently not codable concepts without codable children: Secondary | ICD-10-CM | POA: Diagnosis not present

## 2013-05-10 DIAGNOSIS — G518 Other disorders of facial nerve: Secondary | ICD-10-CM | POA: Diagnosis not present

## 2013-05-10 DIAGNOSIS — M542 Cervicalgia: Secondary | ICD-10-CM | POA: Diagnosis not present

## 2013-05-16 ENCOUNTER — Ambulatory Visit: Payer: Medicare Other | Admitting: Cardiovascular Disease

## 2013-05-16 ENCOUNTER — Telehealth: Payer: Self-pay | Admitting: Cardiovascular Disease

## 2013-05-16 NOTE — Telephone Encounter (Signed)
Returned call and pt verified x 2 w/ Leonette Most, pt's husband.  Stated pt has been seeing a neurologist for migraines.  Stated pt was rx'd nortriptyline for migraines and is already on Paxil (PCP).  Stated pharmacist told him to talk to the heart doctor to see if it's okay for pt to take both of these medications.  RN asked husband if he/pt notified neurologist that she was already taking Paxil.  Husband stated it's been on her med list and he (neurologist) had his laptop right in front of him when he said he was starting pt on a new med.  Husband informed he may need to contact Neuro and PCP to discuss best plan for pt in regards to both meds.  Husband asked that Dr. Tresa Endo be notified to let him know if pt can take the meds.  Informed he will be notified.  Verbalized understanding and agreed w/ plan.  Message forwarded to Dr. Tresa Endo.

## 2013-05-16 NOTE — Telephone Encounter (Signed)
Would like to know if Nortriptyline HCL 10 mg take one a day for a week , then 2 a day for a week ..is it ok to take with her Paxil 30mg  ... Please Call    Thanks

## 2013-05-17 NOTE — Telephone Encounter (Signed)
[name] reviewed the chart.  There is a possible side effect of prolonged QTc.  I will schedule the patient for an extender office visit for EKG review since her last EKG was in August 2014.  I left message for the patient to call me.

## 2013-05-17 NOTE — Telephone Encounter (Signed)
Pt husband said would you please give him a call. He said it would only take 30 seconds.

## 2013-05-17 NOTE — Telephone Encounter (Signed)
I spoke with the husband and he was asking about the interaction between the nortriptyline and paxil. (same question)  I advised patient that Dr Tresa Endo has not responded to the message, but I will review it with our pharmacist.

## 2013-05-17 NOTE — Telephone Encounter (Signed)
lmom 

## 2013-05-22 ENCOUNTER — Ambulatory Visit (INDEPENDENT_AMBULATORY_CARE_PROVIDER_SITE_OTHER): Payer: Medicare Other | Admitting: Cardiovascular Disease

## 2013-05-22 ENCOUNTER — Encounter: Payer: Self-pay | Admitting: Cardiovascular Disease

## 2013-05-22 VITALS — BP 124/60 | HR 70 | Ht 62.0 in | Wt 122.0 lb

## 2013-05-22 DIAGNOSIS — I2589 Other forms of chronic ischemic heart disease: Secondary | ICD-10-CM

## 2013-05-22 DIAGNOSIS — Z950 Presence of cardiac pacemaker: Secondary | ICD-10-CM | POA: Diagnosis not present

## 2013-05-22 DIAGNOSIS — I255 Ischemic cardiomyopathy: Secondary | ICD-10-CM

## 2013-05-22 DIAGNOSIS — Z8669 Personal history of other diseases of the nervous system and sense organs: Secondary | ICD-10-CM | POA: Insufficient documentation

## 2013-05-22 DIAGNOSIS — I10 Essential (primary) hypertension: Secondary | ICD-10-CM | POA: Diagnosis not present

## 2013-05-22 DIAGNOSIS — G4733 Obstructive sleep apnea (adult) (pediatric): Secondary | ICD-10-CM | POA: Diagnosis not present

## 2013-05-22 DIAGNOSIS — E785 Hyperlipidemia, unspecified: Secondary | ICD-10-CM

## 2013-05-22 DIAGNOSIS — I2510 Atherosclerotic heart disease of native coronary artery without angina pectoris: Secondary | ICD-10-CM | POA: Diagnosis not present

## 2013-05-22 DIAGNOSIS — Z9889 Other specified postprocedural states: Secondary | ICD-10-CM | POA: Insufficient documentation

## 2013-05-22 NOTE — Progress Notes (Signed)
Patient ID: [name], female   DOB: 02-24-1931, 79 y.o.   MRN: [id number]     HPI: [name] is a 79 y.o. female who presents for 31 month cardiology evaluation.  [name] is now 79 years old. She has a history of coronary artery disease and remotely had undergone intervention to an intermediate vessel. 2003 due to progressive CAD she underwent CABG surgery with a LIMA to the LAD, vein to the intermediate and at that time also had mitral valve repair with angioplasty ring. Her last cardiac catheterization in December 2013 showed an ejection fraction of 35-40%.   Her both pain and. She has significant native CAD with previously noted 80-85% distal left main stenosis, 70% ostial stenosis of her LAD proximal to the previously placed stent, total occlusion of the ramus intermediate vessel at the site of remote stenting. The circumflex was diminutive without stenosis. The RCA was a large dominant vessel with 20% luminal irregularities. She had dyskinetic segment in the distal anterolateral apical region. Initially, the patient had undergone permanent pacemaker insertion in 2007 for symptomatic bradycardia. An echo Doppler study in October 2013 showed reduction in ejection fraction to 25-35% with intraventricular dyssynchrony, moderate pulmonary hypertension with estimated RV systolic pressure 46 mm, moderate TR, and mild to moderate AR. She was referred to Dr. Graciela Husbands and in June 2014 had a biventricular upgrade revision. Saucerlike, she did have an echo Doppler study which was done on 02/21/2013. This now shows significantly improved LV function with an ejection fraction of 50-55%. There was grade 2 diastolic dysfunction. Her septal motion showed paradox. There was mild aortic regurgitation. Angioplasty mitral ring was present. There was mild left atrial dilatation. PA pressure was now 38 mm. [name] feels well from a cardiac standpoint. She has been bothered recently by almost daily migraine headaches.  She has been evaluated [facility] the [facility]. She has been on Paxil 30 mg for depression. He had apparently given her another round of trigger injections which were not very helpful. He also then gave her prescription for nortriptyline but she has not yet tried this to concerns of side effects and drug interactions.  Past Medical History  Diagnosis Date  . Chronic systolic heart failure 10/11/2012  . Ischemic cardiomyopathy- s/p CABG 10/11/2012    EF 39%  . Complete heart block 10/11/2012  . Pacemaker-Medtronic 10/11/2012  . Stress incontinence     multiple surgeries  . Anginal pain   . Hypertension   . Shortness of breath   . OSA (obstructive sleep apnea) 10/11/2012    Uses CPAP. AHI was .45/hr at 9 cm water pressure. RDI was 0.4/hr. Patient was unable to reach REM sleep and maintain the supine position at optimal pressure. Patient was able to sleep for prolonged periods of time at 9 cm water pressure without suffering further from respiratory events.  [name] GERD (gastroesophageal reflux disease)   . H/O hiatal hernia   . Headache(784.0)   . Anemia   . Hyperlipidemia   . Coronary artery disease   . Weakness 10/06/12    Past Surgical History  Procedure Laterality Date  . Pacemaker revision  11/21/2012    BIV   UPGRADE    WITH DR Graciela Husbands  . Appendectomy    . Abdominal hysterectomy    . Bladder tack    . Cholecystectomy    . Coronary artery bypass graft  2003    With LIMA-LAD and vein graft to OM, and associated mitral valve repair  with anuloplasty ring and quadrangular resection of the posterior leaflet, 24 mm Seguin ring by Dr. Cornelius Moras.  . Coronary angioplasty with stent placement  2012    No significant CAD, patent but ectatic graft supplying the ramus, patent LIMA-LAD, and an EF of 50% at that time. She had an occluded stent to her circumflex from remote intervention. Required recatheterization 05/2012 by Dr. Tresa Endo and had a patent LIMA-LAD with distal diffuse disease, patent SVG-OM, 10%-20%  mid right coronary but an EF of about 35%, quantitatively it was 38.9.  [name] Cardiac catheterization  05/2012    (1st cath 2012) This required recatheterization by Dr. Tresa Endo and had a patent LIMA-LAD with distal diffuse disease, patent SVG-OM, 10% to 20% mid right coronary but an EF of about 35%, quantitatively iw was 38.9.  . Insert / replace / remove pacemaker  2007    Inserted 2007  . US echocardiography  04/13/12    EF - 25% to 35% with intraventricular dyssynchrony, RVSP of 46, moderate TR, and mild to moderate AI. She had a mild increased mitral valve gradient of 5 mmHg with minimal MR.   [name] Lexiscan myocardial stress test  08/20/11    The EF = 34%. This is a low risk scan. There is a new fix abnormality in the distal LAD artery territory. Although no reversibility is seen, high grade obstruction with resting ischemia cannot be excluded.    Allergies  Allergen Reactions  . Dilaudid [Hydromorphone Hcl] Other (See Comments)    "jerking"  . Morphine And Related Other (See Comments)    "jerking"  . Sulfa Antibiotics Other (See Comments)    unknown    Current Outpatient Prescriptions  Medication Sig Dispense Refill  . aspirin EC 81 MG tablet Take 81 mg by mouth daily.      . Calcium Carbonate-Vitamin D (CALCIUM 600/VITAMIN D) 600-400 MG-UNIT per tablet Take 1 tablet by mouth daily.      . metoCLOPramide (REGLAN) 10 MG tablet Take 1 tablet by mouth as needed.      . nebivolol (BYSTOLIC) 5 MG tablet Take 2.5 mg by mouth at bedtime.      . ondansetron (ZOFRAN) 8 MG tablet Take 1 tablet by mouth as needed.      [name] PARoxetine (PAXIL) 30 MG tablet Take 30 mg by mouth daily.      . polyvinyl alcohol (LIQUIFILM TEARS) 1.4 % ophthalmic solution Place 1 drop into both eyes 2 (two) times daily.      . rosuvastatin (CRESTOR) 10 MG tablet Take 5 mg by mouth daily. 1/2 tab every other day. Total of 5 mg daily.      . valsartan-hydrochlorothiazide (DIOVAN-HCT) 320-25 MG per tablet Take 1 tablet by mouth  daily.       No current facility-administered medications for this visit.    History   Social History  . Marital Status: Married    Spouse Name: N/A    Number of Children: N/A  . Years of Education: N/A   Occupational History  . Not on file.   Social History Main Topics  . Smoking status: Never Smoker   . Smokeless tobacco: Never Used  . Alcohol Use: No  . Drug Use: No  . Sexual Activity: Not on file   Other Topics Concern  . Not on file   Social History Narrative  . No narrative on file   socially she is married has 3 children and 9 grandchildren.  Family History  Problem Relation Age  of Onset  . Cancer Father     ROS is negative for fevers, chills or night sweats.  She denies skin changes. She denies hearing issues. Her shortness of breath has improved. She denies cough or sputum production. She does have chronic headaches and was told that these are migraine. Remotely she was treated for possible tension headaches which Dr. Zachery Conch to stop Aleve is the case and he feels these are migraine headaches. She denies palpitations. He denies presyncope or syncope. She denies angina. She denies nausea or vomiting. She has had recurrent problems with her bladder. She denies claudication. There is no edema. She denies paresthesias. There is no diabetes. She denies cold or heat intolerance. She does have sleep apnea which is complex. Because of the headaches he has not been wearing her CPAP 22 certain that the mask was on the area of headache in the back of her head.  Other comprehensive 12 point system review is negative.  PE BP 124/60  Pulse 70  Ht 5\' 2"  (1.575 m)  Wt 122 lb (55.339 kg)  BMI 22.31 kg/m2  Repeat blood pressure 140/68 supine 132/66 standing General: Alert, oriented, no distress.  Skin: normal turgor, no rashes HEENT: Normocephalic, atraumatic. Pupils round and reactive; sclera anicteric;no lid lag.  Nose without nasal septal hypertrophy Mouth/Parynx benign;  Mallinpatti scale 3 Neck: No JVD, no carotid briuts Lungs: clear to ausculatation and percussion; no wheezing or rales Heart: RRR, s1 s2 normal 1 a 2/6 systolic murmur Abdomen: soft, nontender; no hepatosplenomehaly, BS+; abdominal aorta nontender and not dilated by palpation. Pulses 2+ Extremities: no clubbing cyanosis or edema, Homan's sign negative  Neurologic: grossly nonfocal Psychologic: normal affect and mood.  ECG: Appropriate atrial sensing and pacing with 100% ventricular pacing  LABS:  BMET    Component Value Date/Time   NA 131* 10/26/2012 1140   K 3.9 10/26/2012 1140   CL 91* 10/26/2012 1140   CO2 32 10/26/2012 1140   GLUCOSE 85 10/26/2012 1140   BUN 15 10/26/2012 1140   CREATININE 0.9 10/26/2012 1140   CALCIUM 9.4 10/26/2012 1140     Hepatic Function Panel     Component Value Date/Time   PROT 6.3 10/10/2010 1023   ALBUMIN 3.7 10/10/2010 1023   AST 68* 10/10/2010 1023   ALT 100* 10/10/2010 1023   ALKPHOS 218* 10/10/2010 1023   BILITOT 1.0 10/10/2010 1023   BILIDIR 0.2 10/10/2010 1023   IBILI 0.8 10/10/2010 1023     CBC    Component Value Date/Time   WBC 4.7 10/26/2012 1140   RBC 3.95 10/26/2012 1140   HGB 12.1 10/26/2012 1140   HCT 34.9* 10/26/2012 1140   PLT 133.0* 10/26/2012 1140   MCV 88.4 10/26/2012 1140   MCH 27.4 10/10/2010 1023   MCHC 34.7 10/26/2012 1140   RDW 14.5 10/26/2012 1140   LYMPHSABS 0.8 10/26/2012 1140   MONOABS 0.4 10/26/2012 1140   EOSABS 0.1 10/26/2012 1140   BASOSABS 0.0 10/26/2012 1140     BNP No results found for this basename: probnp    Lipid Panel  No results found for this basename: chol, trig, hdl, cholhdl, vldl, ldlcalc     RADIOLOGY: No results found.    ASSESSMENT AND PLAN:  [name] is now 11 years status post CABG revascularization surgery with a LIMA to her LAD and vein graft to obtuse marginal vessel. She also status post mitral valve repair with annuloplasty ring and quadrangular resection of posterior leaflet which was done by  Dr.  Cornelius Moras.  Savary maintained on her last catheterization. She did have reduction of LV function and following her most recent upgrade to a biventricular pacemaker LE function has improved with an ejection fraction most recently at 50-55%. She has seen Dr. [name] in followup of this upgrade. I did discuss with her the importance of continued CPAP therapy on a daily basis. We also discussed potential interactions of her medications regarding her migraine headache. I have suggested that if nortriptyline is to be used it maybe possible to consider weaning and discontinuing her Paxil to she'll followup with Dr. Zachery Conch. Presently, her blood pressure is stable without orthostasis is tolerating valsartan HCTZ treatment/25 .She is tolerating Crestor for lipid lowering. I will see her in 6 month for cardiology reevaluation.     Lennette Bihari, MD, [facility]  05/22/2013 5:42 PM

## 2013-05-22 NOTE — Patient Instructions (Signed)
Your physician recommends that you schedule a follow-up appointment in: 6 MONTHS. No changes were made today in your therapy. 

## 2013-05-22 NOTE — Telephone Encounter (Signed)
Patient has an appointment with Dr Tresa Endo this afternoon to address the medication.

## 2013-05-23 ENCOUNTER — Encounter: Payer: Self-pay | Admitting: Cardiovascular Disease

## 2013-05-24 DIAGNOSIS — M810 Age-related osteoporosis without current pathological fracture: Secondary | ICD-10-CM | POA: Diagnosis not present

## 2013-05-24 DIAGNOSIS — I10 Essential (primary) hypertension: Secondary | ICD-10-CM | POA: Diagnosis not present

## 2013-05-24 DIAGNOSIS — E559 Vitamin D deficiency, unspecified: Secondary | ICD-10-CM | POA: Diagnosis not present

## 2013-05-24 DIAGNOSIS — E782 Mixed hyperlipidemia: Secondary | ICD-10-CM | POA: Diagnosis not present

## 2013-05-24 DIAGNOSIS — Z780 Asymptomatic menopausal state: Secondary | ICD-10-CM | POA: Diagnosis not present

## 2013-05-24 DIAGNOSIS — R7309 Other abnormal glucose: Secondary | ICD-10-CM | POA: Diagnosis not present

## 2013-05-24 DIAGNOSIS — Z130 Encounter for screening for diseases of the blood and blood-forming organs and certain disorders involving the immune mechanism: Secondary | ICD-10-CM | POA: Diagnosis not present

## 2013-05-24 DIAGNOSIS — Z1329 Encounter for screening for other suspected endocrine disorder: Secondary | ICD-10-CM | POA: Diagnosis not present

## 2013-06-06 DIAGNOSIS — R072 Precordial pain: Secondary | ICD-10-CM | POA: Diagnosis not present

## 2013-06-06 DIAGNOSIS — R0602 Shortness of breath: Secondary | ICD-10-CM | POA: Diagnosis not present

## 2013-06-06 DIAGNOSIS — N390 Urinary tract infection, site not specified: Secondary | ICD-10-CM | POA: Diagnosis not present

## 2013-06-06 DIAGNOSIS — R079 Chest pain, unspecified: Secondary | ICD-10-CM | POA: Diagnosis not present

## 2013-06-06 DIAGNOSIS — R5381 Other malaise: Secondary | ICD-10-CM | POA: Diagnosis not present

## 2013-06-06 DIAGNOSIS — I200 Unstable angina: Secondary | ICD-10-CM | POA: Diagnosis not present

## 2013-06-07 ENCOUNTER — Inpatient Hospital Stay (HOSPITAL_COMMUNITY)
Admission: RE | Admit: 2013-06-07 | Discharge: 2013-06-07 | DRG: 287 | Disposition: A | Discharge status: Home / Self Care | Payer: Medicare Other | Source: Other Acute Inpatient Hospital | Attending: Cardiovascular Disease | Admitting: Cardiovascular Disease

## 2013-06-07 ENCOUNTER — Encounter (HOSPITAL_COMMUNITY): Payer: Self-pay | Admitting: *Deleted

## 2013-06-07 ENCOUNTER — Encounter (HOSPITAL_COMMUNITY)
Admission: RE | Disposition: A | Discharge status: Home / Self Care | Payer: Self-pay | Source: Other Acute Inpatient Hospital | Attending: Cardiovascular Disease

## 2013-06-07 DIAGNOSIS — G4733 Obstructive sleep apnea (adult) (pediatric): Secondary | ICD-10-CM | POA: Diagnosis present

## 2013-06-07 DIAGNOSIS — I255 Ischemic cardiomyopathy: Secondary | ICD-10-CM

## 2013-06-07 DIAGNOSIS — T82897A Other specified complication of cardiac prosthetic devices, implants and grafts, initial encounter: Secondary | ICD-10-CM | POA: Diagnosis not present

## 2013-06-07 DIAGNOSIS — Z9861 Coronary angioplasty status: Secondary | ICD-10-CM | POA: Diagnosis not present

## 2013-06-07 DIAGNOSIS — R071 Chest pain on breathing: Secondary | ICD-10-CM

## 2013-06-07 DIAGNOSIS — E785 Hyperlipidemia, unspecified: Secondary | ICD-10-CM | POA: Diagnosis present

## 2013-06-07 DIAGNOSIS — I2510 Atherosclerotic heart disease of native coronary artery without angina pectoris: Secondary | ICD-10-CM | POA: Diagnosis present

## 2013-06-07 DIAGNOSIS — Z9889 Other specified postprocedural states: Secondary | ICD-10-CM

## 2013-06-07 DIAGNOSIS — R079 Chest pain, unspecified: Secondary | ICD-10-CM | POA: Diagnosis not present

## 2013-06-07 DIAGNOSIS — Z951 Presence of aortocoronary bypass graft: Secondary | ICD-10-CM

## 2013-06-07 DIAGNOSIS — Z7982 Long term (current) use of aspirin: Secondary | ICD-10-CM | POA: Diagnosis not present

## 2013-06-07 DIAGNOSIS — I200 Unstable angina: Secondary | ICD-10-CM | POA: Diagnosis present

## 2013-06-07 DIAGNOSIS — I2589 Other forms of chronic ischemic heart disease: Secondary | ICD-10-CM | POA: Diagnosis present

## 2013-06-07 DIAGNOSIS — Z79899 Other long term (current) drug therapy: Secondary | ICD-10-CM | POA: Diagnosis not present

## 2013-06-07 DIAGNOSIS — I10 Essential (primary) hypertension: Secondary | ICD-10-CM | POA: Diagnosis present

## 2013-06-07 DIAGNOSIS — I509 Heart failure, unspecified: Secondary | ICD-10-CM | POA: Diagnosis not present

## 2013-06-07 DIAGNOSIS — Y849 Medical procedure, unspecified as the cause of abnormal reaction of the patient, or of later complication, without mention of misadventure at the time of the procedure: Secondary | ICD-10-CM | POA: Diagnosis present

## 2013-06-07 DIAGNOSIS — I2789 Other specified pulmonary heart diseases: Secondary | ICD-10-CM | POA: Diagnosis present

## 2013-06-07 DIAGNOSIS — I5042 Chronic combined systolic (congestive) and diastolic (congestive) heart failure: Secondary | ICD-10-CM | POA: Diagnosis not present

## 2013-06-07 DIAGNOSIS — R0789 Other chest pain: Secondary | ICD-10-CM | POA: Diagnosis not present

## 2013-06-07 DIAGNOSIS — I498 Other specified cardiac arrhythmias: Secondary | ICD-10-CM | POA: Diagnosis present

## 2013-06-07 DIAGNOSIS — I359 Nonrheumatic aortic valve disorder, unspecified: Secondary | ICD-10-CM | POA: Diagnosis present

## 2013-06-07 DIAGNOSIS — I5032 Chronic diastolic (congestive) heart failure: Secondary | ICD-10-CM

## 2013-06-07 DIAGNOSIS — I442 Atrioventricular block, complete: Secondary | ICD-10-CM

## 2013-06-07 DIAGNOSIS — Z950 Presence of cardiac pacemaker: Secondary | ICD-10-CM

## 2013-06-07 DIAGNOSIS — Z95810 Presence of automatic (implantable) cardiac defibrillator: Secondary | ICD-10-CM | POA: Diagnosis not present

## 2013-06-07 HISTORY — PX: LEFT HEART CATHETERIZATION WITH CORONARY ANGIOGRAM: SHX5451

## 2013-06-07 LAB — CBC WITH DIFFERENTIAL/PLATELET
Basophils Absolute: 0 10*3/uL (ref 0.0–0.1)
Basophils Relative: 0 % (ref 0–1)
Eosinophils Absolute: 0.1 10*3/uL (ref 0.0–0.7)
Eosinophils Relative: 3 % (ref 0–5)
HCT: 28.6 % — ABNORMAL LOW (ref 36.0–46.0)
Hemoglobin: 10.2 g/dL — ABNORMAL LOW (ref 12.0–15.0)
MCH: 30.4 pg (ref 26.0–34.0)
MCHC: 35.7 g/dL (ref 30.0–36.0)
MCV: 85.1 fL (ref 78.0–100.0)
Monocytes Absolute: 0.3 10*3/uL (ref 0.1–1.0)
Monocytes Relative: 8 % (ref 3–12)
Platelets: 111 10*3/uL — ABNORMAL LOW (ref 150–400)

## 2013-06-07 LAB — PROTIME-INR
INR: 0.99 (ref 0.00–1.49)
Prothrombin Time: 12.9 seconds (ref 11.6–15.2)

## 2013-06-07 LAB — COMPREHENSIVE METABOLIC PANEL
AST: 17 U/L (ref 0–37)
Albumin: 3.5 g/dL (ref 3.5–5.2)
Alkaline Phosphatase: 58 U/L (ref 39–117)
BUN: 21 mg/dL (ref 6–23)
Calcium: 9.5 mg/dL (ref 8.4–10.5)
Chloride: 95 mEq/L — ABNORMAL LOW (ref 96–112)
Creatinine, Ser: 1.05 mg/dL (ref 0.50–1.10)
Total Bilirubin: 0.2 mg/dL — ABNORMAL LOW (ref 0.3–1.2)
Total Protein: 6 g/dL (ref 6.0–8.3)

## 2013-06-07 LAB — LIPID PANEL
Cholesterol: 123 mg/dL (ref 0–200)
HDL: 33 mg/dL — ABNORMAL LOW (ref >39)
LDL Cholesterol: 36 mg/dL (ref 0–99)
Triglycerides: 269 mg/dL — ABNORMAL HIGH (ref <150)
VLDL: 54 mg/dL — ABNORMAL HIGH (ref 0–40)

## 2013-06-07 LAB — TROPONIN I
Troponin I: <0.3 ng/mL (ref <0.30)
Troponin I: <0.3 ng/mL (ref <0.30)

## 2013-06-07 SURGERY — LEFT HEART CATHETERIZATION WITH CORONARY ANGIOGRAM
Anesthesia: LOCAL

## 2013-06-07 MED ORDER — LIDOCAINE HCL (PF) 1 % IJ SOLN
INTRAMUSCULAR | Status: AC
  Filled 2013-06-07: qty 30

## 2013-06-07 MED ORDER — NITROGLYCERIN 0.4 MG SL SUBL: 0.4000 mg | SUBLINGUAL_TABLET | SUBLINGUAL | Status: DC | PRN

## 2013-06-07 MED ORDER — VALSARTAN-HYDROCHLOROTHIAZIDE 320-25 MG PO TABS: 1.0000 | ORAL_TABLET | Freq: Every day | ORAL | Status: DC

## 2013-06-07 MED ORDER — ACETAMINOPHEN 325 MG PO TABS: 650.0000 mg | ORAL_TABLET | ORAL | Status: DC | PRN

## 2013-06-07 MED ORDER — ASPIRIN 81 MG PO CHEW: 81.0000 mg | CHEWABLE_TABLET | Freq: Every day | ORAL | Status: DC

## 2013-06-07 MED ORDER — SODIUM CHLORIDE 0.9 % IV SOLN
INTRAVENOUS | Status: AC
  Administered 2013-06-07: 14:00:00 via INTRAVENOUS

## 2013-06-07 MED ORDER — FENTANYL CITRATE 0.05 MG/ML IJ SOLN
INTRAMUSCULAR | Status: AC
  Filled 2013-06-07: qty 2

## 2013-06-07 MED ORDER — MIDAZOLAM HCL 2 MG/2ML IJ SOLN
INTRAMUSCULAR | Status: AC
  Filled 2013-06-07: qty 2

## 2013-06-07 MED ORDER — HYDROCHLOROTHIAZIDE 25 MG PO TABS
25.0000 mg | ORAL_TABLET | Freq: Every day | ORAL | Status: DC
  Administered 2013-06-07: 25 mg via ORAL
  Filled 2013-06-07: qty 1

## 2013-06-07 MED ORDER — IRBESARTAN 300 MG PO TABS
300.0000 mg | ORAL_TABLET | Freq: Every day | ORAL | Status: DC
  Administered 2013-06-07: 300 mg via ORAL
  Filled 2013-06-07: qty 1

## 2013-06-07 MED ORDER — NITROGLYCERIN IN D5W 200-5 MCG/ML-% IV SOLN: 3.0000 ug/min | INTRAVENOUS | Status: DC

## 2013-06-07 MED ORDER — NEBIVOLOL HCL 2.5 MG PO TABS
2.5000 mg | ORAL_TABLET | Freq: Every day | ORAL | Status: DC
  Filled 2013-06-07: qty 1

## 2013-06-07 MED ORDER — HEPARIN (PORCINE) IN NACL 2-0.9 UNIT/ML-% IJ SOLN
INTRAMUSCULAR | Status: AC
  Filled 2013-06-07: qty 1000

## 2013-06-07 MED ORDER — ATORVASTATIN CALCIUM 10 MG PO TABS
10.0000 mg | ORAL_TABLET | Freq: Every day | ORAL | Status: DC
  Filled 2013-06-07: qty 1

## 2013-06-07 MED ORDER — ONDANSETRON HCL 4 MG/2ML IJ SOLN: 4.0000 mg | Freq: Four times a day (QID) | INTRAMUSCULAR | Status: DC | PRN

## 2013-06-07 MED ORDER — NITROGLYCERIN 0.2 MG/ML ON CALL CATH LAB
INTRAVENOUS | Status: AC
  Filled 2013-06-07: qty 1

## 2013-06-07 MED ORDER — PAROXETINE HCL 30 MG PO TABS
30.0000 mg | ORAL_TABLET | Freq: Every day | ORAL | Status: DC
  Administered 2013-06-07: 30 mg via ORAL
  Filled 2013-06-07: qty 1

## 2013-06-07 MED ORDER — SODIUM CHLORIDE 0.9 % IV SOLN
INTRAVENOUS | Status: DC
  Administered 2013-06-07: 20 mL/h via INTRAVENOUS

## 2013-06-07 MED ORDER — ASPIRIN EC 81 MG PO TBEC
81.0000 mg | DELAYED_RELEASE_TABLET | Freq: Every day | ORAL | Status: DC
  Administered 2013-06-07: 81 mg via ORAL
  Filled 2013-06-07: qty 1

## 2013-06-07 MED ORDER — ENOXAPARIN SODIUM 60 MG/0.6ML ~~LOC~~ SOLN
55.0000 mg | Freq: Two times a day (BID) | SUBCUTANEOUS | Status: DC
  Filled 2013-06-07 (×2): qty 0.6

## 2013-06-07 MED FILL — Fentanyl Citrate Inj 0.05 MG/ML: INTRAMUSCULAR | Qty: 2 | Status: AC

## 2013-06-07 NOTE — Discharge Summary (Signed)
Physician Discharge Summary  Patient ID: [name] MRN: [id number] DOB/AGE: 77-May-1932 32 y.o.  Admit date: 06/07/2013 Discharge date: 06/07/2013  Admission Diagnoses: Unstable Angina  Discharge Diagnoses:  Principal Problem:   Chest pain with low risk for cardiac etiology - patent grafts with normal EF of 60% w/o WMA on cath 06/07/13 Active Problems:   Pacemaker-Medtronic-CRT   CAD (coronary artery disease)   Hyperlipidemia LDL goal < 70   History of mitral valve repair   Unstable angina   Discharged Condition: stable  Hospital Course: The patient is a 79 y/o female, followed by Dr. Tresa Endo, who was admitted to [facility] on 06/07/13, for evaluation of chest pain. She has a history of known coronary disease with prior CABG in 2003 with a LIMA-LAD and vein graft to OM, and associated mitral valve repair with anuloplasty ring and quadrangular resection of the posterior leaflet, 24 mm Seguin ring by Dr. Cornelius Moras. Catheterization in 2012 with no significant CAD, patent but ectatic graft supplying the ramus, patent LIMA-LAD, and an EF of 50% at that time. She had an occluded stent to her circumflex from remote intervention. She required recatheterization December 2013 by Dr. Tresa Endo and had a patent LIMA-LAD with distal diffuse disease, patent SVG-OM, 10% to 20% mid right coronary but an EF of about 35%, quantitatively it was 38.9. She has a permanent pacemaker, history of hyperlipidemia and obstructive sleep apnea, on CPAP. Pacemaker was implanted in 2007 for symptomatic bradycardia. A followup echo on April 13, 2012, showed an EF of 25% to 35% with intraventricular dyssynchrony. She had also developed a chronic pacer-induced left bundle with wide QRS complex. She was referred to Dr. Graciela Husbands and in June 2014 had a biventricular upgrade revision. Repeat 2D echo, done in September showed significantly improved LV function with an ejection fraction of 50-55%. There was grade 2 diastolic  dysfunction.  She was admitted by Dr. Mayford Knife on 12/17 for chest pain that was concerning for unstable angina. She was admitted to stepdown and placed on IV heparin and IV NTG.  Her pain improved. Her EKG was w/o any ischemic changes. Cardiac enzymes were cycled and were negative x 3. Given her past cardiac history and symptomatolgy, a diagnostic LHC was recommended. The procedure was performed by Dr. Allyson Sabal, via the right femoral artery. She was found to have  patent grafts a patent dominant RCA with normal LV function. Her anatomy was unchanged compared to her prior cath one year ago. There was no identifiable culprit lesion. The overall LVEF was estimated at  60 %, without wall motion abnormalities. Continued medical therapy was recommended. She left the cath lab in stable condition. She had no post-cath complications. The right femoral access site remained stable. She was last seen and examined by Dr.  Allyson Sabal, who determined she was stable for discharge. She will f/u with Dr. Tresa Endo in clinic.     Consults: None  Significant Diagnostic Studies:   LHC 06/07/13 HEMODYNAMICS:  AO SYSTOLIC/AO DIASTOLIC: 141/47  LV SYSTOLIC/LV DIASTOLIC: 142/8  ANGIOGRAPHIC RESULTS:  1. Left main; 90% distal  2. LAD; 90% ostial with moderate in-stent restenosis within the proximal LAD stent. There was competitive flow distally. The first diagonal branch was was a small to medium-size vessel that arose from the proximal third of the LAD and was unprotected  3. Left circumflex; small diminutive and free of significant disease. There was a large ramus branch which had a stent in the proximal portion was occluded and ultimately bypassed.  4.  Right coronary artery; dominant with at most 50-60% ostial posterior descending artery stenosis  5.LIMA TO LAD; widely patent  6. SVG TO ramus branch was widely patent  7. Left ventriculography; RAO left ventriculogram was performed using  25 mL of Visipaque dye at 12 mL/second.  The overall LVEF estimated  60 % Without wall motion abnormalities   Treatments: See Hospital Course  Discharge Exam: Blood pressure 137/40, pulse 64, temperature 97.9 F (36.6 C), temperature source Oral, resp. rate 20, height 5\' 2"  (1.575 m), weight 122 lb 12.7 oz (55.7 kg), SpO2 99.00%.   Disposition: 01-Home or Self Care       Future Appointments Provider Department Dept Phone   07/05/2013 1:30 PM Lennette Bihari, MD [facility] [phone]       Medication List         aspirin EC 81 MG tablet  Take 81 mg by mouth daily.     CALCIUM 600/VITAMIN D 600-400 MG-UNIT per tablet  Generic drug:  Calcium Carbonate-Vitamin D  Take 1 tablet by mouth daily.     hydroxypropyl methylcellulose 2.5 % ophthalmic solution  Commonly known as:  ISOPTO TEARS  Place 2 drops into both eyes daily.     nebivolol 5 MG tablet  Commonly known as:  BYSTOLIC  Take 2.5 mg by mouth at bedtime.     nitroGLYCERIN 0.4 MG SL tablet  Commonly known as:  NITROSTAT  Place 1 tablet (0.4 mg total) under the tongue every 5 (five) minutes x 3 doses as needed for chest pain.     ondansetron 8 MG tablet  Commonly known as:  ZOFRAN  Take 8 mg by mouth every 8 (eight) hours as needed for nausea or vomiting.     PARoxetine 30 MG tablet  Commonly known as:  PAXIL  Take 30 mg by mouth every evening.     rosuvastatin 10 MG tablet  Commonly known as:  CRESTOR  Take 5 mg by mouth every other day.     valsartan-hydrochlorothiazide 320-25 MG per tablet  Commonly known as:  DIOVAN-HCT  Take 1 tablet by mouth daily.       Follow-up Information   Follow up with Lennette Bihari, MD On 07/05/2013. (1:30 pm)    Specialty:  Cardiology   Contact information:   [address] [place] [address] [phone]      TIME SPENT ON DISCHARGE, INCLUDING PHYSICIAN TIME: >30 MINTUES  Signed: BRITTAINY SIMMONS, PA-C 06/07/2013, 4:11 PM

## 2013-06-07 NOTE — Progress Notes (Addendum)
Pt. Seen and examined. Agree with the NP/PA-C note as written. Pleasant 79 yo female with known CAD and prior CABG. In 05/2012, cath showed patent grafts (LIMA to LAD and SVG to RI), but there is distal LAD disease and ostial native LAD stenosis - the LCX is diminutive, however. There is mild, non-obstructive disease to the RCA. She is also s/p CRT-P this past Summer with improvement in LVEF to 50-55% by echo in 02/2013. She recently saw Dr. KIein in September who felt it was ok to stop her isosorbide. She also saw Dr. Kelly a few weeks ago and was doing well. She does note that over the past week she has had some more shortness of breath and chest pressure and had been taking short-acting nitro. A few days ago she restarted her isosorbide. Last night, after eating dinner (nothing different for her) she developed 8-9/10 SSCP, non-radiating.  Symptoms were minimally relieved with 2 nitro that she took when she got home. She also took TUMS without relief and decided to present to [facility]. She was transferred to [facility]. Initial troponin was negative and EKG shows a BI-V paced rhythm.  She currently appears comfortable with 1/10 SSCP on 30 ug/min of nitro.  Symptom pattern concerning for unstable angina, possibly unmasking underlying obstructive disease with recent discontinuation of her imdur. She also has a history of GERD/hiatal hernia and takes reglan - this could also represent an esophageal spasm or be GI related.  I would recommend a definitive coronary assessment by cardiac catheterization. If it is grossly unchanged, then it may be beneficial to focus on GI etiology - reasonable to continue long-acting nitrates.  Keep NPO for LHC today.  Kess Mcilwain C. Cherilyn Sautter, MD, FACC Attending Cardiologist [facility]   

## 2013-06-07 NOTE — Progress Notes (Signed)
Reviewed AVS with patient and her husband using the teachback method. Pt denies any further questions. Discharged home with husband via private car.   Dawson Bills, RN

## 2013-06-07 NOTE — Interval H&P Note (Signed)
Cath Lab Visit (complete for each Cath Lab visit)  Clinical Evaluation Leading to the Procedure:   ACS: yes  Non-ACS:    Anginal Classification: CCS IV  Anti-ischemic medical therapy: Maximal Therapy (2 or more classes of medications)  Non-Invasive Test Results: No non-invasive testing performed  Prior CABG: Previous CABG      History and Physical Interval Note:  06/07/2013 11:52 AM  [name]  has presented today for surgery, with the diagnosis of cp  The various methods of treatment have been discussed with the patient and family. After consideration of risks, benefits and other options for treatment, the patient has consented to  Procedure(s): LEFT HEART CATHETERIZATION WITH CORONARY ANGIOGRAM (N/A) as a surgical intervention .  The patient's history has been reviewed, patient examined, no change in status, stable for surgery.  I have reviewed the patient's chart and labs.  Questions were answered to the patient's satisfaction.     [name]

## 2013-06-07 NOTE — H&P (Signed)
Admit date: 06/07/2013 Referring Physician [facility] Primary Cardiologist Nicki Guadalajara Chief complaint/reason for admission: chest pain  HPI: [name] is  79 years old. She has a history of coronary artery disease and remotely had undergone intervention to an intermediate vessel. 2003 due to progressive CAD she underwent CABG surgery with a LIMA to the LAD, vein to the intermediate and at that time also had mitral valve repair with angioplasty ring. Her last cardiac catheterization in December 2013 showed an ejection fraction of 35-40%.  She has significant native CAD with previously noted 80-85% distal left main stenosis, 70% ostial stenosis of her LAD proximal to the previously placed stent, total occlusion of the ramus intermediate vessel at the site of remote stenting. The circumflex was diminutive without stenosis. The RCA was a large dominant vessel with 20% luminal irregularities. She had dyskinetic segment in the distal anterolateral apical region. Initially, the patient had undergone permanent pacemaker insertion in 2007 for symptomatic bradycardia. An echo Doppler study in October 2013 showed reduction in ejection fraction to 25-35% with intraventricular dyssynchrony, moderate pulmonary hypertension with estimated RV systolic pressure 46 mm, moderate TR, and mild to moderate AR. She was referred to Dr. Graciela Husbands and in June 2014 had a biventricular upgrade revision. She did have an echo Doppler study which was done on 02/21/2013. This now shows significantly improved LV function with an ejection fraction of 50-55%. There was grade 2 diastolic dysfunction. Her septal motion showed paradox. There was mild aortic regurgitation. Angioplasty mitral ring was present. There was mild left atrial dilatation. PA pressure was now 38 mm.   She was in her USOH until last night around 8:30pm after eating dinner.  She developed substernal chest pressure across her chest and took 3 SL NTG with minimal relief.  She  denied any diaphoresis, nausea or SOB.  She went to the ER where initial cardiac enzymes were normal and EKG showed  NSR with V paced rhythm.  Due to her extensive cardiac history she was transferred to [facility].  She currently complains of 1/10 CP on IV NTG gtt.    PMH:    Past Medical History  Diagnosis Date  . Chronic systolic heart failure 10/11/2012  . Ischemic cardiomyopathy- s/p CABG 10/11/2012    EF 39%  . Complete heart block 10/11/2012  . Pacemaker-Medtronic 10/11/2012  . Stress incontinence     multiple surgeries  . Anginal pain   . Hypertension   . Shortness of breath   . OSA (obstructive sleep apnea) 10/11/2012    Uses CPAP. AHI was .45/hr at 9 cm water pressure. RDI was 0.4/hr. Patient was unable to reach REM sleep and maintain the supine position at optimal pressure. Patient was able to sleep for prolonged periods of time at 9 cm water pressure without suffering further from respiratory events.  [name] GERD (gastroesophageal reflux disease)   . H/O hiatal hernia   . Headache(784.0)   . Anemia   . Hyperlipidemia   . Coronary artery disease   . Weakness 10/06/12    PSH:    Past Surgical History  Procedure Laterality Date  . Pacemaker revision  11/21/2012    BIV   UPGRADE    WITH DR Graciela Husbands  . Appendectomy    . Abdominal hysterectomy    . Bladder tack    . Cholecystectomy    . Coronary artery bypass graft  2003    With LIMA-LAD and vein graft to OM, and associated mitral valve repair with anuloplasty ring  and quadrangular resection of the posterior leaflet, 24 mm Seguin ring by Dr. Cornelius Moras.  . Coronary angioplasty with stent placement  2012    No significant CAD, patent but ectatic graft supplying the ramus, patent LIMA-LAD, and an EF of 50% at that time. She had an occluded stent to her circumflex from remote intervention. Required recatheterization 05/2012 by Dr. Tresa Endo and had a patent LIMA-LAD with distal diffuse disease, patent SVG-OM, 10%-20% mid right coronary but an EF of about  35%, quantitatively it was 38.9.  [name] Cardiac catheterization  05/2012    (1st cath 2012) This required recatheterization by Dr. Tresa Endo and had a patent LIMA-LAD with distal diffuse disease, patent SVG-OM, 10% to 20% mid right coronary but an EF of about 35%, quantitatively iw was 38.9.  . Insert / replace / remove pacemaker  2007    Inserted 2007  . US echocardiography  04/13/12    EF - 25% to 35% with intraventricular dyssynchrony, RVSP of 46, moderate TR, and mild to moderate AI. She had a mild increased mitral valve gradient of 5 mmHg with minimal MR.   [name] Lexiscan myocardial stress test  08/20/11    The EF = 34%. This is a low risk scan. There is a new fix abnormality in the distal LAD artery territory. Although no reversibility is seen, high grade obstruction with resting ischemia cannot be excluded.    ALLERGIES:   Dilaudid; Morphine and related; and Sulfa antibiotics  Prior to Admit Meds:   Prescriptions prior to admission  Medication Sig Dispense Refill  . aspirin EC 81 MG tablet Take 81 mg by mouth daily.      . Calcium Carbonate-Vitamin D (CALCIUM 600/VITAMIN D) 600-400 MG-UNIT per tablet Take 1 tablet by mouth daily.      . nebivolol (BYSTOLIC) 5 MG tablet Take 2.5 mg by mouth at bedtime.      . ondansetron (ZOFRAN) 8 MG tablet Take 1 tablet by mouth as needed.      [name] PARoxetine (PAXIL) 30 MG tablet Take 30 mg by mouth daily.      . polyvinyl alcohol (LIQUIFILM TEARS) 1.4 % ophthalmic solution Place 1 drop into both eyes 2 (two) times daily.      . rosuvastatin (CRESTOR) 10 MG tablet Take 5 mg by mouth daily. 1/2 tab every other day. Total of 5 mg daily.      . valsartan-hydrochlorothiazide (DIOVAN-HCT) 320-25 MG per tablet Take 1 tablet by mouth daily.       Family HX:    Family History  Problem Relation Age of Onset  . Cancer Father    Social HX:    History   Social History  . Marital Status: Married    Spouse Name: N/A    Number of Children: N/A  . Years of Education: N/A    Occupational History  . Not on file.   Social History Main Topics  . Smoking status: Never Smoker   . Smokeless tobacco: Never Used  . Alcohol Use: No  . Drug Use: No  . Sexual Activity: Not on file   Other Topics Concern  . Not on file   Social History Narrative  . No narrative on file     ROS:  All 11 ROS were addressed and are negative except what is stated in the HPI  PHYSICAL EXAM There were no vitals filed for this visit. General: Well developed, well nourished, in no acute distress Head: Eyes PERRLA, No xanthomas.   Normal  cephalic and atramatic  Lungs:   Clear bilaterally to auscultation and percussion. Heart:   HRRR S1 S2 Pulses are 2+ & equal.            No carotid bruit. No JVD.  No abdominal bruits. No femoral bruits. Abdomen: Bowel sounds are positive, abdomen soft and non-tender without masses  Extremities:   No clubbing, cyanosis or edema.  DP +1 Neuro: Alert and oriented X 3. Psych:  Good affect, responds appropriately   Labs:   Lab Results  Component Value Date   WBC 4.7 10/26/2012   HGB 12.1 10/26/2012   HCT 34.9* 10/26/2012   MCV 88.4 10/26/2012   PLT 133.0* 10/26/2012   No results found for this basename: NA, K, CL, CO2, BUN, CREATININE, CALCIUM, LABALBU, PROT, BILITOT, ALKPHOS, ALT, AST, GLUCOSE,  in the last 168 hours No results found for this basename: CKTOTAL, CKMB, CKMBINDEX, TROPONINI   No results found for this basename: PTT   No results found for this basename: INR, PROTIME         Radiology:    EKG:  NSR with V paced rhythm  ASSESSMENT:  1.  Unstable angina with initial cardiac enzymes normal.  EKG shows paced rhythm 2.  ASCAD with extensive cardiac history including remote CABG and 3 stents 3.  Symptomatic bradycardia s/p PPM and then upgrade to biventricular AICD 11/2012  4.  Ischemic DCM EF 25-35% now with most recent echo showing EF 50-55% 5.  Chronic combined systolic./diastolic CHF - compensated 6.  HTN 7.  OSA on CPAP  therapy  PLAN:   1.  Admit to stepdown 2.  Cycle cardiac enzymes 3.  SQ Lovenox - she received a full dose (1mg /kg) at 12 MN 4.  IV NTG gtt 5.  ASA/beta blocker/statin/ARB 6.  NPO 7.  Further workup of CP per Dr. Dorthey Sawyer, MD  06/07/2013  2:41 AM

## 2013-06-07 NOTE — H&P (View-Only) (Signed)
Pt. Seen and examined. Agree with the NP/PA-C note as written. Pleasant 79 yo female with known CAD and prior CABG. In 05/2012, cath showed patent grafts (LIMA to LAD and SVG to RI), but there is distal LAD disease and ostial native LAD stenosis - the LCX is diminutive, however. There is mild, non-obstructive disease to the RCA. She is also s/p CRT-P this past Summer with improvement in LVEF to 50-55% by echo in 02/2013. She recently saw Dr. Roney Jaffe in September who felt it was ok to stop her isosorbide. She also saw Dr. Tresa Endo a few weeks ago and was doing well. She does note that over the past week she has had some more shortness of breath and chest pressure and had been taking short-acting nitro. A few days ago she restarted her isosorbide. Last night, after eating dinner (nothing different for her) she developed 8-9/10 SSCP, non-radiating.  Symptoms were minimally relieved with 2 nitro that she took when she got home. She also took TUMS without relief and decided to present to [facility]. She was transferred to [facility]. Initial troponin was negative and EKG shows a BI-V paced rhythm.  She currently appears comfortable with 1/10 SSCP on 30 ug/min of nitro.  Symptom pattern concerning for unstable angina, possibly unmasking underlying obstructive disease with recent discontinuation of her imdur. She also has a history of GERD/hiatal hernia and takes reglan - this could also represent an esophageal spasm or be GI related.  I would recommend a definitive coronary assessment by cardiac catheterization. If it is grossly unchanged, then it may be beneficial to focus on GI etiology - reasonable to continue long-acting nitrates.  Keep NPO for LHC today.  Chrystie Nose, MD, [facility] Attending Cardiologist [facility]

## 2013-06-07 NOTE — Progress Notes (Signed)
ANTICOAGULATION CONSULT NOTE - Initial Consult  Pharmacy Consult for Lovenox Indication: chest pain/ACS  Allergies  Allergen Reactions  . Dilaudid [Hydromorphone Hcl] Other (See Comments)    "jerking"  . Morphine And Related Other (See Comments)    "jerking"  . Sulfa Antibiotics Other (See Comments)    unknown    Patient Measurements: Height: 5\' 2"  (157.5 cm) Weight: 122 lb 12.7 oz (55.7 kg) IBW/kg (Calculated) : 50.1  Vital Signs: Temp: 98.1 F (36.7 C) (12/17 0245) Temp src: Oral (12/17 0245) BP: 144/42 mmHg (12/17 0315) Pulse Rate: 64 (12/17 0315)  Labs (at [facility]): WBC   5.7 Hgb   11.1 Hct    31.9 Plt     136  SCr   1.0  No results found for this basename: HGB, HCT, PLT, APTT, LABPROT, INR, HEPARINUNFRC, CREATININE, CKTOTAL, CKMB, TROPONINI,  in the last 72 hours  Estimated Creatinine Clearance: 39.4 ml/min (by C-G formula based on Cr of 0.9).   Medical History: Past Medical History  Diagnosis Date  . Chronic systolic heart failure 10/11/2012  . Ischemic cardiomyopathy- s/p CABG 10/11/2012    EF 39%  . Complete heart block 10/11/2012  . Pacemaker-Medtronic 10/11/2012  . Stress incontinence     multiple surgeries  . Anginal pain   . Hypertension   . Shortness of breath   . OSA (obstructive sleep apnea) 10/11/2012    Uses CPAP. AHI was .45/hr at 9 cm water pressure. RDI was 0.4/hr. Patient was unable to reach REM sleep and maintain the supine position at optimal pressure. Patient was able to sleep for prolonged periods of time at 9 cm water pressure without suffering further from respiratory events.  [name] GERD (gastroesophageal reflux disease)   . H/O hiatal hernia   . Headache(784.0)   . Anemia   . Hyperlipidemia   . Coronary artery disease   . Weakness 10/06/12    Medications:  ASA  Bystolic  Ntg  Paxil  Crestor  Diovan HCT  Assessment: 79 yo female with chest pain for Lovenox.  Lovenox 55 mg SQ given at midnight  Goal of Therapy:  Monitor  platelets by anticoagulation protocol: Yes   Plan:  Lovenox 55 mg SQ q12h  Eddie Candle 06/07/2013,4:08 AM

## 2013-06-07 NOTE — Progress Notes (Signed)
Subjective: Feeling better. SSCP has improved. It has decreased from 7/10 to 1/10. No other complaints.   Objective: Vital signs in last 24 hours: Temp:  [98.1 F (36.7 C)] 98.1 F (36.7 C) (12/17 0245) Pulse Rate:  [60-64] 64 (12/17 0700) Resp:  [11-17] 14 (12/17 0700) BP: (117-153)/(29-56) 117/29 mmHg (12/17 0700) SpO2:  [99 %-100 %] 100 % (12/17 0700) Weight:  [122 lb 12.7 oz (55.7 kg)] 122 lb 12.7 oz (55.7 kg) (12/17 0245)    Intake/Output from previous day: 12/16 0701 - 12/17 0700 In: 46.4 [I.V.:46.4] Out: 200 [Urine:200] Intake/Output this shift:    Medications Current Facility-Administered Medications  Medication Dose Route Frequency Provider Last Rate Last Dose  . 0.9 %  sodium chloride infusion   Intravenous Continuous Quintella Reichert, MD 20 mL/hr at 06/07/13 0324 20 mL/hr at 06/07/13 0324  . acetaminophen (TYLENOL) tablet 650 mg  650 mg Oral Q4H PRN Quintella Reichert, MD      . aspirin EC tablet 81 mg  81 mg Oral Daily Quintella Reichert, MD      . atorvastatin (LIPITOR) tablet 10 mg  10 mg Oral q1800 Quintella Reichert, MD      . enoxaparin (LOVENOX) injection 55 mg  55 mg Subcutaneous BID Lennette Bihari, MD      . hydrochlorothiazide (HYDRODIURIL) tablet 25 mg  25 mg Oral Daily Lennette Bihari, MD      . irbesartan (AVAPRO) tablet 300 mg  300 mg Oral Daily Lennette Bihari, MD      . nebivolol (BYSTOLIC) tablet 2.5 mg  2.5 mg Oral QHS Quintella Reichert, MD      . nitroGLYCERIN (NITROSTAT) SL tablet 0.4 mg  0.4 mg Sublingual Q5 Min x 3 PRN Quintella Reichert, MD      . nitroGLYCERIN 0.2 mg/mL in dextrose 5 % infusion  3-30 mcg/min Intravenous Titrated Quintella Reichert, MD 9 mL/hr at 06/07/13 0324 30 mcg/min at 06/07/13 0324  . ondansetron (ZOFRAN) injection 4 mg  4 mg Intravenous Q6H PRN Quintella Reichert, MD      . PARoxetine (PAXIL) tablet 30 mg  30 mg Oral Daily Quintella Reichert, MD        PE: General appearance: alert, cooperative and no distress Lungs: clear to auscultation  bilaterally Heart: regular rate and rhythm Extremities: no LEE Pulses: 2+ and symmetric Skin: warm and dry Neurologic: Grossly normal  Lab Results:   Recent Labs  06/07/13 0621  WBC 3.6*  HGB 10.2*  HCT 28.6*  PLT PENDING   BMET  Recent Labs  06/07/13 0621  NA 135  K 3.5  CL 95*  CO2 32  GLUCOSE 102*  BUN 21  CREATININE 1.05  CALCIUM 9.5   PT/INR No results found for this basename: LABPROT, INR,  in the last 72 hours Cholesterol  Recent Labs  06/07/13 0621  CHOL 123   Cardiac Panel (last 3 results)  Recent Labs  06/07/13 0621  TROPONINI <0.30    Assessment/Plan  Principal Problem:   Unstable angina Active Problems:   Pacemaker-Medtronic-CRT   CAD (coronary artery disease)   Hyperlipidemia LDL goal < 70   History of mitral valve repair  Plan: Pain improved but still with 1/10 SSCP. Initial troponin negative. Will continue to cycle x 3. HR and BP both stable. Will continue IV NTG. She received a full dose of SQ Lovenox at 12 MN. Also on ASA, BB, statin and ARB. She will  likely require a LHC today. Will keep NPO until decision is made by MD.     LOS: 0 days    Brittainy M. Delmer Islam 06/07/2013 7:58 AM

## 2013-06-07 NOTE — CV Procedure (Signed)
[name] is a 79 y.o. female    [id number] LOCATION:  FACILITY: [facility]  PHYSICIAN: Nanetta Batty, M.D. 24-Jul-1930   DATE OF PROCEDURE:  06/07/2013  DATE OF DISCHARGE:     CARDIAC CATHETERIZATION     History obtained from chart review.[name] is 79 years old. She has a history of coronary artery disease and remotely had undergone intervention to an intermediate vessel. 2003 due to progressive CAD she underwent CABG surgery with a LIMA to the LAD, vein to the intermediate and at that time also had mitral valve repair with angioplasty ring. Her last cardiac catheterization in December 2013 showed an ejection fraction of 35-40%.  She has significant native CAD with previously noted 80-85% distal left main stenosis, 70% ostial stenosis of her LAD proximal to the previously placed stent, total occlusion of the ramus intermediate vessel at the site of remote stenting. The circumflex was diminutive without stenosis. The RCA was a large dominant vessel with 20% luminal irregularities. She had dyskinetic segment in the distal anterolateral apical region. Initially, the patient had undergone permanent pacemaker insertion in 2007 for symptomatic bradycardia. An echo Doppler study in October 2013 showed reduction in ejection fraction to 25-35% with intraventricular dyssynchrony, moderate pulmonary hypertension with estimated RV systolic pressure 46 mm, moderate TR, and mild to moderate AR. She was referred to Dr. Graciela Husbands and in June 2014 had a biventricular upgrade revision. She did have an echo Doppler study which was done on 02/21/2013. This now shows significantly improved LV function with an ejection fraction of 50-55%. There was grade 2 diastolic dysfunction. Her septal motion showed paradox. There was mild aortic regurgitation. Angioplasty mitral ring was present. There was mild left atrial dilatation. PA pressure was now 38 mm.  She was in her USOH until last night around 8:30pm after eating  dinner. She developed substernal chest pressure across her chest and took 3 SL NTG with minimal relief. She denied any diaphoresis, nausea or SOB. She went to the ER where initial cardiac enzymes were normal and EKG showed NSR with V paced rhythm. Due to her extensive cardiac history she was transferred to [facility].     PROCEDURE DESCRIPTION:   The patient was brought to the second floor [facility] cath lab in the postabsorptive state. She was premedicated with Valium 5 mg by mouth, IV Versed and fentanyl. Her right groinwas prepped and shaved in usual sterile fashion. Xylocaine 1% was used for local anesthesia. A 5 French sheath was inserted into the right common femoral artery using standard Seldinger technique.5 French right and left Judkins diagnostic catheters along with a 5 French pigtail catheter were used for selective coronary angiography, selective vein graft and IMA angiography and left ventriculography. Visipaque dye was used for the entirety of the case. Retrograde aortic, left ventricular end pullback pressures were recorded.  HEMODYNAMICS:    AO SYSTOLIC/AO DIASTOLIC: 141/47   LV SYSTOLIC/LV DIASTOLIC: 142/8  ANGIOGRAPHIC RESULTS:   1. Left main; 90% distal  2. LAD; 90% ostial with moderate in-stent restenosis within the proximal LAD stent. There was competitive flow distally. The first diagonal branch was was a small to medium-size vessel that arose from the proximal third of the LAD and was unprotected 3. Left circumflex; small diminutive and free of significant disease. There was a large ramus branch which had a stent in the proximal portion was occluded and ultimately bypassed.  4. Right coronary artery; dominant with at most 50-60% ostial posterior descending artery stenosis 5.LIMA TO  LAD; widely patent 6. SVG TO ramus branch was widely patent 7. Left ventriculography; RAO left ventriculogram was performed using  25 mL of Visipaque dye at 12 mL/second. The overall LVEF  estimated  60 %  Without wall motion abnormalities  IMPRESSION:[name] has patent grafts a patent dominant RCA with normal LV function. Her anatomy is unchanged compared to her prior cath one year ago. There really is no culprit lesion. Her first diagonal which is unprotected but it is a relatively small vessel and her anatomy is unchanged. Continue medical therapy will be recommended. The sheath was removed and pressure was held on the groin to achieve hemostasis. The patient left the Cath Lab in stable condition  Runell Gess. MD, [facility] 06/07/2013 12:34 PM

## 2013-06-07 NOTE — Care Management Note (Signed)
    Page 1 of 1   06/07/2013     8:29:32 AM   CARE MANAGEMENT NOTE 06/07/2013  Patient:  [name], [name]   Account Number:  [id number]  Date Initiated:  06/07/2013  Documentation initiated by:  Junius Creamer  Subjective/Objective Assessment:   adm w angina     Action/Plan:   lives w husband, pcp d robert robbins   Anticipated DC Date:     Anticipated DC Plan:           Choice offered to / List presented to:             Status of service:   Medicare Important Message given?   (If response is "NO", the following Medicare IM given date fields will be blank) Date Medicare IM given:   Date Additional Medicare IM given:    Discharge Disposition:    Per UR Regulation:  Reviewed for med. necessity/level of care/duration of stay  If discussed at Long Length of Stay Meetings, dates discussed:    Comments:

## 2013-06-07 NOTE — H&P (View-Only) (Signed)
  Subjective: Feeling better. SSCP has improved. It has decreased from 7/10 to 1/10. No other complaints.   Objective: Vital signs in last 24 hours: Temp:  [98.1 F (36.7 C)] 98.1 F (36.7 C) (12/17 0245) Pulse Rate:  [60-64] 64 (12/17 0700) Resp:  [11-17] 14 (12/17 0700) BP: (117-153)/(29-56) 117/29 mmHg (12/17 0700) SpO2:  [99 %-100 %] 100 % (12/17 0700) Weight:  [122 lb 12.7 oz (55.7 kg)] 122 lb 12.7 oz (55.7 kg) (12/17 0245)    Intake/Output from previous day: 12/16 0701 - 12/17 0700 In: 46.4 [I.V.:46.4] Out: 200 [Urine:200] Intake/Output this shift:    Medications Current Facility-Administered Medications  Medication Dose Route Frequency Provider Last Rate Last Dose  . 0.9 %  sodium chloride infusion   Intravenous Continuous Traci R Turner, MD 20 mL/hr at 06/07/13 0324 20 mL/hr at 06/07/13 0324  . acetaminophen (TYLENOL) tablet 650 mg  650 mg Oral Q4H PRN Traci R Turner, MD      . aspirin EC tablet 81 mg  81 mg Oral Daily Traci R Turner, MD      . atorvastatin (LIPITOR) tablet 10 mg  10 mg Oral q1800 Traci R Turner, MD      . enoxaparin (LOVENOX) injection 55 mg  55 mg Subcutaneous BID Thomas A Kelly, MD      . hydrochlorothiazide (HYDRODIURIL) tablet 25 mg  25 mg Oral Daily Thomas A Kelly, MD      . irbesartan (AVAPRO) tablet 300 mg  300 mg Oral Daily Thomas A Kelly, MD      . nebivolol (BYSTOLIC) tablet 2.5 mg  2.5 mg Oral QHS Traci R Turner, MD      . nitroGLYCERIN (NITROSTAT) SL tablet 0.4 mg  0.4 mg Sublingual Q5 Min x 3 PRN Traci R Turner, MD      . nitroGLYCERIN 0.2 mg/mL in dextrose 5 % infusion  3-30 mcg/min Intravenous Titrated Traci R Turner, MD 9 mL/hr at 06/07/13 0324 30 mcg/min at 06/07/13 0324  . ondansetron (ZOFRAN) injection 4 mg  4 mg Intravenous Q6H PRN Traci R Turner, MD      . PARoxetine (PAXIL) tablet 30 mg  30 mg Oral Daily Traci R Turner, MD        PE: General appearance: alert, cooperative and no distress Lungs: clear to auscultation  bilaterally Heart: regular rate and rhythm Extremities: no LEE Pulses: 2+ and symmetric Skin: warm and dry Neurologic: Grossly normal  Lab Results:   Recent Labs  06/07/13 0621  WBC 3.6*  HGB 10.2*  HCT 28.6*  PLT PENDING   BMET  Recent Labs  06/07/13 0621  NA 135  K 3.5  CL 95*  CO2 32  GLUCOSE 102*  BUN 21  CREATININE 1.05  CALCIUM 9.5   PT/INR No results found for this basename: LABPROT, INR,  in the last 72 hours Cholesterol  Recent Labs  06/07/13 0621  CHOL 123   Cardiac Panel (last 3 results)  Recent Labs  06/07/13 0621  TROPONINI <0.30    Assessment/Plan  Principal Problem:   Unstable angina Active Problems:   Pacemaker-Medtronic-CRT   CAD (coronary artery disease)   Hyperlipidemia LDL goal < 70   History of mitral valve repair  Plan: Pain improved but still with 1/10 SSCP. Initial troponin negative. Will continue to cycle x 3. HR and BP both stable. Will continue IV NTG. She received a full dose of SQ Lovenox at 12 MN. Also on ASA, BB, statin and ARB. She will   likely require a LHC today. Will keep NPO until decision is made by MD.     LOS: 0 days    Brittainy M. Simmons, PA-C 06/07/2013 7:58 AM   

## 2013-07-05 ENCOUNTER — Ambulatory Visit: Payer: Medicare Other | Admitting: Cardiovascular Disease

## 2013-07-24 ENCOUNTER — Telehealth: Payer: Self-pay | Admitting: *Deleted

## 2013-07-24 NOTE — Telephone Encounter (Signed)
Pt was given tamiflu and wanted to know if it was ok for her to take it. She wanted Dr. Claiborne Billings to tell her it was ok.

## 2013-07-24 NOTE — Telephone Encounter (Signed)
Spoke with patient - husband has flu and patient was advised to take tamiflu as preventative means. Spoke with Erasmo Downer - OK to take medicine

## 2013-08-10 DIAGNOSIS — J018 Other acute sinusitis: Secondary | ICD-10-CM | POA: Diagnosis not present

## 2013-08-18 ENCOUNTER — Telehealth: Payer: Self-pay | Admitting: Internal Medicine

## 2013-08-18 NOTE — Telephone Encounter (Signed)
New message    Husband calling regarding his wife C/O weakness.  B/p fine

## 2013-08-18 NOTE — Telephone Encounter (Signed)
Spoke with pt, she reports she does not need anything at this time.

## 2013-09-01 DIAGNOSIS — R5381 Other malaise: Secondary | ICD-10-CM | POA: Diagnosis not present

## 2013-09-01 DIAGNOSIS — E559 Vitamin D deficiency, unspecified: Secondary | ICD-10-CM | POA: Diagnosis not present

## 2013-09-01 DIAGNOSIS — R5383 Other fatigue: Secondary | ICD-10-CM | POA: Diagnosis not present

## 2013-09-19 DIAGNOSIS — J018 Other acute sinusitis: Secondary | ICD-10-CM | POA: Diagnosis not present

## 2013-10-04 ENCOUNTER — Other Ambulatory Visit: Payer: Self-pay | Admitting: *Deleted

## 2013-10-04 MED ORDER — VALSARTAN-HYDROCHLOROTHIAZIDE 320-25 MG PO TABS: 1.0000 | ORAL_TABLET | Freq: Every day | ORAL | Status: DC

## 2013-10-04 NOTE — Telephone Encounter (Signed)
Pts. Husband called regarding Mrs.Obeirne's Valsartan refill, she is out of medication as of today and needed more. Rx refill was sent into pharmacy and Mr.Squibb was notified.

## 2013-10-12 ENCOUNTER — Ambulatory Visit (INDEPENDENT_AMBULATORY_CARE_PROVIDER_SITE_OTHER): Payer: Medicare Other | Admitting: Cardiovascular Disease

## 2013-10-12 ENCOUNTER — Encounter: Payer: Self-pay | Admitting: Cardiovascular Disease

## 2013-10-12 VITALS — BP 136/58 | HR 70 | Ht 62.0 in | Wt 125.4 lb

## 2013-10-12 DIAGNOSIS — I5032 Chronic diastolic (congestive) heart failure: Secondary | ICD-10-CM

## 2013-10-12 DIAGNOSIS — Z8669 Personal history of other diseases of the nervous system and sense organs: Secondary | ICD-10-CM

## 2013-10-12 DIAGNOSIS — G4733 Obstructive sleep apnea (adult) (pediatric): Secondary | ICD-10-CM

## 2013-10-12 DIAGNOSIS — I255 Ischemic cardiomyopathy: Secondary | ICD-10-CM

## 2013-10-12 DIAGNOSIS — E785 Hyperlipidemia, unspecified: Secondary | ICD-10-CM

## 2013-10-12 DIAGNOSIS — R5383 Other fatigue: Secondary | ICD-10-CM | POA: Diagnosis not present

## 2013-10-12 DIAGNOSIS — R5381 Other malaise: Secondary | ICD-10-CM | POA: Diagnosis not present

## 2013-10-12 DIAGNOSIS — I2510 Atherosclerotic heart disease of native coronary artery without angina pectoris: Secondary | ICD-10-CM | POA: Diagnosis not present

## 2013-10-12 DIAGNOSIS — I2589 Other forms of chronic ischemic heart disease: Secondary | ICD-10-CM | POA: Diagnosis not present

## 2013-10-12 DIAGNOSIS — I429 Cardiomyopathy, unspecified: Secondary | ICD-10-CM

## 2013-10-12 DIAGNOSIS — I428 Other cardiomyopathies: Secondary | ICD-10-CM | POA: Diagnosis not present

## 2013-10-12 NOTE — Patient Instructions (Signed)
Keep your scheduled appointment with DR.Orting physician has requested that you have an echocardiogram. Echocardiography is a painless test that uses sound waves to create images of your heart. It provides your doctor with information about the size and shape of your heart and how well your heart's chambers and valves are working. This procedure takes approximately one hour. There are no restrictions for this procedure. Please have this before you see Dr.Klein (pref. In 4-6 weeks)  Your physician recommends that you return for lab work.   Your physician recommends that you schedule a follow-up appointment in: 6 MONTHS with DR.KELLY

## 2013-10-15 ENCOUNTER — Encounter: Payer: Self-pay | Admitting: Cardiovascular Disease

## 2013-10-15 DIAGNOSIS — R5383 Other fatigue: Secondary | ICD-10-CM | POA: Insufficient documentation

## 2013-10-15 NOTE — Progress Notes (Signed)
Patient ID: [name], female   DOB: 11/24/1930, 80 y.o.   MRN: [id number]      HPI: [name] is a 80 y.o. female who presents for 4 month cardiology evaluation.  [name]  has a history of coronary artery disease and remotely had undergone intervention to an intermediate vessel. In 2003 she underwent CABG surgery with a LIMA to the LAD, vein to the intermediate and at that time also had mitral valve repair with angioplasty ring. Her last cardiac catheterization in December 2013 showed an ejection fraction of 35-40% with  significant native CAD with previously noted 80-85% distal left main stenosis, 70% ostial stenosis of her LAD proximal to the previously placed stent, total occlusion of the ramus intermediate vessel at the site of remote stenting. The circumflex was diminutive without stenosis. The RCA was a large dominant vessel with 20% luminal irregularities. She had dyskinetic segment in the distal anterolateral apical region. Initially, the patient had undergone permanent pacemaker insertion in 2007 for symptomatic bradycardia. An echo Doppler study in October 2013 showed reduction in ejection fraction to 25-35% with intraventricular dyssynchrony, moderate pulmonary hypertension with estimated RV systolic pressure 46 mm, moderate TR, and mild to moderate AR. She was referred to Dr. Caryl Comes and in June 2014 had a biventricular upgrade revision. An echo Doppler study which was done on 02/21/2013  significantly improved LV function with an ejection fraction of 50-55%. There was grade 2 diastolic dysfunction. Her septal motion showed paradox. There was mild aortic regurgitation. Angioplasty mitral ring was present. There was mild left atrial dilatation. PA pressure was now 38 mm.  [name] feels well from a cardiac standpoint. She has frequent migraine headaches.  She has obstructive sleep apnea and admits to using her CPAP therapy.  She complains of severe fatigue.  There is exertional shortness  of breath. At times she gets paresthesias of her arms and legs.  If she is able to be active one day, she is completely wiped out for several days thereafter.  She tells me she will be seeing Dr. Caryl Comes in followup of her by the ICD upgrade in June.   Past Medical History  Diagnosis Date  . Chronic systolic heart failure 07/16/5807  . Ischemic cardiomyopathy- s/p CABG 10/11/2012    EF 39%  . Complete heart block 10/11/2012  . Pacemaker-Medtronic 10/11/2012  . Stress incontinence     multiple surgeries  . Anginal pain   . Hypertension   . Shortness of breath   . OSA (obstructive sleep apnea) 10/11/2012    Uses CPAP. AHI was .45/hr at 9 cm water pressure. RDI was 0.4/hr. Patient was unable to reach REM sleep and maintain the supine position at optimal pressure. Patient was able to sleep for prolonged periods of time at 9 cm water pressure without suffering further from respiratory events.  [name] GERD (gastroesophageal reflux disease)   . H/O hiatal hernia   . Headache(784.0)   . Anemia   . Hyperlipidemia   . Coronary artery disease   . Weakness 10/06/12    Past Surgical History  Procedure Laterality Date  . Pacemaker revision  11/21/2012    BIV   UPGRADE    WITH DR Caryl Comes  . Appendectomy    . Abdominal hysterectomy    . Bladder tack    . Cholecystectomy    . Coronary artery bypass graft  2003    With LIMA-LAD and vein graft to OM, and associated mitral valve repair with anuloplasty  ring and quadrangular resection of the posterior leaflet, 24 mm Seguin ring by Dr. Roxy Manns.  . Coronary angioplasty with stent placement  2012    No significant CAD, patent but ectatic graft supplying the ramus, patent LIMA-LAD, and an EF of 50% at that time. She had an occluded stent to her circumflex from remote intervention. Required recatheterization 05/2012 by Dr. Claiborne Billings and had a patent LIMA-LAD with distal diffuse disease, patent SVG-OM, 10%-20% mid right coronary but an EF of about 35%, quantitatively it was  38.9.  [name] Cardiac catheterization  05/2012    (1st cath 2012) This required recatheterization by Dr. Claiborne Billings and had a patent LIMA-LAD with distal diffuse disease, patent SVG-OM, 10% to 20% mid right coronary but an EF of about 35%, quantitatively iw was 38.9.  . Insert / replace / remove pacemaker  2007    Inserted 2007  . US echocardiography  04/13/12    EF - 25% to 35% with intraventricular dyssynchrony, RVSP of 46, moderate TR, and mild to moderate AI. She had a mild increased mitral valve gradient of 5 mmHg with minimal MR.   [name] Lexiscan myocardial stress test  08/20/11    The EF = 34%. This is a low risk scan. There is a new fix abnormality in the distal LAD artery territory. Although no reversibility is seen, high grade obstruction with resting ischemia cannot be excluded.  . Cardiac valve replacement      Allergies  Allergen Reactions  . Dilaudid [Hydromorphone Hcl] Other (See Comments)    "jerking"  . Morphine And Related Other (See Comments)    "jerking"  . Sulfa Antibiotics Other (See Comments)    unknown    Current Outpatient Prescriptions  Medication Sig Dispense Refill  . alendronate (FOSAMAX) 70 MG tablet Take 1 tablet by mouth once a week.      [name] aspirin EC 81 MG tablet Take 81 mg by mouth daily.      . Butalbital-APAP-Caffeine 50-300-40 MG CAPS Take 1 capsule by mouth as needed.      . Calcium Carbonate-Vitamin D (CALCIUM 600/VITAMIN D) 600-400 MG-UNIT per tablet Take 1 tablet by mouth daily.      . hydroxypropyl methylcellulose (ISOPTO TEARS) 2.5 % ophthalmic solution Place 2 drops into both eyes daily.      . nebivolol (BYSTOLIC) 5 MG tablet Take 2.5 mg by mouth at bedtime.      . nitroGLYCERIN (NITROSTAT) 0.4 MG SL tablet Place 1 tablet (0.4 mg total) under the tongue every 5 (five) minutes x 3 doses as needed for chest pain.  25 tablet  2  . PARoxetine (PAXIL) 30 MG tablet Take 30 mg by mouth every evening.      . rosuvastatin (CRESTOR) 10 MG tablet Take 5 mg by mouth  every other day.      . valsartan-hydrochlorothiazide (DIOVAN-HCT) 320-25 MG per tablet Take 1 tablet by mouth daily.  90 tablet  3   No current facility-administered medications for this visit.    History   Social History  . Marital Status: Married    Spouse Name: N/A    Number of Children: N/A  . Years of Education: N/A   Occupational History  . Not on file.   Social History Main Topics  . Smoking status: Never Smoker   . Smokeless tobacco: Never Used  . Alcohol Use: No  . Drug Use: No  . Sexual Activity: Not on file   Other Topics Concern  . Not on file  Social History Narrative  . No narrative on file   socially she is married has 3 children and 9 grandchildren.  Family History  Problem Relation Age of Onset  . Cancer Father     ROS is negative for fevers, chills or night sweats.  She denies skin changes. She denies hearing issues. Her shortness of breath has improved , but she still admits to experiencing this with activity. She denies cough or sputum production. She does have chronic headaches and was told that these are migraine. She denies palpitations. He denies presyncope or syncope. She denies angina. She denies nausea or vomiting. She has had recurrent problems with her bladder. She denies claudication. There is no edema.  She admits to occasional right arm and leg paresthesias. There is no diabetes. She denies cold or heat intolerance. She does have sleep apnea which is complex.  She is unaware of breakthrough snoring.  She denies restless legs.  Other comprehensive 14 point system review is negative.  PE BP 136/58  Pulse 70  Ht 5\' 2"  (1.575 m)  Wt 125 lb 6.4 oz (56.881 kg)  BMI 22.93 kg/m2  Repeat blood pressure 136/64 supine 136/60 standing General: Alert, oriented, no distress.  Skin: normal turgor, no rashes; warm and dry HEENT: Normocephalic, atraumatic. Pupils round and reactive; sclera anicteric;no lid lag.  Nose without nasal septal  hypertrophy Mouth/Parynx benign; Mallinpatti scale 3 Neck: No JVD, no carotid bruits with normal carotid upstroke Lungs: clear to ausculatation and percussion; no wheezing or rales Heart: RRR, s1 s2 normal 1- 2/6 systolic murmur; no diastolic murmur, rubs, thrills or heaves. Abdomen: soft, nontender; no hepatosplenomehaly, BS+; abdominal aorta nontender and not dilated by palpation. Pulses 2+ Extremities: no clubbing cyanosis or edema, Homan's sign negative  Neurologic: grossly nonfocal Psychologic: normal affect and mood.  ECG (independently read by me): Appropriate atrial sensing and pacing with 100% ventricular pacing  LABS:  BMET    Component Value Date/Time   NA 135 06/07/2013 0621   K 3.5 06/07/2013 0621   CL 95* 06/07/2013 0621   CO2 32 06/07/2013 0621   GLUCOSE 102* 06/07/2013 0621   BUN 21 06/07/2013 0621   CREATININE 1.05 06/07/2013 0621   CALCIUM 9.5 06/07/2013 0621   GFRNONAA 49* 06/07/2013 0621   GFRAA 57* 06/07/2013 0621     Hepatic Function Panel     Component Value Date/Time   PROT 6.0 06/07/2013 0621   ALBUMIN 3.5 06/07/2013 0621   AST 17 06/07/2013 0621   ALT 11 06/07/2013 0621   ALKPHOS 58 06/07/2013 0621   BILITOT 0.2* 06/07/2013 0621   BILIDIR 0.2 10/10/2010 1023   IBILI 0.8 10/10/2010 1023     CBC    Component Value Date/Time   WBC 3.6* 06/07/2013 0621   RBC 3.36* 06/07/2013 0621   HGB 10.2* 06/07/2013 0621   HCT 28.6* 06/07/2013 0621   PLT 111* 06/07/2013 0621   MCV 85.1 06/07/2013 0621   MCH 30.4 06/07/2013 0621   MCHC 35.7 06/07/2013 0621   RDW 13.4 06/07/2013 0621   LYMPHSABS 1.0 06/07/2013 0621   MONOABS 0.3 06/07/2013 0621   EOSABS 0.1 06/07/2013 0621   BASOSABS 0.0 06/07/2013 0621     BNP No results found for this basename: probnp    Lipid Panel     Component Value Date/Time   CHOL 123 06/07/2013 0621     RADIOLOGY: No results found.    ASSESSMENT AND PLAN:  Ms. Luise Yamamoto is 12 years status post CABG  revascularization surgery with a LIMA to her LAD and vein graft to obtuse marginal vessel. She also status post mitral valve repair with annuloplasty ring and quadrangular resection of posterior leaflet which was done by Dr. Roxy Manns.  Her blood pressure today is stable without orthostatic change on a medical regimen consisting of diastolic 2.5 mg at bedtime, Diovan HCT 320/25.  She admits to using her CPAP 100% of the time.  She's not having any anginal symptoms.  Her shortness of breath has improved, but her fatigue has become more problematic.  She is washed out with minimal activity.  She will be seeing Dr. Caryl Comes in June for followup evaluation with reference to her by the pacing upgrade.  I am suggesting that prior to that office visit we obtain another echo Doppler study to make certain her LV function continues to be improved.  I am also recommending a comprehensive set of laboratory consisting of a CBC, B12, folate, vitamin D level, thyroid function studies, CMet, as well as iron studies.  She will continue to use her CPAP therapy.  I will try to obtain a download of her current CPAP unit to make certain she is being optimally treated with reference to her significant obstructive sleep apnea.  I will see her in 6 months for cardiology reevaluation.     Troy Sine, MD, [facility]  10/15/2013 10:07 AM

## 2013-10-17 ENCOUNTER — Ambulatory Visit (HOSPITAL_COMMUNITY): Payer: Medicare Other

## 2013-10-18 DIAGNOSIS — R51 Headache: Secondary | ICD-10-CM | POA: Diagnosis not present

## 2013-10-20 DIAGNOSIS — I2589 Other forms of chronic ischemic heart disease: Secondary | ICD-10-CM | POA: Diagnosis not present

## 2013-10-20 DIAGNOSIS — I428 Other cardiomyopathies: Secondary | ICD-10-CM | POA: Diagnosis not present

## 2013-10-20 DIAGNOSIS — R5383 Other fatigue: Secondary | ICD-10-CM | POA: Diagnosis not present

## 2013-10-20 DIAGNOSIS — I2510 Atherosclerotic heart disease of native coronary artery without angina pectoris: Secondary | ICD-10-CM | POA: Diagnosis not present

## 2013-10-20 DIAGNOSIS — R5381 Other malaise: Secondary | ICD-10-CM | POA: Diagnosis not present

## 2013-10-24 ENCOUNTER — Ambulatory Visit (HOSPITAL_COMMUNITY): Payer: Medicare Other

## 2013-10-26 ENCOUNTER — Ambulatory Visit (HOSPITAL_COMMUNITY)
Admission: RE | Admit: 2013-10-26 | Discharge: 2013-10-26 | Disposition: A | Discharge status: Home / Self Care | Payer: Medicare Other | Source: Ambulatory Visit | Attending: Cardiovascular Disease | Admitting: Cardiovascular Disease

## 2013-10-26 ENCOUNTER — Ambulatory Visit (HOSPITAL_COMMUNITY): Payer: Medicare Other

## 2013-10-26 DIAGNOSIS — I429 Cardiomyopathy, unspecified: Secondary | ICD-10-CM

## 2013-10-26 DIAGNOSIS — I428 Other cardiomyopathies: Secondary | ICD-10-CM | POA: Insufficient documentation

## 2013-10-26 DIAGNOSIS — R5383 Other fatigue: Secondary | ICD-10-CM

## 2013-10-26 DIAGNOSIS — R5381 Other malaise: Secondary | ICD-10-CM

## 2013-10-26 DIAGNOSIS — I2510 Atherosclerotic heart disease of native coronary artery without angina pectoris: Secondary | ICD-10-CM

## 2013-10-26 DIAGNOSIS — I359 Nonrheumatic aortic valve disorder, unspecified: Secondary | ICD-10-CM

## 2013-10-26 DIAGNOSIS — I255 Ischemic cardiomyopathy: Secondary | ICD-10-CM

## 2013-10-26 NOTE — Progress Notes (Signed)
2D Echocardiogram Complete.  10/26/2013   [name], RDCS 

## 2013-11-02 ENCOUNTER — Telehealth: Payer: Self-pay | Admitting: Cardiovascular Disease

## 2013-11-02 NOTE — Telephone Encounter (Signed)
Pt would like her lab results from 10-20-13 and her echo results from 10-26-13.

## 2013-11-02 NOTE — Telephone Encounter (Signed)
Results sent to [facility]; nl EF, mild LVH, Mild AR. LV function is very good.

## 2013-11-02 NOTE — Telephone Encounter (Signed)
Message to Dr. Claiborne Billings to review Echo and send interp to [facility].

## 2013-11-03 NOTE — Telephone Encounter (Signed)
Husband called and said [facility] was faxing her lab results now.

## 2013-11-03 NOTE — Telephone Encounter (Signed)
Returned a call to patient's husband informing him of patient's echo results.

## 2013-11-03 NOTE — Telephone Encounter (Signed)
Results sent to [facility] to call patient.

## 2013-11-03 NOTE — Telephone Encounter (Signed)
Labs were received and placed into box to be scanned into epic for Dr. Claiborne Billings to review.

## 2013-11-03 NOTE — Telephone Encounter (Signed)
Message copied by Lauralee Evener on Fri Nov 03, 2013  5:07 PM ------      Message from: Shelva Majestic A      Created: Thu Nov 02, 2013  5:33 PM       EF 55-60%; LVH; mild AR ------

## 2013-11-06 ENCOUNTER — Telehealth: Payer: Self-pay | Admitting: *Deleted

## 2013-11-06 NOTE — Telephone Encounter (Signed)
Called patient's husband to give lab results. No answer. No machine. I will try again tomorrow.

## 2013-11-23 ENCOUNTER — Ambulatory Visit: Payer: Medicare Other | Admitting: Cardiovascular Disease

## 2013-11-28 ENCOUNTER — Encounter: Payer: Medicare Other | Admitting: Internal Medicine

## 2013-12-12 ENCOUNTER — Encounter: Payer: Self-pay | Admitting: Cardiovascular Disease

## 2014-01-03 ENCOUNTER — Ambulatory Visit (INDEPENDENT_AMBULATORY_CARE_PROVIDER_SITE_OTHER): Payer: Medicare Other | Admitting: Internal Medicine

## 2014-01-03 ENCOUNTER — Encounter: Payer: Self-pay | Admitting: Internal Medicine

## 2014-01-03 VITALS — BP 136/72 | HR 84 | Ht 62.0 in | Wt 128.0 lb

## 2014-01-03 DIAGNOSIS — Z950 Presence of cardiac pacemaker: Secondary | ICD-10-CM | POA: Diagnosis not present

## 2014-01-03 DIAGNOSIS — I255 Ischemic cardiomyopathy: Secondary | ICD-10-CM

## 2014-01-03 DIAGNOSIS — I2589 Other forms of chronic ischemic heart disease: Secondary | ICD-10-CM | POA: Diagnosis not present

## 2014-01-03 DIAGNOSIS — I442 Atrioventricular block, complete: Secondary | ICD-10-CM | POA: Diagnosis not present

## 2014-01-03 DIAGNOSIS — I5032 Chronic diastolic (congestive) heart failure: Secondary | ICD-10-CM | POA: Diagnosis not present

## 2014-01-03 DIAGNOSIS — I2510 Atherosclerotic heart disease of native coronary artery without angina pectoris: Secondary | ICD-10-CM

## 2014-01-03 LAB — MDC_IDC_ENUM_SESS_TYPE_INCLINIC
Battery Remaining Longevity: 58.8 mo
Battery Voltage: 2.98 V
Brady Statistic RA Percent Paced: 39 %
Lead Channel Impedance Value: 625 Ohm
Lead Channel Pacing Threshold Amplitude: 0.5 V
Lead Channel Pacing Threshold Amplitude: 0.5 V
Lead Channel Pacing Threshold Amplitude: 2.125 V
Lead Channel Pacing Threshold Pulse Width: 0.4 ms
Lead Channel Pacing Threshold Pulse Width: 0.5 ms
Lead Channel Pacing Threshold Pulse Width: 0.5 ms
Lead Channel Sensing Intrinsic Amplitude: 12 mV
Lead Channel Setting Pacing Amplitude: 2 V
Lead Channel Setting Pacing Amplitude: 2.5 V
Lead Channel Setting Pacing Pulse Width: 0.5 ms
Lead Channel Setting Pacing Pulse Width: 1 ms
Lead Channel Setting Sensing Sensitivity: 5 mV
MDC IDC MSMT LEADCHNL LV IMPEDANCE VALUE: 862.5 Ohm
MDC IDC MSMT LEADCHNL LV PACING THRESHOLD PULSEWIDTH: 1 ms
MDC IDC MSMT LEADCHNL RA IMPEDANCE VALUE: 475 Ohm
MDC IDC MSMT LEADCHNL RA PACING THRESHOLD AMPLITUDE: 0.5 V
MDC IDC MSMT LEADCHNL RA PACING THRESHOLD PULSEWIDTH: 0.4 ms
MDC IDC MSMT LEADCHNL RA SENSING INTR AMPL: 2.4 mV
MDC IDC MSMT LEADCHNL RV PACING THRESHOLD AMPLITUDE: 0.5 V
MDC IDC PG MODEL: 3242
MDC IDC PG SERIAL: 2960456
MDC IDC SESS DTM: 20150715171916
MDC IDC SET LEADCHNL LV PACING AMPLITUDE: 3.125
MDC IDC STAT BRADY RV PERCENT PACED: 99.93 %

## 2014-01-03 NOTE — Progress Notes (Signed)
Patient Care Team: Myrlene Broker, MD as PCP - General (Family Medicine)   HPI  [name] is a 80 y.o. female Seen in followup for CRT-P. Upgrade accomplished 6/14.  Device was originally implanted for sick sinus syndrome  there is no appreciable change in functional status  She has hx of coronary artery disease with bypass graft in 2003. She had undergone prior stenting. She also  Had mitral valve repair at that time. Because of recurring chest pain in 2012 she underwent catheterization demonstrated a patent LIMA and patent vein graft to the ramus. A large and patent right CA   She has  a catheterization 2013-December demonstrating 80-85% left main and 70% ostial LAD at the point of an old stent in the ramus occlusion and a large dominant right. Ejection fraction was 35-40% repeat catheterization 05/2013 demonstrated normalization of systolic function  She struggled with headaches. She has had migraine headaches now for the last 9 months and has tried all kinds of things including most recently acupuncture.  She's had no palpitations     Past Medical History  Diagnosis Date  . Chronic systolic heart failure 9/67/8938  . Ischemic cardiomyopathy- s/p CABG 10/11/2012    EF 39%  . Complete heart block 10/11/2012  . Pacemaker-Medtronic 10/11/2012  . Stress incontinence     multiple surgeries  . Anginal pain   . Hypertension   . Shortness of breath   . OSA (obstructive sleep apnea) 10/11/2012    Uses CPAP. AHI was .45/hr at 9 cm water pressure. RDI was 0.4/hr. Patient was unable to reach REM sleep and maintain the supine position at optimal pressure. Patient was able to sleep for prolonged periods of time at 9 cm water pressure without suffering further from respiratory events.  [name] GERD (gastroesophageal reflux disease)   . H/O hiatal hernia   . Headache(784.0)   . Anemia   . Hyperlipidemia   . Coronary artery disease   . Weakness 10/06/12    Past Surgical History    Procedure Laterality Date  . Pacemaker revision  11/21/2012    BIV   UPGRADE    WITH DR Caryl Comes  . Appendectomy    . Abdominal hysterectomy    . Bladder tack    . Cholecystectomy    . Coronary artery bypass graft  2003    With LIMA-LAD and vein graft to OM, and associated mitral valve repair with anuloplasty ring and quadrangular resection of the posterior leaflet, 24 mm Seguin ring by Dr. Roxy Manns.  . Coronary angioplasty with stent placement  2012    No significant CAD, patent but ectatic graft supplying the ramus, patent LIMA-LAD, and an EF of 50% at that time. She had an occluded stent to her circumflex from remote intervention. Required recatheterization 05/2012 by Dr. Claiborne Billings and had a patent LIMA-LAD with distal diffuse disease, patent SVG-OM, 10%-20% mid right coronary but an EF of about 35%, quantitatively it was 38.9.  [name] Cardiac catheterization  05/2012    (1st cath 2012) This required recatheterization by Dr. Claiborne Billings and had a patent LIMA-LAD with distal diffuse disease, patent SVG-OM, 10% to 20% mid right coronary but an EF of about 35%, quantitatively iw was 38.9.  . Insert / replace / remove pacemaker  2007    Inserted 2007  . US echocardiography  04/13/12    EF - 25% to 35% with intraventricular dyssynchrony, RVSP of 46, moderate TR, and mild to moderate AI. She had  a mild increased mitral valve gradient of 5 mmHg with minimal MR.   [name] Lexiscan myocardial stress test  08/20/11    The EF = 34%. This is a low risk scan. There is a new fix abnormality in the distal LAD artery territory. Although no reversibility is seen, high grade obstruction with resting ischemia cannot be excluded.  . Cardiac valve replacement      Current Outpatient Prescriptions  Medication Sig Dispense Refill  . aspirin EC 81 MG tablet Take 81 mg by mouth daily.      . Butalbital-APAP-Caffeine 50-300-40 MG CAPS Take 1 capsule by mouth as needed.      . Calcium Carbonate-Vitamin D (CALCIUM 600/VITAMIN D) 600-400  MG-UNIT per tablet Take 1 tablet by mouth daily.      . hydroxypropyl methylcellulose (ISOPTO TEARS) 2.5 % ophthalmic solution Place 2 drops into both eyes daily.      . nebivolol (BYSTOLIC) 5 MG tablet Take 2.5 mg by mouth at bedtime.      . nitroGLYCERIN (NITROSTAT) 0.4 MG SL tablet Place 1 tablet (0.4 mg total) under the tongue every 5 (five) minutes x 3 doses as needed for chest pain.  25 tablet  2  . OVER THE COUNTER MEDICATION Take 1 tablet by mouth as needed (Curamin for migrains).      [name] PARoxetine (PAXIL) 30 MG tablet Take 30 mg by mouth every evening.      . rosuvastatin (CRESTOR) 10 MG tablet Take 5 mg by mouth every other day.      . valsartan-hydrochlorothiazide (DIOVAN-HCT) 320-25 MG per tablet Take 1 tablet by mouth daily.  90 tablet  3   No current facility-administered medications for this visit.    Allergies  Allergen Reactions  . Dilaudid [Hydromorphone Hcl] Other (See Comments)    "jerking"  . Morphine And Related Other (See Comments)    "jerking"  . Sulfa Antibiotics Other (See Comments)    unknown    Review of Systems negative except from HPI and PMH  Physical Exam BP 136/72  Pulse 84  Ht 5\' 2"  (1.575 m)  Wt 128 lb (58.06 kg)  BMI 23.41 kg/m2 Well developed and well nourished in no acute distress HENT normal E scleral and icterus clear Neck Supple JVP flat; carotids brisk and full Clear to ausculation   Regular   frate and rhythm, no murmurs gallops or rub Soft with active bowel sounds No clubbing cyanosis  Edema Alert and oriented, grossly normal motor and sensory function Skin Warm and Dry    Assessment and  Plan  Coronary disease with prior bypass surgery  Atrial fibrillation detected on her device  Sick sinus syndrome  Pacemaker-[employer] The patient's device was interrogated.  The information was reviewed. No changes were made in the programming.    Headaches-chronic  Weakness/fatigue  The patient has atrial fibrillation identified  on her monitors. It was without symptoms. She has a CHADS-VASc score of 4. We have discussed the role of anticoagulation compared aspirin. We wait to hear from her as to how cost informs her decision.   We will also have her undertake a serial exclusion trial for her statin and blocker to see if these contributing to her weakness and fatigue in a way that we can identify and improve   Heart rate excursion is adequate. We would anticipate discontinuing aspirin at the time of anticoagulation with a NOAC

## 2014-01-03 NOTE — Patient Instructions (Addendum)
Your physician recommends that you continue on your current medications as directed. Please refer to the Current Medication list given to you today.  Hold Bystolic for 1 month to see if improvement in symptoms.  If no improvement, then restart medication and,  Hold Crestor for 1 months to see if improvement in symptoms.  Please call us with a decision on a NOAC.  Remote monitoring is used to monitor your Pacemaker of ICD from home. This monitoring reduces the number of office visits required to check your device to one time per year. It allows [place] to keep an eye on the functioning of your device to ensure it is working properly. You are scheduled for a device check from home on 04/04/14. You may send your transmission at any time that day. If you have a wireless device, the transmission will be sent automatically. After your physician reviews your transmission, you will receive a postcard with your next transmission date.  Your physician wants you to follow-up in: 1 year with Dr. Caryl Comes.  You will receive a reminder letter in the mail two months in advance. If you don't receive a letter, please call our office to schedule the follow-up appointment.

## 2014-01-05 ENCOUNTER — Telehealth: Payer: Self-pay | Admitting: Internal Medicine

## 2014-01-05 ENCOUNTER — Other Ambulatory Visit: Payer: Self-pay | Admitting: *Deleted

## 2014-01-05 MED ORDER — APIXABAN 2.5 MG PO TABS: 2.5000 mg | ORAL_TABLET | Freq: Two times a day (BID) | ORAL | Status: DC

## 2014-01-05 NOTE — Telephone Encounter (Signed)
Pt has decided on Eliquis  Informed pt that I would send rx in by the end of the day to [facility]. Patient verbalized understanding and agreeable to plan.

## 2014-01-05 NOTE — Telephone Encounter (Signed)
New message     Talk to [name] to follow up from wed

## 2014-01-09 IMAGING — CT CT CERVICAL SPINE W/O CM
3 of 4 series · 15 of 29 positions shown, 17 images · non-contrast
Comparison: Neck CTA 01/22/2010.

CLINICAL DATA: 82-year-old female with posterior neck and bilateral
shoulder pain.  Headaches.

CT CERVICAL SPINE WITHOUT CONTRAST
TECHNIQUE: Multidetector CT imaging of the cervical spine was
performed. Multiplanar CT image reconstructions were also
generated.

[Series 2: c spine bone · axial · 0.27mm/px · z∈[+82,+197]mm · 5 of 70 slices shown, 7 images]
[im 12/70  soft-tissue]
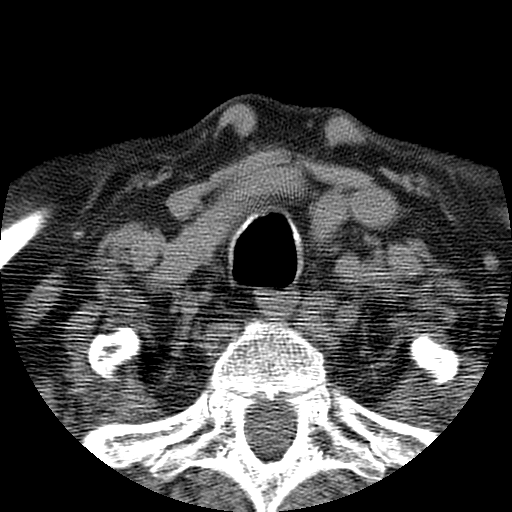
[im 12/70  bone]
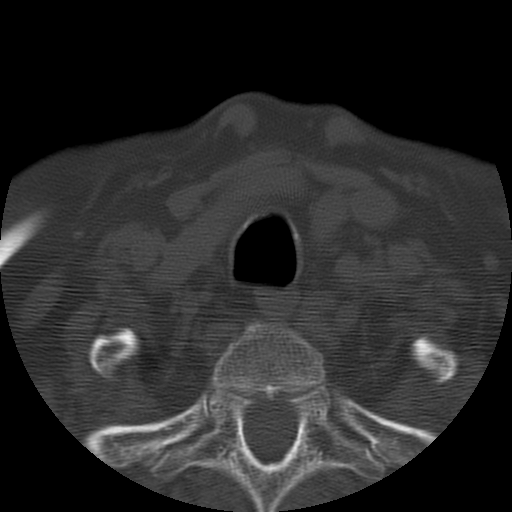
[im 24/70  bone]
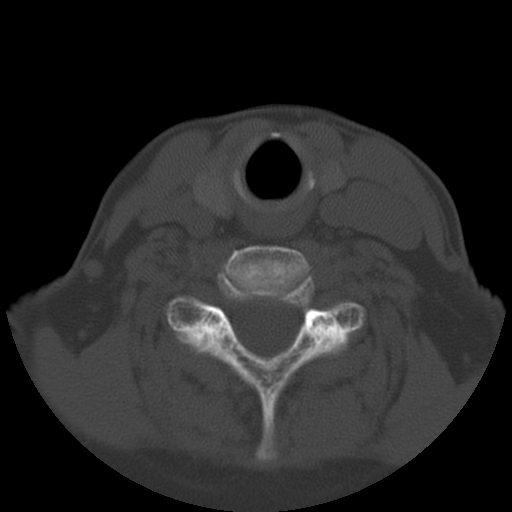
[im 35/70  bone]
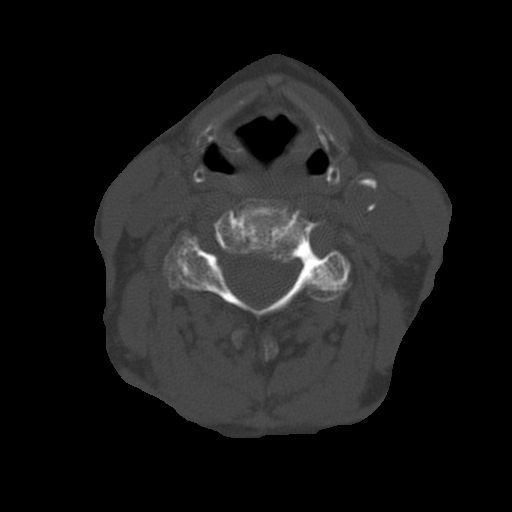
[im 47/70  bone]
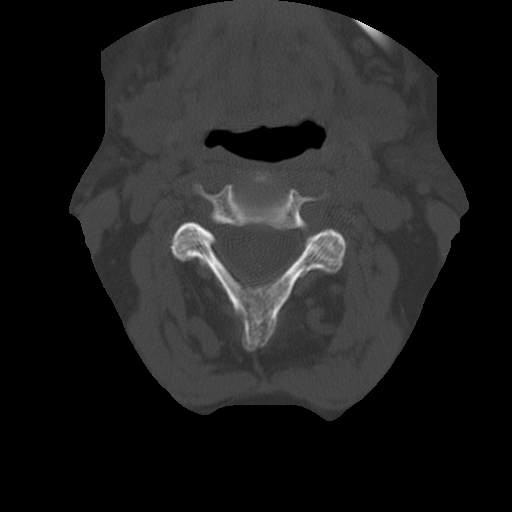
[im 58/70  soft-tissue]
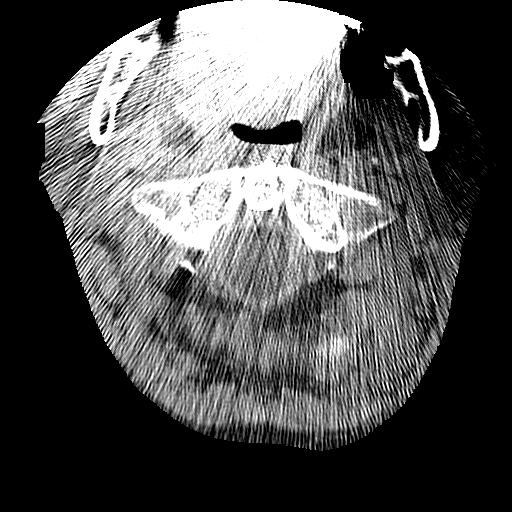
[im 58/70  bone]
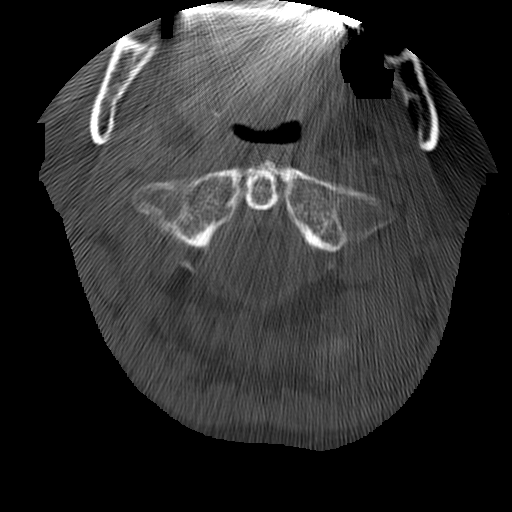

[Series 3: c spine soft · axial · 0.27mm/px · z∈[+82,+197]mm · 5 of 70 slices shown]
[im 12/70  soft-tissue]
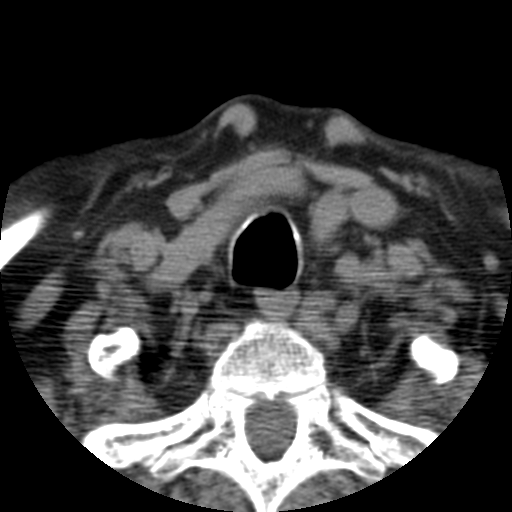
[im 24/70  soft-tissue]
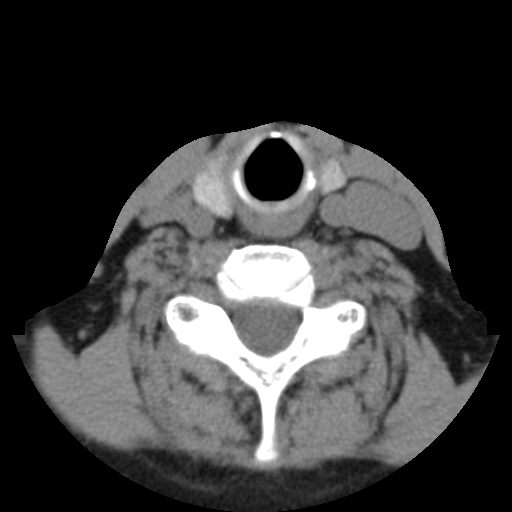
[im 35/70  soft-tissue]
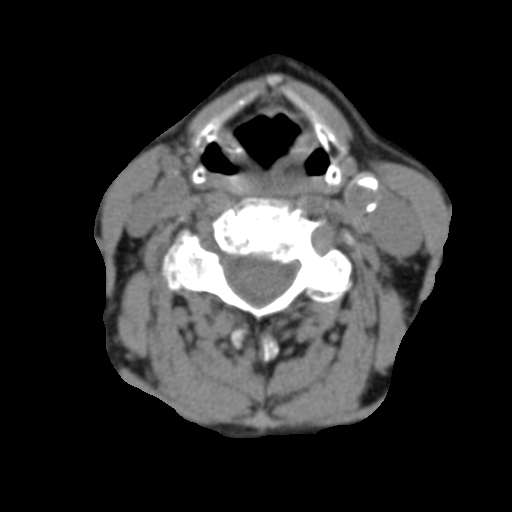
[im 47/70  soft-tissue]
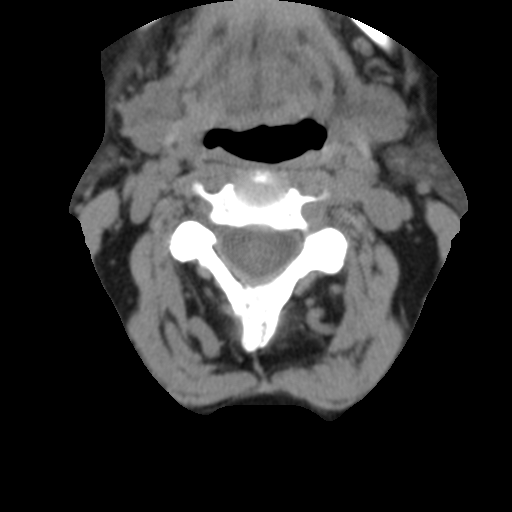
[im 58/70  soft-tissue]
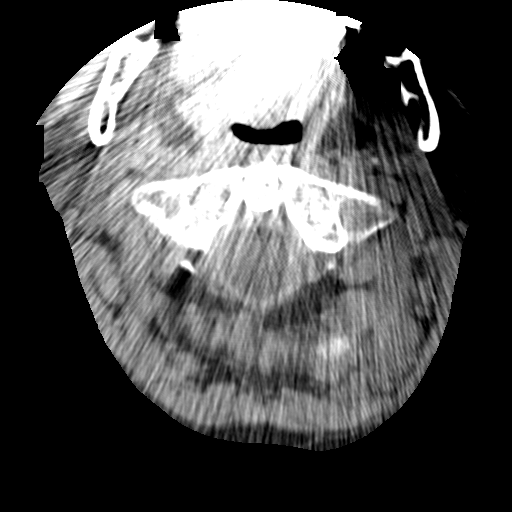

[Series 401: sagittal · sagittal · 0.35mm/px · 5 of 39 slices shown]
[im 7/39  bone]
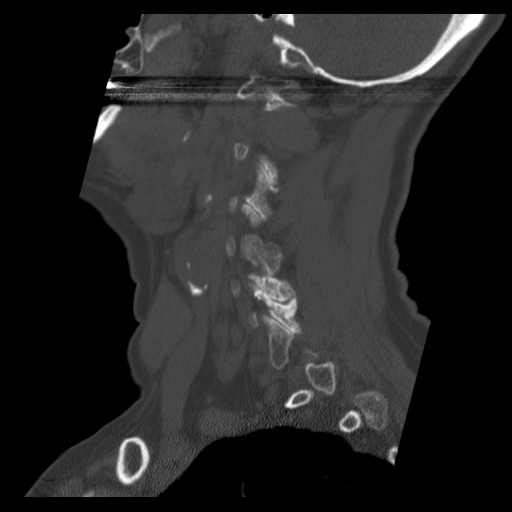
[im 13/39  bone]
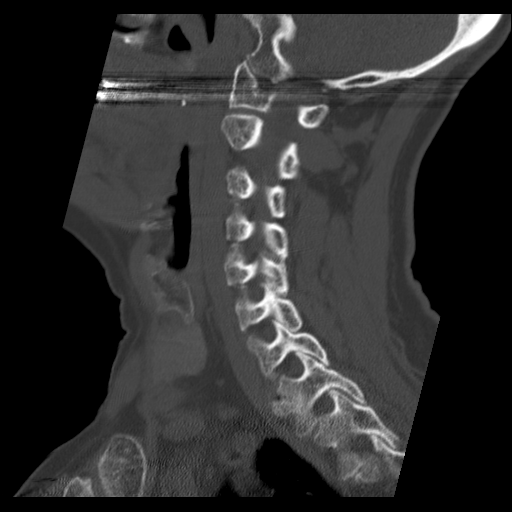
[im 20/39  bone]
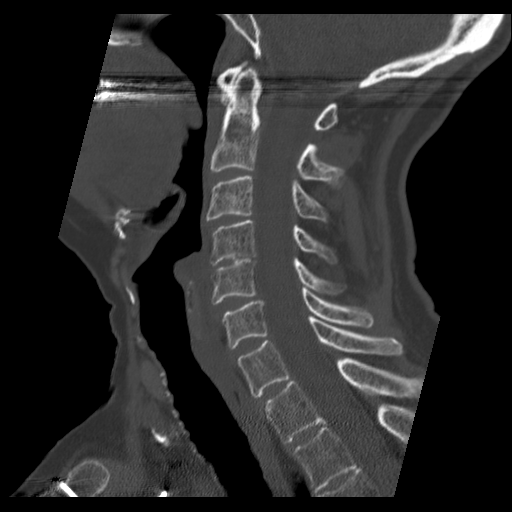
[im 26/39  bone]
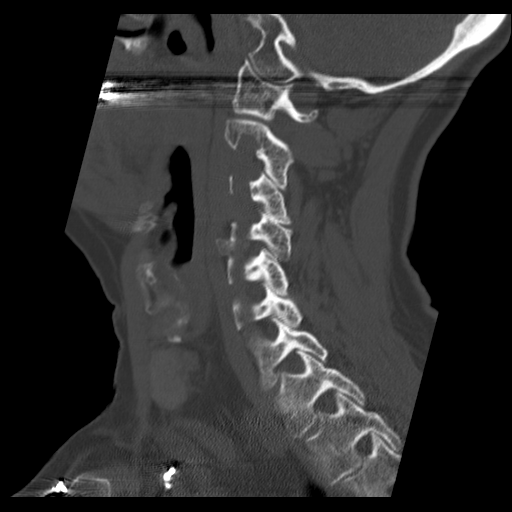
[im 32/39  bone]
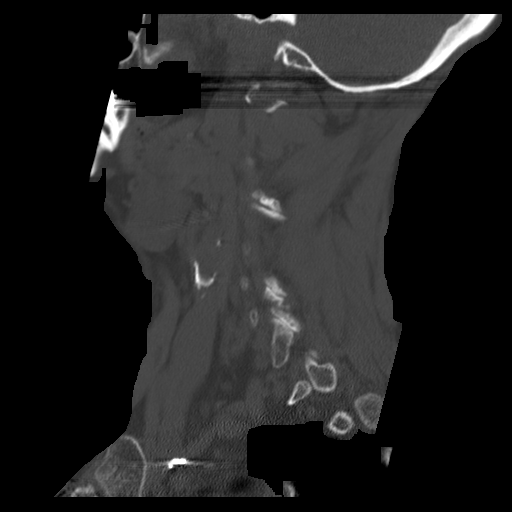

[15 of 29 positions shown; findings below may reference images not displayed]

FINDINGS: Left chest cardiac pacemaker and sequelae of median
sternotomy.  Negative visualized posterior fossa noncontrast brain
parenchyma. Calcified atherosclerosis at the skull base.  Distal
left vertebral artery appears to be dominant.  Cervical carotid
calcified atherosclerosis in the neck.  Other Visualized paraspinal
soft tissues are within normal limits.  Negative lung apices.

Visualized skull base is intact.  No atlanto-occipital
dissociation.  Preserved cervical lordosis. Cervicothoracic
junction alignment is within normal limits.  Bilateral posterior
element alignment is within normal limits.

C2-C3:  Negative.

C3-C4:  Moderate left and mild right facet hypertrophy.  Mild
circumferential disc bulge.  No significant stenosis.

C4-C5:  Right facet joint ankylosis and hypertrophy.  Bilateral
uncovertebral level interbody ankylosis.  No spinal stenosis.  Mild
to moderate bilateral C5 foraminal stenosis.

C5-C6:  Mild anterolisthesis.  Moderate to severe facet hypertrophy
greater on the right.  Circumferential disc bulge.  Ligament flavum
hypertrophy.  No definite spinal stenosis.  Mild to moderate
bilateral C6 foraminal stenosis.

C6-C7:  Mild circumferential disc osteophyte complex.  Mild facet
hypertrophy.  Mild left C7 foraminal stenosis.

C7-T1:  Negative. Negative visualized upper thoracic levels.
IMPRESSION: 1.  No definite cervical spinal stenosis.  Chronic right posterior
element and some interbody ankylosis at C4-C5.
2.  Facet and uncovertebral hypertrophy at that level and
elsewhere.  Subsequent up to moderate neural foraminal stenosis at
the bilateral C5 and C6 nerve levels.

## 2014-01-25 ENCOUNTER — Telehealth: Payer: Self-pay | Admitting: Cardiovascular Disease

## 2014-01-25 DIAGNOSIS — R51 Headache: Secondary | ICD-10-CM | POA: Diagnosis not present

## 2014-01-25 NOTE — Telephone Encounter (Signed)
Closed encounter °

## 2014-01-30 ENCOUNTER — Other Ambulatory Visit: Payer: Self-pay | Admitting: Internal Medicine

## 2014-02-01 ENCOUNTER — Other Ambulatory Visit: Payer: Self-pay | Admitting: Internal Medicine

## 2014-02-02 DIAGNOSIS — R51 Headache: Secondary | ICD-10-CM | POA: Diagnosis not present

## 2014-02-06 DIAGNOSIS — R51 Headache: Secondary | ICD-10-CM | POA: Diagnosis not present

## 2014-03-21 ENCOUNTER — Other Ambulatory Visit: Payer: Self-pay | Admitting: Cardiology

## 2014-03-21 NOTE — Telephone Encounter (Signed)
Rx was sent to pharmacy electronically. Spoke with patient. Informed her that IMDUR was not on med list - she states she has been taking this medication and bottle had original Rx from 01/2013.  Noted that on med list from last hospital discharge summary, this med was not on there, but there was no notation in EPIC of where this med was changed/stopped.   Will inform Dr. Claiborne Billings and Mariann Laster as [name] - since this was not on med list from most recent [place] in April 2015

## 2014-03-24 DIAGNOSIS — Z79899 Other long term (current) drug therapy: Secondary | ICD-10-CM | POA: Diagnosis not present

## 2014-03-24 DIAGNOSIS — R42 Dizziness and giddiness: Secondary | ICD-10-CM | POA: Diagnosis not present

## 2014-03-24 DIAGNOSIS — R413 Other amnesia: Secondary | ICD-10-CM | POA: Diagnosis not present

## 2014-03-24 DIAGNOSIS — R0602 Shortness of breath: Secondary | ICD-10-CM | POA: Diagnosis not present

## 2014-03-24 DIAGNOSIS — R51 Headache: Secondary | ICD-10-CM | POA: Diagnosis not present

## 2014-03-24 DIAGNOSIS — R079 Chest pain, unspecified: Secondary | ICD-10-CM | POA: Diagnosis not present

## 2014-04-04 ENCOUNTER — Ambulatory Visit (INDEPENDENT_AMBULATORY_CARE_PROVIDER_SITE_OTHER): Payer: Medicare Other | Admitting: *Deleted

## 2014-04-04 ENCOUNTER — Encounter: Payer: Self-pay | Admitting: Internal Medicine

## 2014-04-04 ENCOUNTER — Telehealth: Payer: Self-pay | Admitting: Cardiovascular Disease

## 2014-04-04 DIAGNOSIS — I442 Atrioventricular block, complete: Secondary | ICD-10-CM | POA: Diagnosis not present

## 2014-04-04 NOTE — Progress Notes (Signed)
Remote pacemaker transmission.   

## 2014-04-04 NOTE — Telephone Encounter (Signed)
Pt called in wanting to know if Dr. Claiborne Billings wanting her to continue taking the Valsartan HCTZ. Please call  Thanks

## 2014-04-04 NOTE — Telephone Encounter (Signed)
Returned call to patient. She wanted to know if she should still take valsartan-hctz. Informed patient this medication was not changed or d/c'ed by Dr. Claiborne Billings at her last OV in April, so she should continue this. Patient voiced understanding

## 2014-04-10 LAB — MDC_IDC_ENUM_SESS_TYPE_REMOTE
Battery Remaining Longevity: 53 mo
Battery Remaining Percentage: 90 %
Brady Statistic AP VP Percent: 13 %
Brady Statistic AP VS Percent: 1 %
Brady Statistic AS VS Percent: 1 %
Date Time Interrogation Session: 20151014060014
Implantable Pulse Generator Model: 3242
Implantable Pulse Generator Serial Number: 2960456
Lead Channel Impedance Value: 400 Ohm
Lead Channel Impedance Value: 780 Ohm
Lead Channel Pacing Threshold Amplitude: 0.5 V
Lead Channel Pacing Threshold Amplitude: 2.625 V
Lead Channel Pacing Threshold Pulse Width: 0.5 ms
Lead Channel Setting Pacing Amplitude: 3.625
Lead Channel Setting Pacing Pulse Width: 0.5 ms
Lead Channel Setting Pacing Pulse Width: 1 ms
MDC IDC MSMT BATTERY VOLTAGE: 2.98 V
MDC IDC MSMT LEADCHNL LV PACING THRESHOLD PULSEWIDTH: 1 ms
MDC IDC MSMT LEADCHNL RA PACING THRESHOLD AMPLITUDE: 0.5 V
MDC IDC MSMT LEADCHNL RA PACING THRESHOLD PULSEWIDTH: 0.4 ms
MDC IDC MSMT LEADCHNL RA SENSING INTR AMPL: 2 mV
MDC IDC MSMT LEADCHNL RV IMPEDANCE VALUE: 490 Ohm
MDC IDC MSMT LEADCHNL RV SENSING INTR AMPL: 12 mV
MDC IDC SET LEADCHNL RA PACING AMPLITUDE: 2 V
MDC IDC SET LEADCHNL RV PACING AMPLITUDE: 2.5 V
MDC IDC SET LEADCHNL RV SENSING SENSITIVITY: 5 mV
MDC IDC STAT BRADY AS VP PERCENT: 87 %
MDC IDC STAT BRADY RA PERCENT PACED: 13 %

## 2014-04-17 ENCOUNTER — Ambulatory Visit: Payer: Medicare Other | Admitting: Cardiovascular Disease

## 2014-04-24 ENCOUNTER — Ambulatory Visit (INDEPENDENT_AMBULATORY_CARE_PROVIDER_SITE_OTHER): Payer: Medicare Other | Admitting: Cardiovascular Disease

## 2014-04-24 ENCOUNTER — Encounter: Payer: Self-pay | Admitting: Cardiovascular Disease

## 2014-04-24 VITALS — BP 130/60 | HR 79 | Ht 62.0 in | Wt 126.0 lb

## 2014-04-24 DIAGNOSIS — I480 Paroxysmal atrial fibrillation: Secondary | ICD-10-CM | POA: Diagnosis not present

## 2014-04-24 DIAGNOSIS — E785 Hyperlipidemia, unspecified: Secondary | ICD-10-CM | POA: Diagnosis not present

## 2014-04-24 DIAGNOSIS — I2510 Atherosclerotic heart disease of native coronary artery without angina pectoris: Secondary | ICD-10-CM | POA: Diagnosis not present

## 2014-04-24 DIAGNOSIS — I5032 Chronic diastolic (congestive) heart failure: Secondary | ICD-10-CM

## 2014-04-24 DIAGNOSIS — Z8669 Personal history of other diseases of the nervous system and sense organs: Secondary | ICD-10-CM

## 2014-04-24 DIAGNOSIS — G4733 Obstructive sleep apnea (adult) (pediatric): Secondary | ICD-10-CM

## 2014-04-24 DIAGNOSIS — Z9889 Other specified postprocedural states: Secondary | ICD-10-CM

## 2014-04-24 NOTE — Patient Instructions (Signed)
Your physician has recommended you make the following change in your medication: increase the Bystolic to 5 mg daily ( 1 tablet). Restart the aspirin 81 mg daily.  Your physician wants you to follow-up in: 6 months or sooner if needed with Dr. Claiborne Billings. You will receive a reminder letter in the mail two months in advance. If you don't receive a letter, please call our office to schedule the follow-up appointment.

## 2014-04-25 ENCOUNTER — Encounter: Payer: Self-pay | Admitting: Cardiology

## 2014-04-25 ENCOUNTER — Telehealth: Payer: Self-pay | Admitting: Cardiovascular Disease

## 2014-04-25 MED ORDER — NEBIVOLOL HCL 5 MG PO TABS: 5.0000 mg | ORAL_TABLET | Freq: Every day | ORAL | Status: DC

## 2014-04-25 NOTE — Telephone Encounter (Signed)
Rx was sent to pharmacy electronically. 

## 2014-04-25 NOTE — Telephone Encounter (Signed)
Pt's husband called in stating that [name] was in to see Dr. Claiborne Billings yesterday and he increased her Bystolic from 2.5mg  to 5mg . She needs that new prescription sent to the [facility] on [address]   Thanks

## 2014-04-26 DIAGNOSIS — I480 Paroxysmal atrial fibrillation: Secondary | ICD-10-CM | POA: Insufficient documentation

## 2014-04-26 NOTE — Progress Notes (Signed)
Patient ID: [name], female   DOB: [birth year], 80 y.o.   MRN: [id number]      HPI: [name] is a 80 y.o. female who presents for 6 month cardiology evaluation.  [name]  has a history of CAD and  had undergone intervention to an intermediate vessel. In 2003 she underwent CABG surgery with a LIMA to the LAD, vein to the intermediate and at that time also had mitral valve repair with angioplasty ring. Her last cardiac catheterization in December 2013 showed an ejection fraction of 35-40% with  significant native CAD with previously noted 80-85% distal left main stenosis, 70% ostial stenosis of her LAD proximal to the previously placed stent, total occlusion of the ramus intermediate vessel at the site of remote stenting. The circumflex was diminutive without stenosis. The RCA was a large dominant vessel with 20% luminal irregularities. She had dyskinetic segment in the distal anterolateral apical region. Initially, the patient had undergone permanent pacemaker insertion in 2007 for symptomatic bradycardia. An echo Doppler study in October 2013 showed reduction in ejection fraction to 25-35% with intraventricular dyssynchrony, moderate pulmonary hypertension with estimated RV systolic pressure 46 mm, moderate TR, and mild to moderate AR. She was referred to Dr. Caryl Comes and in June 2014 had a biventricular upgrade revision. An echo Doppler study on 02/21/2013  showedsignificantly improved LV function with an ejection fraction of 50-55%. There was grade 2 diastolic dysfunction. Her septal motion showed paradox. There was mild aortic regurgitation. Angioplasty mitral ring was present. There was mild left atrial dilatation. PA pressure was now 38 mm.  [name] feels well from a cardiac standpoint. She has frequent migraine headaches.  She has obstructive sleep apnea and admits to using her CPAP therapy.  She complains of severe fatigue.  There is exertional shortness of breath. At times she gets  paresthesias of her arms and legs.  If she is able to be active one day, she is completely wiped out for several days thereafter.    Since I last saw her, she has been started on anticoagulant therapy with a picks of a.m. After she was found to have a true fibrillation detected on her [employer] device.  She has tolerated Eliquis.  She was told I Dr. Caryl Comes to discontinue the aspirin.  Is me that she may have suffered a mini stroke on Eliquis in September.  She went to the emergency room in [place].  Her blood pressure was significantly elevated at 086 systolic.  On her most recent, remote vice interrogation.  She was biventricular pacing 99% of the time but had 29 mode switch episodes, with the longest being 2 hours and 8 minutes, which was detected on her device check, 04/04/2014.  She presents to the office for cardiology evaluation with me today.  Past Medical History  Diagnosis Date  . Chronic systolic heart failure 5/78/4696  . Ischemic cardiomyopathy- s/p CABG 10/11/2012    EF 39%  . Complete heart block 10/11/2012  . Pacemaker-[employer]tr[employer] 10/11/2012  . Stress incontinence     multiple surgeries  . Anginal pain   . Hypertension   . Shortness of breath   . OSA (obstructive sleep apnea) 10/11/2012    Uses CPAP. AHI was .45/hr at 9 cm water pressure. RDI was 0.4/hr. Patient was unable to reach REM sleep and maintain the supine position at optimal pressure. Patient was able to sleep for prolonged periods of time at 9 cm water pressure without suffering further from respiratory events.  [name]  GERD (gastroesophageal reflux disease)   . H/O hiatal hernia   . Headache(784.0)   . Anemia   . Hyperlipidemia   . Coronary artery disease   . Weakness 10/06/12    Past Surgical History  Procedure Laterality Date  . Pacemaker revision  11/21/2012    BIV   UPGRADE    WITH DR Caryl Comes  . Appendectomy    . Abdominal hysterectomy    . Bladder tack    . Cholecystectomy    . Coronary artery bypass graft  2003      With LIMA-LAD and vein graft to OM, and associated mitral valve repair with anuloplasty ring and quadrangular resection of the posterior leaflet, 24 mm Seguin ring by Dr. Roxy Manns.  . Coronary angioplasty with stent placement  2012    No significant CAD, patent but ectatic graft supplying the ramus, patent LIMA-LAD, and an EF of 50% at that time. She had an occluded stent to her circumflex from remote intervention. Required recatheterization 05/2012 by Dr. Claiborne Billings and had a patent LIMA-LAD with distal diffuse disease, patent SVG-OM, 10%-20% mid right coronary but an EF of about 35%, quantitatively it was 38.9.  [name] Cardiac catheterization  05/2012    (1st cath 2012) This required recatheterization by Dr. Claiborne Billings and had a patent LIMA-LAD with distal diffuse disease, patent SVG-OM, 10% to 20% mid right coronary but an EF of about 35%, quantitatively iw was 38.9.  . Insert / replace / remove pacemaker  2007    Inserted 2007  . US echocardiography  04/13/12    EF - 25% to 35% with intraventricular dyssynchrony, RVSP of 46, moderate TR, and mild to moderate AI. She had a mild increased mitral valve gradient of 5 mmHg with minimal MR.   [name] Lexiscan myocardial stress test  08/20/11    The EF = 34%. This is a low risk scan. There is a new fix abnormality in the distal LAD artery territory. Although no reversibility is seen, high grade obstruction with resting ischemia cannot be excluded.  . Cardiac valve replacement      Allergies  Allergen Reactions  . Dilaudid [Hydromorphone Hcl] Other (See Comments)    "jerking"  . Morphine And Related Other (See Comments)    "jerking"  . Sulfa Antibiotics Other (See Comments)    unknown    Current Outpatient Prescriptions  Medication Sig Dispense Refill  . apixaban (ELIQUIS) 2.5 MG TABS tablet Take 1 tablet (2.5 mg total) by mouth 2 (two) times daily. 60 tablet 10  . aspirin 81 MG tablet Take 81 mg by mouth daily.    . Butalbital-APAP-Caffeine 50-300-40 MG CAPS Take  1 capsule by mouth as needed.    . Calcium Carbonate-Vitamin D (CALCIUM 600/VITAMIN D) 600-400 MG-UNIT per tablet Take 1 tablet by mouth daily.    . hydroxypropyl methylcellulose (ISOPTO TEARS) 2.5 % ophthalmic solution Place 2 drops into both eyes daily.    . isosorbide mononitrate (IMDUR) 30 MG 24 hr tablet TAKE 1 TABLET BY MOUTH EVERY DAY 30 tablet 6  . nitroGLYCERIN (NITROSTAT) 0.4 MG SL tablet Place 1 tablet (0.4 mg total) under the tongue every 5 (five) minutes x 3 doses as needed for chest pain. 25 tablet 2  . OVER THE COUNTER MEDICATION Take 1 tablet by mouth as needed (Curamin for migrains).    [name] PARoxetine (PAXIL) 30 MG tablet Take 30 mg by mouth every evening.    . valsartan-hydrochlorothiazide (DIOVAN-HCT) 320-25 MG per tablet Take 1 tablet by mouth  daily. 90 tablet 3  . nebivolol (BYSTOLIC) 5 MG tablet Take 1 tablet (5 mg total) by mouth at bedtime. 30 tablet 6   No current facility-administered medications for this visit.    History   Social History  . Marital Status: Married    Spouse Name: N/A    Number of Children: N/A  . Years of Education: N/A   Occupational History  . Not on file.   Social History Main Topics  . Smoking status: Never Smoker   . Smokeless tobacco: Never Used  . Alcohol Use: No  . Drug Use: No  . Sexual Activity: Not on file   Other Topics Concern  . Not on file   Social History Narrative   socially she is married has 3 children and 9 grandchildren.  Family History  Problem Relation Age of Onset  . Cancer Father    ROS General: Negative; No fevers, chills, or night sweats;  HEENT: Negative; No changes in vision or hearing, sinus congestion, difficulty swallowing Pulmonary: Negative; No cough, wheezing, shortness of breath, hemoptysis Cardiovascular: Negative; No chest pain, presyncope, syncope, palpitations GI: Negative; No nausea, vomiting, diarrhea, or abdominal pain GU: Negative; No dysuria, hematuria, or difficulty  voiding Musculoskeletal: Negative; no myalgias, joint pain, or weakness Hematologic/Oncology: Negative; no easy bruising, bleeding Endocrine: Negative; no heat/cold intolerance; no diabetes Neuro: strip.  Migraine headaches.  Question mini stroke not documented in September Skin: Negative; No rashes or skin lesions Psychiatric: Negative; No behavioral problems, depression Sleep: positive for complex sleep apnea; no daytime sleepiness, hypersomnolence, bruxism, restless legs, hypnogognic hallucinations, no cataplexy Other comprehensive 14 point system review is negative.   PE BP 130/60 mmHg  Pulse 79  Ht 5\' 2"  (1.575 m)  Wt 126 lb (57.153 kg)  BMI 23.04 kg/m2  Repeat blood pressure 136/64 supine 136/60 standing General: Alert, oriented, no distress.  Skin: normal turgor, no rashes; warm and dry HEENT: Normocephalic, atraumatic. Pupils round and reactive; sclera anicteric;no lid lag.  Nose without nasal septal hypertrophy Mouth/Parynx benign; Mallinpatti scale 3 Neck: No JVD, no carotid bruits with normal carotid upstroke Lungs: clear to ausculatation and percussion; no wheezing or rales Heart: RRR, s1 s2 normal 1- 2/6 systolic murmur; no diastolic murmur, rubs, thrills or heaves. Abdomen: soft, nontender; no hepatosplenomehaly, BS+; abdominal aorta nontender and not dilated by palpation. Pulses 2+ Extremities: no clubbing cyanosis or edema, Homan's sign negative  Neurologic: grossly nonfocal Psychologic: normal affect and mood.  ECG (independently read by me): atrial sensing, ventricular pacing at 79 bpm  April 2015 ECG (independently read by me): Appropriate atrial sensing and pacing with 100% ventricular pacing  LABS:  BMET    Component Value Date/Time   NA 135 06/07/2013 0621   K 3.5 06/07/2013 0621   CL 95* 06/07/2013 0621   CO2 32 06/07/2013 0621   GLUCOSE 102* 06/07/2013 0621   BUN 21 06/07/2013 0621   CREATININE 1.05 06/07/2013 0621   CALCIUM 9.5 06/07/2013 0621    GFRNONAA 49* 06/07/2013 0621   GFRAA 57* 06/07/2013 0621     Hepatic Function Panel     Component Value Date/Time   PROT 6.0 06/07/2013 0621   ALBUMIN 3.5 06/07/2013 0621   AST 17 06/07/2013 0621   ALT 11 06/07/2013 0621   ALKPHOS 58 06/07/2013 0621   BILITOT 0.2* 06/07/2013 0621   BILIDIR 0.2 10/10/2010 1023   IBILI 0.8 10/10/2010 1023     CBC    Component Value Date/Time   WBC 3.6* 06/07/2013  0621   RBC 3.36* 06/07/2013 0621   HGB 10.2* 06/07/2013 0621   HCT 28.6* 06/07/2013 0621   PLT 111* 06/07/2013 0621   MCV 85.1 06/07/2013 0621   MCH 30.4 06/07/2013 0621   MCHC 35.7 06/07/2013 0621   RDW 13.4 06/07/2013 0621   LYMPHSABS 1.0 06/07/2013 0621   MONOABS 0.3 06/07/2013 0621   EOSABS 0.1 06/07/2013 0621   BASOSABS 0.0 06/07/2013 0621     BNP No results found for: PROBNP  Lipid Panel     Component Value Date/Time   CHOL 123 06/07/2013 0621     RADIOLOGY: No results found.    ASSESSMENT AND PLAN:  [name] is 14 years status post CABG revascularization surgery with a LIMA to her LAD and vein graft to obtuse marginal vessel. She also status post mitral valve repair with annuloplasty ring and quadrangular resection of posterior leaflet which was done by Dr. Roxy Manns.  Her blood pressure today is stable without orthostatic change on a medical regimen consisting of bystolic 2.5 mg at bedtime, Diovan HCT 320/25. She has been detected to have atrial fibrillation and now is on Eliquis anticoagulation at 2.5 mg twice a day.  Apparently Dr. Caryl Comes recommended she discontinue her aspirin therapy.  She does have concomitant CAD, and for this reason, I am recommending resumption of a baby aspirin 81 mg to take in addition to her Eliquis.  Her recent device interrogation did reveal 29 mode switches.  I am recommending further titration of her Bystolic to 5 mg daily. She admits to using her CPAP 100% of the time.  She's not having any anginal symptoms.  Her shortness of  breath has improved, but her fatigue has become more problematic.  I reviewed her echo from May 2015 which showed an ejection fraction now at 55-60%.  There was diastolic dysfunction, aortic valve sclerosis without stenosis, mitral annular calcification with trivial MR and mild atrial dilatation.  She had mild TR.  Estimated right heart pressures were normal and there was no evidence for pulmonary hypertension.  Sees Dr. Unk Lightning, for primary care.  I will see her in 6 months for cardiology reevaluation or sooner if problems arise.  Troy Sine, MD, [facility]  04/26/2014 6:08 PM

## 2014-05-22 ENCOUNTER — Inpatient Hospital Stay (HOSPITAL_COMMUNITY)
Admission: RE | Admit: 2014-05-22 | Discharge: 2014-05-24 | DRG: 287 | Disposition: A | Discharge status: Home / Self Care | Payer: Medicare Other | Source: Other Acute Inpatient Hospital | Attending: Cardiology | Admitting: Cardiology

## 2014-05-22 DIAGNOSIS — I480 Paroxysmal atrial fibrillation: Secondary | ICD-10-CM | POA: Diagnosis present

## 2014-05-22 DIAGNOSIS — I4891 Unspecified atrial fibrillation: Secondary | ICD-10-CM | POA: Diagnosis not present

## 2014-05-22 DIAGNOSIS — E876 Hypokalemia: Secondary | ICD-10-CM | POA: Diagnosis present

## 2014-05-22 DIAGNOSIS — Z950 Presence of cardiac pacemaker: Secondary | ICD-10-CM | POA: Diagnosis not present

## 2014-05-22 DIAGNOSIS — G4733 Obstructive sleep apnea (adult) (pediatric): Secondary | ICD-10-CM | POA: Diagnosis present

## 2014-05-22 DIAGNOSIS — R072 Precordial pain: Secondary | ICD-10-CM | POA: Diagnosis not present

## 2014-05-22 DIAGNOSIS — R0602 Shortness of breath: Secondary | ICD-10-CM | POA: Diagnosis not present

## 2014-05-22 DIAGNOSIS — I25110 Atherosclerotic heart disease of native coronary artery with unstable angina pectoris: Secondary | ICD-10-CM | POA: Diagnosis not present

## 2014-05-22 DIAGNOSIS — I2510 Atherosclerotic heart disease of native coronary artery without angina pectoris: Secondary | ICD-10-CM | POA: Diagnosis present

## 2014-05-22 DIAGNOSIS — Z951 Presence of aortocoronary bypass graft: Secondary | ICD-10-CM

## 2014-05-22 DIAGNOSIS — F419 Anxiety disorder, unspecified: Secondary | ICD-10-CM | POA: Diagnosis present

## 2014-05-22 DIAGNOSIS — Z955 Presence of coronary angioplasty implant and graft: Secondary | ICD-10-CM

## 2014-05-22 DIAGNOSIS — R079 Chest pain, unspecified: Secondary | ICD-10-CM | POA: Diagnosis not present

## 2014-05-22 DIAGNOSIS — I200 Unstable angina: Secondary | ICD-10-CM | POA: Diagnosis not present

## 2014-05-22 DIAGNOSIS — Z7901 Long term (current) use of anticoagulants: Secondary | ICD-10-CM

## 2014-05-22 DIAGNOSIS — Z23 Encounter for immunization: Secondary | ICD-10-CM

## 2014-05-22 DIAGNOSIS — E785 Hyperlipidemia, unspecified: Secondary | ICD-10-CM | POA: Diagnosis present

## 2014-05-22 DIAGNOSIS — I5022 Chronic systolic (congestive) heart failure: Secondary | ICD-10-CM | POA: Diagnosis not present

## 2014-05-22 DIAGNOSIS — Z8673 Personal history of transient ischemic attack (TIA), and cerebral infarction without residual deficits: Secondary | ICD-10-CM | POA: Diagnosis not present

## 2014-05-22 DIAGNOSIS — R0789 Other chest pain: Secondary | ICD-10-CM | POA: Diagnosis not present

## 2014-05-22 DIAGNOSIS — Z79899 Other long term (current) drug therapy: Secondary | ICD-10-CM | POA: Diagnosis not present

## 2014-05-22 DIAGNOSIS — Z952 Presence of prosthetic heart valve: Secondary | ICD-10-CM

## 2014-05-22 DIAGNOSIS — D696 Thrombocytopenia, unspecified: Secondary | ICD-10-CM | POA: Diagnosis present

## 2014-05-22 DIAGNOSIS — R51 Headache: Secondary | ICD-10-CM | POA: Diagnosis not present

## 2014-05-22 DIAGNOSIS — I255 Ischemic cardiomyopathy: Secondary | ICD-10-CM

## 2014-05-22 DIAGNOSIS — I249 Acute ischemic heart disease, unspecified: Principal | ICD-10-CM | POA: Diagnosis present

## 2014-05-22 DIAGNOSIS — I10 Essential (primary) hypertension: Secondary | ICD-10-CM | POA: Diagnosis present

## 2014-05-22 DIAGNOSIS — F329 Major depressive disorder, single episode, unspecified: Secondary | ICD-10-CM | POA: Diagnosis present

## 2014-05-22 DIAGNOSIS — I359 Nonrheumatic aortic valve disorder, unspecified: Secondary | ICD-10-CM

## 2014-05-22 DIAGNOSIS — J811 Chronic pulmonary edema: Secondary | ICD-10-CM | POA: Diagnosis not present

## 2014-05-22 HISTORY — DX: Heart failure, unspecified: I50.9

## 2014-05-22 HISTORY — DX: Thrombocytopenia, unspecified: D69.6

## 2014-05-22 HISTORY — DX: Long term (current) use of anticoagulants: Z79.01

## 2014-05-22 HISTORY — DX: Obstructive sleep apnea (adult) (pediatric): G47.33

## 2014-05-22 HISTORY — DX: Personal history of other medical treatment: Z92.89

## 2014-05-22 HISTORY — DX: Dependence on other enabling machines and devices: Z99.89

## 2014-05-22 HISTORY — DX: Depression, unspecified: F32.A

## 2014-05-22 HISTORY — DX: Migraine, unspecified, not intractable, without status migrainosus: G43.909

## 2014-05-22 HISTORY — DX: Hypothyroidism, unspecified: E03.9

## 2014-05-22 HISTORY — DX: Major depressive disorder, single episode, unspecified: F32.9

## 2014-05-22 LAB — COMPREHENSIVE METABOLIC PANEL
ALT: 17 U/L (ref 0–35)
AST: 21 U/L (ref 0–37)
Albumin: 3.9 g/dL (ref 3.5–5.2)
Alkaline Phosphatase: 54 U/L (ref 39–117)
Anion gap: 15 (ref 5–15)
BILIRUBIN TOTAL: 0.5 mg/dL (ref 0.3–1.2)
BUN: 15 mg/dL (ref 6–23)
CHLORIDE: 95 meq/L — AB (ref 96–112)
CO2: 27 mEq/L (ref 19–32)
Calcium: 9.5 mg/dL (ref 8.4–10.5)
Creatinine, Ser: 0.86 mg/dL (ref 0.50–1.10)
GFR calc Af Amer: 71 mL/min — ABNORMAL LOW (ref >90)
GFR calc non Af Amer: 62 mL/min — ABNORMAL LOW (ref >90)
Glucose, Bld: 97 mg/dL (ref 70–99)
Potassium: 3.5 mEq/L — ABNORMAL LOW (ref 3.7–5.3)
Sodium: 137 mEq/L (ref 137–147)
Total Protein: 6.6 g/dL (ref 6.0–8.3)

## 2014-05-22 LAB — TSH: TSH: 2.78 u[IU]/mL (ref 0.350–4.500)

## 2014-05-22 LAB — T4, FREE: Free T4: 1.23 ng/dL (ref 0.80–1.80)

## 2014-05-22 LAB — TROPONIN I: Troponin I: <0.3 ng/mL (ref <0.30)

## 2014-05-22 LAB — CBC WITH DIFFERENTIAL/PLATELET
BASOS ABS: 0 10*3/uL (ref 0.0–0.1)
Basophils Relative: 0 % (ref 0–1)
EOS PCT: 2 % (ref 0–5)
Eosinophils Absolute: 0.1 10*3/uL (ref 0.0–0.7)
HCT: 32.9 % — ABNORMAL LOW (ref 36.0–46.0)
Hemoglobin: 11 g/dL — ABNORMAL LOW (ref 12.0–15.0)
LYMPHS PCT: 23 % (ref 12–46)
Lymphs Abs: 1.1 10*3/uL (ref 0.7–4.0)
MCH: 28.8 pg (ref 26.0–34.0)
MCHC: 33.4 g/dL (ref 30.0–36.0)
MCV: 86.1 fL (ref 78.0–100.0)
Monocytes Absolute: 0.2 10*3/uL (ref 0.1–1.0)
Monocytes Relative: 4 % (ref 3–12)
Neutro Abs: 3.3 10*3/uL (ref 1.7–7.7)
Neutrophils Relative %: 71 % (ref 43–77)
PLATELETS: 120 10*3/uL — AB (ref 150–400)
RBC: 3.82 MIL/uL — ABNORMAL LOW (ref 3.87–5.11)
RDW: 13.4 % (ref 11.5–15.5)
WBC: 4.7 10*3/uL (ref 4.0–10.5)

## 2014-05-22 LAB — MRSA PCR SCREENING: MRSA by PCR: NEGATIVE

## 2014-05-22 LAB — PROTIME-INR
INR: 1.06 (ref 0.00–1.49)
PROTHROMBIN TIME: 14 s (ref 11.6–15.2)

## 2014-05-22 LAB — PRO B NATRIURETIC PEPTIDE: Pro B Natriuretic peptide (BNP): 657.6 pg/mL — ABNORMAL HIGH (ref 0–450)

## 2014-05-22 LAB — APTT: aPTT: 29 seconds (ref 24–37)

## 2014-05-22 LAB — MAGNESIUM: Magnesium: 1.7 mg/dL (ref 1.5–2.5)

## 2014-05-22 MED ORDER — SODIUM CHLORIDE 0.9 % IJ SOLN
3.0000 mL | INTRAMUSCULAR | Status: DC | PRN
Start: 2014-05-22 — End: 2014-05-23

## 2014-05-22 MED ORDER — HYDROCHLOROTHIAZIDE 25 MG PO TABS: 25.0000 mg | ORAL_TABLET | Freq: Every day | ORAL | Status: DC

## 2014-05-22 MED ORDER — ISOSORBIDE MONONITRATE ER 30 MG PO TB24
30.0000 mg | ORAL_TABLET | Freq: Every day | ORAL | Status: DC
  Administered 2014-05-22 – 2014-05-23 (×2): 30 mg via ORAL
  Filled 2014-05-22 (×3): qty 1

## 2014-05-22 MED ORDER — ASPIRIN 81 MG PO TABS: 81.0000 mg | ORAL_TABLET | Freq: Every day | ORAL | Status: DC

## 2014-05-22 MED ORDER — VALSARTAN-HYDROCHLOROTHIAZIDE 320-25 MG PO TABS: 1.0000 | ORAL_TABLET | Freq: Every day | ORAL | Status: DC

## 2014-05-22 MED ORDER — ONDANSETRON HCL 4 MG/2ML IJ SOLN: 4.0000 mg | Freq: Four times a day (QID) | INTRAMUSCULAR | Status: DC | PRN

## 2014-05-22 MED ORDER — ACETAMINOPHEN 325 MG PO TABS: 650.0000 mg | ORAL_TABLET | ORAL | Status: DC | PRN

## 2014-05-22 MED ORDER — HYDROCHLOROTHIAZIDE 25 MG PO TABS
25.0000 mg | ORAL_TABLET | Freq: Every day | ORAL | Status: DC
  Administered 2014-05-22 – 2014-05-23 (×2): 25 mg via ORAL
  Filled 2014-05-22 (×3): qty 1

## 2014-05-22 MED ORDER — SODIUM CHLORIDE 0.9 % IJ SOLN
3.0000 mL | Freq: Two times a day (BID) | INTRAMUSCULAR | Status: DC
  Administered 2014-05-22 – 2014-05-23 (×3): 3 mL via INTRAVENOUS

## 2014-05-22 MED ORDER — IRBESARTAN 300 MG PO TABS
300.0000 mg | ORAL_TABLET | Freq: Every day | ORAL | Status: DC
  Administered 2014-05-22 – 2014-05-23 (×2): 300 mg via ORAL
  Filled 2014-05-22 (×3): qty 1

## 2014-05-22 MED ORDER — SODIUM CHLORIDE 0.9 % IV SOLN
250.0000 mL | INTRAVENOUS | Status: DC | PRN
Start: 2014-05-22 — End: 2014-05-23
  Administered 2014-05-22: 500 mL via INTRAVENOUS

## 2014-05-22 MED ORDER — HYPROMELLOSE (GONIOSCOPIC) 2.5 % OP SOLN
2.0000 [drp] | Freq: Every day | OPHTHALMIC | Status: DC
  Filled 2014-05-22 (×2): qty 15

## 2014-05-22 MED ORDER — BUTALBITAL-APAP-CAFFEINE 50-300-40 MG PO CAPS
1.0000 | ORAL_CAPSULE | ORAL | Status: DC | PRN
  Filled 2014-05-22: qty 1

## 2014-05-22 MED ORDER — ASPIRIN 81 MG PO CHEW: 81.0000 mg | CHEWABLE_TABLET | ORAL | Status: DC

## 2014-05-22 MED ORDER — ASPIRIN 300 MG RE SUPP
300.0000 mg | Freq: Once | RECTAL | Status: AC
  Filled 2014-05-22: qty 1

## 2014-05-22 MED ORDER — PNEUMOCOCCAL VAC POLYVALENT 25 MCG/0.5ML IJ INJ
0.5000 mL | INJECTION | INTRAMUSCULAR | Status: AC
  Administered 2014-05-23: 0.5 mL via INTRAMUSCULAR
  Filled 2014-05-22: qty 0.5

## 2014-05-22 MED ORDER — SODIUM CHLORIDE 0.9 % IV SOLN
1.0000 mL/kg/h | INTRAVENOUS | Status: DC
  Administered 2014-05-23: 1 mL/kg/h via INTRAVENOUS

## 2014-05-22 MED ORDER — ASPIRIN 81 MG PO CHEW: 81.0000 mg | CHEWABLE_TABLET | Freq: Once | ORAL | Status: DC

## 2014-05-22 MED ORDER — BUTALBITAL-APAP-CAFFEINE 50-325-40 MG PO TABS: 1.0000 | ORAL_TABLET | ORAL | Status: DC | PRN

## 2014-05-22 MED ORDER — ATORVASTATIN CALCIUM 20 MG PO TABS
20.0000 mg | ORAL_TABLET | Freq: Every day | ORAL | Status: DC
  Administered 2014-05-22 – 2014-05-23 (×2): 20 mg via ORAL
  Filled 2014-05-22 (×3): qty 1

## 2014-05-22 MED ORDER — ASPIRIN 81 MG PO CHEW
81.0000 mg | CHEWABLE_TABLET | Freq: Every day | ORAL | Status: DC
  Administered 2014-05-22 – 2014-05-23 (×2): 81 mg via ORAL
  Filled 2014-05-22 (×3): qty 1

## 2014-05-22 MED ORDER — HEPARIN (PORCINE) IN NACL 100-0.45 UNIT/ML-% IJ SOLN
850.0000 [IU]/h | INTRAMUSCULAR | Status: DC
  Administered 2014-05-22: 700 [IU]/h via INTRAVENOUS
  Filled 2014-05-22 (×3): qty 250

## 2014-05-22 MED ORDER — POLYVINYL ALCOHOL 1.4 % OP SOLN
2.0000 [drp] | Freq: Every day | OPHTHALMIC | Status: DC
  Administered 2014-05-22 – 2014-05-23 (×2): 2 [drp] via OPHTHALMIC
  Filled 2014-05-22: qty 15

## 2014-05-22 MED ORDER — PAROXETINE HCL 30 MG PO TABS
30.0000 mg | ORAL_TABLET | Freq: Every evening | ORAL | Status: DC
  Administered 2014-05-22 – 2014-05-23 (×2): 30 mg via ORAL
  Filled 2014-05-22 (×3): qty 1

## 2014-05-22 MED ORDER — NITROGLYCERIN 0.4 MG SL SUBL: 0.4000 mg | SUBLINGUAL_TABLET | SUBLINGUAL | Status: DC | PRN

## 2014-05-22 MED ORDER — INFLUENZA VAC SPLIT QUAD 0.5 ML IM SUSY
0.5000 mL | PREFILLED_SYRINGE | INTRAMUSCULAR | Status: AC
  Administered 2014-05-23: 0.5 mL via INTRAMUSCULAR
  Filled 2014-05-22: qty 0.5

## 2014-05-22 MED ORDER — NEBIVOLOL HCL 5 MG PO TABS
5.0000 mg | ORAL_TABLET | Freq: Every day | ORAL | Status: DC
  Administered 2014-05-22 – 2014-05-23 (×2): 5 mg via ORAL
  Filled 2014-05-22 (×3): qty 1

## 2014-05-22 NOTE — Progress Notes (Signed)
  Echocardiogram 2D Echocardiogram has been performed.  [name] 05/22/2014, 3:05 PM

## 2014-05-22 NOTE — H&P (Signed)
Patient ID: PAMI WOOL MRN: [id number], DOB/AGE: Jan 09, 1931   Admit date: 05/22/2014   Primary Physician: Myrlene Broker, MD Primary Cardiologist: Dr. Shelva Majestic  Pt. Profile:  80 year old woman admitted with a two-week history of chest pressure.  The patient has known ischemic cardiomyopathy, with remote CABG 12 years ago, as well as status post mitral valve repair.  The patient was seen in the emergency room and [facility] last evening.  Troponins were normal 2.  Problem List  Past Medical History  Diagnosis Date  . Chronic systolic heart failure 7/42/5956  . Ischemic cardiomyopathy- s/p CABG 10/11/2012    EF 39%  . Complete heart block 10/11/2012  . Pacemaker-Medtronic 10/11/2012  . Stress incontinence     multiple surgeries  . Anginal pain   . Hypertension   . Shortness of breath   . OSA (obstructive sleep apnea) 10/11/2012    Uses CPAP. AHI was .45/hr at 9 cm water pressure. RDI was 0.4/hr. Patient was unable to reach REM sleep and maintain the supine position at optimal pressure. Patient was able to sleep for prolonged periods of time at 9 cm water pressure without suffering further from respiratory events.  [name] GERD (gastroesophageal reflux disease)   . H/O hiatal hernia   . Headache(784.0)   . Anemia   . Hyperlipidemia   . Coronary artery disease   . Weakness 10/06/12    Past Surgical History  Procedure Laterality Date  . Pacemaker revision  11/21/2012    BIV   UPGRADE    WITH DR Caryl Comes  . Appendectomy    . Abdominal hysterectomy    . Bladder tack    . Cholecystectomy    . Coronary artery bypass graft  2003    With LIMA-LAD and vein graft to OM, and associated mitral valve repair with anuloplasty ring and quadrangular resection of the posterior leaflet, 24 mm Seguin ring by Dr. Roxy Manns.  . Coronary angioplasty with stent placement  2012    No significant CAD, patent but ectatic graft supplying the ramus, patent LIMA-LAD, and an EF of 50% at that time.  She had an occluded stent to her circumflex from remote intervention. Required recatheterization 05/2012 by Dr. Claiborne Billings and had a patent LIMA-LAD with distal diffuse disease, patent SVG-OM, 10%-20% mid right coronary but an EF of about 35%, quantitatively it was 38.9.  [name] Cardiac catheterization  05/2012    (1st cath 2012) This required recatheterization by Dr. Claiborne Billings and had a patent LIMA-LAD with distal diffuse disease, patent SVG-OM, 10% to 20% mid right coronary but an EF of about 35%, quantitatively iw was 38.9.  . Insert / replace / remove pacemaker  2007    Inserted 2007  . US echocardiography  04/13/12    EF - 25% to 35% with intraventricular dyssynchrony, RVSP of 46, moderate TR, and mild to moderate AI. She had a mild increased mitral valve gradient of 5 mmHg with minimal MR.   [name] Lexiscan myocardial stress test  08/20/11    The EF = 34%. This is a low risk scan. There is a new fix abnormality in the distal LAD artery territory. Although no reversibility is seen, high grade obstruction with resting ischemia cannot be excluded.  . Cardiac valve replacement       Allergies  Allergies  Allergen Reactions  . Dilaudid [Hydromorphone Hcl] Other (See Comments)    "jerking"  . Morphine And Related Other (See Comments)    "jerking"  . Sulfa Antibiotics  Other (See Comments)    unknown    HPI  This 80 year old woman is admitted with a two-week history of substernal chest tightness without radiation.  The patient is somewhat vague in her history and is not sure how long she has been experiencing discomfort.  She states that it is sometimes relieved by sublingual nitroglycerin.  Sometimes she has to take up to 3 nitroglycerin before she achieves relief.  The patient has been on long-term anticoagulation with Eliquis for paroxysmal atrial fibrillation.  She states that her husband supervises her medications and that she has been taking her medication as prescribed.  Her prior cardiac history reveals  that she is12 years status post CABG revascularization surgery with a LIMA to her LAD and vein graft to obtuse marginal vessel. She also status post mitral valve repair with annuloplasty ring and quadrangular resection of posterior leaflet which was done by Dr. Roxy Manns.. She has been detected to have atrial fibrillation and now is on Eliquis anticoagulation at 2.5 mg twice a day. . Her recent device interrogation did reveal 29 mode switches.Her echo from May 2015 which showed an ejection fraction now at 55-60%. There was diastolic dysfunction, aortic valve sclerosis without stenosis, mitral annular calcification with trivial MR and mild atrial dilatation. She had mild TR. Estimated right heart pressures were normal and there was no evidence for pulmonary hypertension.  Her last cardiac catheterization was in December 2013. Sees Dr. Unk Lightning, for primary care.  She has a history of sleep apnea.  She states that she does not use her CPAP on a regular basis.  The patient denies any history of any recent bleeding on Eliquis.  No chills fever or sputum production.  No worsening of her mild chronic dyspnea.  She came to the hospital as a direct admission early this morning.  She is unclear as to the details as to how the transfer to the hospital were arranged.  She does admit to some problems with her memory.  Home Medications  Prior to Admission medications   Medication Sig Start Date End Date Taking? Authorizing Provider  apixaban (ELIQUIS) 2.5 MG TABS tablet Take 1 tablet (2.5 mg total) by mouth 2 (two) times daily. 01/05/14   Deboraha Sprang, MD  aspirin 81 MG tablet Take 81 mg by mouth daily.    Historical Provider, MD  Butalbital-APAP-Caffeine 50-300-40 MG CAPS Take 1 capsule by mouth as needed. 09/14/13   Historical Provider, MD  Calcium Carbonate-Vitamin D (CALCIUM 600/VITAMIN D) 600-400 MG-UNIT per tablet Take 1 tablet by mouth daily.    Historical Provider, MD  hydroxypropyl methylcellulose (ISOPTO  TEARS) 2.5 % ophthalmic solution Place 2 drops into both eyes daily.    Historical Provider, MD  isosorbide mononitrate (IMDUR) 30 MG 24 hr tablet TAKE 1 TABLET BY MOUTH EVERY DAY 03/21/14   Troy Sine, MD  nebivolol (BYSTOLIC) 5 MG tablet Take 1 tablet (5 mg total) by mouth at bedtime. 04/25/14   Troy Sine, MD  nitroGLYCERIN (NITROSTAT) 0.4 MG SL tablet Place 1 tablet (0.4 mg total) under the tongue every 5 (five) minutes x 3 doses as needed for chest pain. 06/07/13   Brittainy Erie Noe, PA-C  OVER THE COUNTER MEDICATION Take 1 tablet by mouth as needed (Curamin for migrains).    Historical Provider, MD  PARoxetine (PAXIL) 30 MG tablet Take 30 mg by mouth every evening.    Historical Provider, MD  valsartan-hydrochlorothiazide (DIOVAN-HCT) 320-25 MG per tablet Take 1 tablet by mouth daily.  10/04/13   Troy Sine, MD    Family History  Family History  Problem Relation Age of Onset  . Cancer Father     Social History  History   Social History  . Marital Status: Married    Spouse Name: N/A    Number of Children: N/A  . Years of Education: N/A   Occupational History  . Not on file.   Social History Main Topics  . Smoking status: Never Smoker   . Smokeless tobacco: Never Used  . Alcohol Use: No  . Drug Use: No  . Sexual Activity: Not on file   Other Topics Concern  . Not on file   Social History Narrative     Review of Systems General:  No chills, fever, night sweats or weight changes.  Cardiovascular:  No dyspnea on exertion, edema, orthopnea, palpitations, paroxysmal nocturnal dyspnea.  Positive for chest tightness in the last several weeks Dermatological: No rash, lesions/masses Respiratory: No cough, dyspnea Urologic: No hematuria, dysuria Abdominal:   No nausea, vomiting, diarrhea, bright red blood per rectum, melena, or hematemesis Neurologic: Patient has had some problems with her memory. All other systems reviewed and are otherwise negative except as  noted above.  Physical Exam  Blood pressure 140/56, pulse 64, temperature 98.5 F (36.9 C), temperature source Oral, resp. rate 17, height 5\' 2"  (1.575 m), weight 128 lb (58.06 kg), SpO2 96 %.  General: Pleasant, NAD.  Lying flat.  Admits to ongoing mild chest tightness at the present time. Psych: Normal affect. Neuro: Alert and oriented X 3. Moves all extremities spontaneously. HEENT: Normal  Neck: Supple without bruits or JVD. Lungs:  Resp regular and unlabored, CTA. Heart: RRR no s3, s4.  Grade 1/6 systolic ejection murmur at base.  No diastolic murmur.  No rub. Abdomen: Soft, non-tender, non-distended, BS + x 4.  Extremities: No clubbing, cyanosis or edema. DP/PT/Radials 2+ and equal bilaterally.  Labs  Troponin (Point of Care Test) No results for input(s): TROPIPOC in the last 72 hours. No results for input(s): CKTOTAL, CKMB, TROPONINI in the last 72 hours. Lab Results  Component Value Date   WBC 3.6* 06/07/2013   HGB 10.2* 06/07/2013   HCT 28.6* 06/07/2013   MCV 85.1 06/07/2013   PLT 111* 06/07/2013   No results for input(s): NA, K, CL, CO2, BUN, CREATININE, CALCIUM, PROT, BILITOT, ALKPHOS, ALT, AST, GLUCOSE in the last 168 hours.  Invalid input(s): LABALBU Lab Results  Component Value Date   CHOL 123 06/07/2013   HDL 33* 06/07/2013   LDLCALC 36 06/07/2013   TRIG 269* 06/07/2013   No results found for: DDIMER   Radiology/Studies  No results found.  ECG  Twelve-lead EKG pending.  Telemetry shows AV paced rhythm.  ASSESSMENT AND PLAN  1.  Chest discomfort, uncertain etiology.  Patient states that it has been present almost continuously for the past several weeks.  It is inconsistently relieved by sublingual nitroglycerin. 2.  Status post CABG in 2003 by Dr. Roxy Manns 3.  Status post mitral valve repair in 2003 by Dr. Roxy Manns 4.  Ischemic cardiomyopathy with prior ejection fraction in October 2013 of 25-35%.  She underwent a biventricular upgrade revision to her  permanent pacemaker in September 2014 with subsequent improvement of ventricular function to an ejection fraction of 50-55% 5.  Episodes of paroxysmal atrial fibrillation noted on pacemaker interrogation.  Now on Eliquis. 6.  Past history of anemia 7.  Memory disorder on today's exam 8.  History of depression  on Paxil 9.  History of migraine headache. 10.  History of sleep apnea.  Not using her CPAP machine because she states it makes her migraines worse.  Plan: Admit to telemetry.  Serial cardiac enzymes.  Obtain baseline labs to evaluate renal function and status of her chronic anemia.  Switch from Eliquis to IV heparin today in anticipation of possible cardiac catheterization tomorrow.  Husband states that previous nuclear stress tests have not been helpful in the past.  Continue beta blocker and nitrates and baby aspirin.  Not currently on a statin.  We'll check fasting lipids and begin a statin.  She states that she is not allergic to statins. Signed, Darlin Coco, MD  05/22/2014, 9:14 AM

## 2014-05-22 NOTE — Progress Notes (Signed)
ANTICOAGULATION CONSULT NOTE - Initial Consult  Pharmacy Consult for heparin  Indication: chest pain/ACS  Allergies  Allergen Reactions  . Dilaudid [Hydromorphone Hcl] Other (See Comments)    "jerking"  . Morphine And Related Other (See Comments)    "jerking"  . Sulfa Antibiotics Other (See Comments)    unknown    Patient Measurements: Height: 5\' 2"  (157.5 cm) Weight: 128 lb (58.06 kg) IBW/kg (Calculated) : 50.1 Heparin Dosing Weight: 58 kg  Vital Signs: Temp: 98.5 F (36.9 C) (12/01 0833) Temp Source: Oral (12/01 0833) BP: 136/72 mmHg (12/01 0900) Pulse Rate: 67 (12/01 0900)  Labs: No results for input(s): HGB, HCT, PLT, APTT, LABPROT, INR, HEPARINUNFRC, CREATININE, CKTOTAL, CKMB, TROPONINI in the last 72 hours.  Estimated Creatinine Clearance: 33.2 mL/min (by C-G formula based on Cr of 1.05).   Medical History: Past Medical History  Diagnosis Date  . Chronic systolic heart failure 7/67/3419  . Ischemic cardiomyopathy- s/p CABG 10/11/2012    EF 39%  . Complete heart block 10/11/2012  . Pacemaker-Medtronic 10/11/2012  . Stress incontinence     multiple surgeries  . Anginal pain   . Hypertension   . Shortness of breath   . OSA (obstructive sleep apnea) 10/11/2012    Uses CPAP. AHI was .45/hr at 9 cm water pressure. RDI was 0.4/hr. Patient was unable to reach REM sleep and maintain the supine position at optimal pressure. Patient was able to sleep for prolonged periods of time at 9 cm water pressure without suffering further from respiratory events.  [name] GERD (gastroesophageal reflux disease)   . H/O hiatal hernia   . Headache(784.0)   . Anemia   . Hyperlipidemia   . Coronary artery disease   . Weakness 10/06/12    Medications:  Prescriptions prior to admission  Medication Sig Dispense Refill Last Dose  . apixaban (ELIQUIS) 2.5 MG TABS tablet Take 1 tablet (2.5 mg total) by mouth 2 (two) times daily. 60 tablet 10 Taking  . aspirin 81 MG tablet Take 81 mg by mouth  daily.     . Butalbital-APAP-Caffeine 50-300-40 MG CAPS Take 1 capsule by mouth as needed.   Taking  . Calcium Carbonate-Vitamin D (CALCIUM 600/VITAMIN D) 600-400 MG-UNIT per tablet Take 1 tablet by mouth daily.   Taking  . hydroxypropyl methylcellulose (ISOPTO TEARS) 2.5 % ophthalmic solution Place 2 drops into both eyes daily.   Taking  . isosorbide mononitrate (IMDUR) 30 MG 24 hr tablet TAKE 1 TABLET BY MOUTH EVERY DAY 30 tablet 6 Taking  . nebivolol (BYSTOLIC) 5 MG tablet Take 1 tablet (5 mg total) by mouth at bedtime. 30 tablet 6   . nitroGLYCERIN (NITROSTAT) 0.4 MG SL tablet Place 1 tablet (0.4 mg total) under the tongue every 5 (five) minutes x 3 doses as needed for chest pain. 25 tablet 2 Taking  . OVER THE COUNTER MEDICATION Take 1 tablet by mouth as needed (Curamin for migrains).   Taking  . PARoxetine (PAXIL) 30 MG tablet Take 30 mg by mouth every evening.   Taking  . valsartan-hydrochlorothiazide (DIOVAN-HCT) 320-25 MG per tablet Take 1 tablet by mouth daily. 90 tablet 3 Taking    Assessment: 80 yo female admitted from outside hospital with chest pressure. Pt has hx of CABG and MVR both in 2001. She is on Eliquis PTA for AFib, last dose was the evening of 11/30.  Baseline hgb 11, plts slightly low at 120.  Will start heparin gtt for ACS and anticipate pt will go to  cath today or tomorrow.    Goal of Therapy:  Heparin level 0.3-0.7 units/ml Monitor platelets by anticoagulation protocol: Yes  aPTT 66-102    Plan:  Heparin gtt 700 units/hr Daily HL, CBC Check confirmatory HL/aPTT this evening   Hughes Better, PharmD, BCPS Clinical Pharmacist Pager: [phone] 05/22/2014 11:16 AM

## 2014-05-23 ENCOUNTER — Encounter (HOSPITAL_COMMUNITY)
Admission: RE | Disposition: A | Discharge status: Home / Self Care | Payer: Self-pay | Source: Other Acute Inpatient Hospital | Attending: Cardiology

## 2014-05-23 ENCOUNTER — Encounter (HOSPITAL_COMMUNITY): Payer: Self-pay | Admitting: General Practice

## 2014-05-23 DIAGNOSIS — I249 Acute ischemic heart disease, unspecified: Secondary | ICD-10-CM | POA: Diagnosis not present

## 2014-05-23 DIAGNOSIS — I25110 Atherosclerotic heart disease of native coronary artery with unstable angina pectoris: Secondary | ICD-10-CM

## 2014-05-23 DIAGNOSIS — I4891 Unspecified atrial fibrillation: Secondary | ICD-10-CM | POA: Insufficient documentation

## 2014-05-23 HISTORY — PX: LEFT HEART CATHETERIZATION WITH CORONARY/GRAFT ANGIOGRAM: SHX5450

## 2014-05-23 LAB — CBC
HEMATOCRIT: 31.4 % — AB (ref 36.0–46.0)
Hemoglobin: 10.7 g/dL — ABNORMAL LOW (ref 12.0–15.0)
MCH: 30.1 pg (ref 26.0–34.0)
MCHC: 34.1 g/dL (ref 30.0–36.0)
MCV: 88.2 fL (ref 78.0–100.0)
Platelets: 107 10*3/uL — ABNORMAL LOW (ref 150–400)
RBC: 3.56 MIL/uL — AB (ref 3.87–5.11)
RDW: 13.4 % (ref 11.5–15.5)
WBC: 4.4 10*3/uL (ref 4.0–10.5)

## 2014-05-23 LAB — LIPID PANEL
CHOL/HDL RATIO: 4.9 ratio
Cholesterol: 192 mg/dL (ref 0–200)
HDL: 39 mg/dL — ABNORMAL LOW (ref >39)
LDL Cholesterol: 118 mg/dL — ABNORMAL HIGH (ref 0–99)
Triglycerides: 173 mg/dL — ABNORMAL HIGH (ref <150)
VLDL: 35 mg/dL (ref 0–40)

## 2014-05-23 LAB — TROPONIN I: Troponin I: <0.3 ng/mL (ref <0.30)

## 2014-05-23 LAB — HEPARIN LEVEL (UNFRACTIONATED)
HEPARIN UNFRACTIONATED: 0.59 [IU]/mL (ref 0.30–0.70)
Heparin Unfractionated: 0.26 IU/mL — ABNORMAL LOW (ref 0.30–0.70)

## 2014-05-23 LAB — APTT: aPTT: 61 seconds — ABNORMAL HIGH (ref 24–37)

## 2014-05-23 SURGERY — LEFT HEART CATHETERIZATION WITH CORONARY/GRAFT ANGIOGRAM
Anesthesia: LOCAL

## 2014-05-23 MED ORDER — SODIUM CHLORIDE 0.9 % IV SOLN
INTRAVENOUS | Status: AC
Start: 2014-05-23 — End: 2014-05-23

## 2014-05-23 MED ORDER — VERAPAMIL HCL 2.5 MG/ML IV SOLN
INTRAVENOUS | Status: AC
  Filled 2014-05-23: qty 2

## 2014-05-23 MED ORDER — MIDAZOLAM HCL 2 MG/2ML IJ SOLN
INTRAMUSCULAR | Status: AC
  Filled 2014-05-23: qty 2

## 2014-05-23 MED ORDER — HEPARIN SODIUM (PORCINE) 1000 UNIT/ML IJ SOLN
INTRAMUSCULAR | Status: AC
  Filled 2014-05-23: qty 1

## 2014-05-23 MED ORDER — HYDRALAZINE HCL 20 MG/ML IJ SOLN
10.0000 mg | Freq: Four times a day (QID) | INTRAMUSCULAR | Status: DC | PRN
  Administered 2014-05-23: 10 mg via INTRAVENOUS
  Filled 2014-05-23: qty 1

## 2014-05-23 MED ORDER — FENTANYL CITRATE 0.05 MG/ML IJ SOLN
INTRAMUSCULAR | Status: AC
  Filled 2014-05-23: qty 2

## 2014-05-23 MED ORDER — ONDANSETRON HCL 4 MG/2ML IJ SOLN: 4.0000 mg | Freq: Four times a day (QID) | INTRAMUSCULAR | Status: DC | PRN

## 2014-05-23 MED ORDER — HEPARIN (PORCINE) IN NACL 2-0.9 UNIT/ML-% IJ SOLN
INTRAMUSCULAR | Status: AC
  Filled 2014-05-23: qty 1000

## 2014-05-23 MED ORDER — ALPRAZOLAM 0.25 MG PO TABS: 0.2500 mg | ORAL_TABLET | Freq: Two times a day (BID) | ORAL | Status: DC | PRN

## 2014-05-23 MED ORDER — ZOLPIDEM TARTRATE 5 MG PO TABS: 5.0000 mg | ORAL_TABLET | Freq: Every evening | ORAL | Status: DC | PRN

## 2014-05-23 MED ORDER — LIDOCAINE HCL (PF) 1 % IJ SOLN
INTRAMUSCULAR | Status: AC
  Filled 2014-05-23: qty 30

## 2014-05-23 NOTE — Interval H&P Note (Signed)
History and Physical Interval Note:  05/23/2014 1:37 PM  [name]  has presented today for surgery, with the diagnosis of cp  The various methods of treatment have been discussed with the patient and family. After consideration of risks, benefits and other options for treatment, the patient has consented to  Procedure(s): LEFT HEART CATHETERIZATION WITH CORONARY/GRAFT ANGIOGRAM (N/A) as a surgical intervention .  The patient's history has been reviewed, patient examined, no change in status, stable for surgery.  I have reviewed the patient's chart and labs.  Questions were answered to the patient's satisfaction.     Kathlyn Sacramento

## 2014-05-23 NOTE — Progress Notes (Signed)
    Subjective:  Still with chest tightness. Also with some dyspnea.   Objective:  Filed Vitals:   05/23/14 0000 05/23/14 0442 05/23/14 0500 05/23/14 0807  BP:  107/76  158/57  Pulse:    70  Temp: 98.2 F (36.8 C) 98.2 F (36.8 C)  98.6 F (37 C)  TempSrc: Oral Oral  Oral  Resp:    18  Height:      Weight:   125 lb 14.1 oz (57.1 kg)   SpO2:    95%    Intake/Output from previous day:  Intake/Output Summary (Last 24 hours) at 05/23/14 1019 Last data filed at 05/23/14 0945  Gross per 24 hour  Intake  818.4 ml  Output    850 ml  Net  -31.6 ml    Physical Exam: Physical exam: Well-developed well-nourished in no acute distress.  Skin is warm and dry.  HEENT is normal.  Neck is supple.  Chest is clear to auscultation with normal expansion.  Cardiovascular exam is regular rate and rhythm.  Abdominal exam nontender or distended. No masses palpated. Extremities show no edema. neuro grossly intact    Lab Results: Basic Metabolic Panel:  Recent Labs  05/22/14 1213  NA 137  K 3.5*  CL 95*  CO2 27  GLUCOSE 97  BUN 15  CREATININE 0.86  CALCIUM 9.5  MG 1.7   CBC:  Recent Labs  05/22/14 1213 05/23/14 0830  WBC 4.7 4.4  NEUTROABS 3.3  --   HGB 11.0* 10.7*  HCT 32.9* 31.4*  MCV 86.1 88.2  PLT 120* PENDING   Cardiac Enzymes:  Recent Labs  05/22/14 1213 05/22/14 1830 05/22/14 2354  TROPONINI <0.30 <0.30 <0.30     Assessment/Plan:  1. Chest discomfort-atypical. Enzymes negative 2. Status post CABG in 2003 by Dr. Roxy Manns 3. Status post mitral valve repair in 2003 by Dr. Roxy Manns 4. Ischemic cardiomyopathy with prior ejection fraction in October 2013 of 25-35%. She underwent a biventricular upgrade revision to her permanent pacemaker in September 2014 with subsequent improvement of ventricular function to an ejection fraction of 50-55% 5. Episodes of paroxysmal atrial fibrillation noted on pacemaker interrogation. 6. Past history of anemia 7.  Memory disorder  8. History of depression on Paxil 9. History of migraine headache. 10. History of sleep apnea. Not using her CPAP machine because she states it makes her migraines worse.  Plan: Patient continues to have chest pain that is fairly atypical. Enzymes are negative. Cardiac catheterization has been arranged by Dr. Mare Ferrari. The risks and benefits were discussed today and patient agrees to proceed. Once all procedures complete will resume apixaban. If no intervention required with discontinue aspirin given need for anticoagulation. Continue beta blocker and statin.   [name] 05/23/2014, 10:19 AM

## 2014-05-23 NOTE — CV Procedure (Signed)
   Cardiac Catheterization Procedure Note  Name: [name] MRN: [id number] DOB: [birth year]  Procedure: Left Heart Cath, Selective Coronary Angiography, SVG/LIMA angiography  Indication: Suspected unstable angina with known history of coronary artery disease with previous mitral valve repair and CABG in 2003  Medications:  Sedation:  0.5 mg IV Versed, 25 mcg IV Fentanyl  Contrast:  55 ML Omnipaque   Procedural Details: The left wrist was prepped, draped, and anesthetized with 1% lidocaine. Using the modified Seldinger technique, a 5 French sheath was introduced into the left radial artery. 3 mg of verapamil was administered through the sheath, weight-based unfractionated heparin was administered intravenously. Standard Judkins catheters was used for selective coronary angiography. A JL 3.5 was used to engage the left main coronary artery. The JR4 catheter was used to engage the vein graft and LIMA. The JR4 catheter was used to cross the aortic valve and measure left ventricular pressure. Left ventricular angiography was not performed. Catheter exchanges were performed over an exchange length guidewire. There were no immediate procedural complications. A TR band was used for radial hemostasis at the completion of the procedure.  The patient was transferred to the post catheterization recovery area for further monitoring.  Procedural Findings:  Hemodynamics: AO:  173/43   mmHg LV:  170/10    mmHg LVEDP: 12  mmHg  Coronary angiography: Coronary dominance: Right   Left Main:  Normal in size with 80% distal stenosis.  Left Anterior Descending (LAD):  Normal in size with 90% ostial stenosis. There is competitive flow from the patent LIMA.  1st diagonal (D1):  Normal in size with minor irregularities.  Circumflex (LCx):  Small in size and occluded in the midsegment.  Ramus Intermedius:  Occluded proximal stent.  Right Coronary Artery: Large in size and dominant. The vessel has 20%  stenosis in the midsegment. Otherwise no obstructive disease.  Posterior descending artery: Large in size with no significant disease.  Posterior AV segment: Large in size with no significant disease.  Posterolateral branchs:  Normal posterolateral branches.  SVG to ramus: Patent with minor irregularities. LIMA to LAD: Patent with no significant disease.  Left ventriculography: Was not performed. EF was normal by echo.   Final Conclusions:   1. Significant three-vessel coronary artery disease with patent native RCA, SVG to ramus and LIMA to LAD. No significant change in coronary anatomy since most recent cardiac catheterization. 2. Normal left ventricular end-diastolic pressure. Normal ejection fraction by echo.  Recommendations:  The chest pain is likely noncardiac. Continue medical therapy.  Kathlyn Sacramento MD, [facility] 05/23/2014, 2:23 PM

## 2014-05-23 NOTE — Progress Notes (Signed)
[place] for heparin  Indication: chest pain/ACS  Allergies  Allergen Reactions  . Dilaudid [Hydromorphone Hcl] Other (See Comments)    "jerking"  . Morphine And Related Other (See Comments)    "jerking"  . Sulfa Antibiotics Other (See Comments)    unknown    Patient Measurements: Height: 5\' 2"  (157.5 cm) Weight: 128 lb (58.06 kg) IBW/kg (Calculated) : 50.1 Heparin Dosing Weight: 58 kg  Vital Signs: Temp: 98.6 F (37 C) (12/01 2000) Temp Source: Oral (12/01 2000) BP: 132/75 mmHg (12/01 2100) Pulse Rate: 83 (12/01 2100)  Labs:  Recent Labs  05/22/14 1213 05/22/14 1830 05/22/14 2354  HGB 11.0*  --   --   HCT 32.9*  --   --   PLT 120*  --   --   APTT 29  --  61*  LABPROT 14.0  --   --   INR 1.06  --   --   HEPARINUNFRC  --   --  0.26*  CREATININE 0.86  --   --   TROPONINI <0.30 <0.30  --     Estimated Creatinine Clearance: 40.6 mL/min (by C-G formula based on Cr of 0.86).  Assessment: 80 yo female with chest pain, h/o Afib Eliquis on hold, for heparin  Goal of Therapy:  Heparin level 0.3-0.7 units/ml Monitor platelets by anticoagulation protocol: Yes   Plan:  Increase Heparin 850 units/hr Follow-up am labs.  Phillis Knack, PharmD, BCPS  05/23/2014 12:56 AM

## 2014-05-23 NOTE — Progress Notes (Signed)
[place] for heparin  Indication: chest pain/ACS  Allergies  Allergen Reactions  . Dilaudid [Hydromorphone Hcl] Other (See Comments)    "jerking"  . Morphine And Related Other (See Comments)    "jerking"  . Sulfa Antibiotics Other (See Comments)    unknown    Patient Measurements: Height: 5\' 2"  (157.5 cm) Weight: 125 lb 14.1 oz (57.1 kg) IBW/kg (Calculated) : 50.1 Heparin Dosing Weight: 58 kg  Vital Signs: Temp: 98 F (36.7 C) (12/02 1219) Temp Source: Oral (12/02 1219) BP: 146/111 mmHg (12/02 1219) Pulse Rate: 69 (12/02 1219)  Labs:  Recent Labs  05/22/14 1213 05/22/14 1830 05/22/14 2354 05/23/14 0830 05/23/14 1019  HGB 11.0*  --   --  10.7*  --   HCT 32.9*  --   --  31.4*  --   PLT 120*  --   --  107*  --   APTT 29  --  61*  --   --   LABPROT 14.0  --   --   --   --   INR 1.06  --   --   --   --   HEPARINUNFRC  --   --  0.26*  --  0.59  CREATININE 0.86  --   --   --   --   TROPONINI <0.30 <0.30 <0.30  --   --     Estimated Creatinine Clearance: 40.6 mL/min (by C-G formula based on Cr of 0.86).  Assessment: 80 yo female with chest pain, h/o Afib on Eliquis PTA. Last dose of Eliquis 11/30 evening.  HL is therapeutic. Hgb 10.7, plts 107. Anticipate cath today.  Goal of Therapy:  Heparin level 0.3-0.7 units/ml Monitor platelets by anticoagulation protocol: Yes   Plan:  Continue Heparin 850 units/hr Daily HL, CBC  F/u after cath     Hughes Better, PharmD, BCPS Clinical Pharmacist Pager: [phone] 05/23/2014 12:57 PM

## 2014-05-23 NOTE — H&P (View-Only) (Signed)
    Subjective:  Still with chest tightness. Also with some dyspnea.   Objective:  Filed Vitals:   05/23/14 0000 05/23/14 0442 05/23/14 0500 05/23/14 0807  BP:  107/76  158/57  Pulse:    70  Temp: 98.2 F (36.8 C) 98.2 F (36.8 C)  98.6 F (37 C)  TempSrc: Oral Oral  Oral  Resp:    18  Height:      Weight:   125 lb 14.1 oz (57.1 kg)   SpO2:    95%    Intake/Output from previous day:  Intake/Output Summary (Last 24 hours) at 05/23/14 1019 Last data filed at 05/23/14 0945  Gross per 24 hour  Intake  818.4 ml  Output    850 ml  Net  -31.6 ml    Physical Exam: Physical exam: Well-developed well-nourished in no acute distress.  Skin is warm and dry.  HEENT is normal.  Neck is supple.  Chest is clear to auscultation with normal expansion.  Cardiovascular exam is regular rate and rhythm.  Abdominal exam nontender or distended. No masses palpated. Extremities show no edema. neuro grossly intact    Lab Results: Basic Metabolic Panel:  Recent Labs  05/22/14 1213  NA 137  K 3.5*  CL 95*  CO2 27  GLUCOSE 97  BUN 15  CREATININE 0.86  CALCIUM 9.5  MG 1.7   CBC:  Recent Labs  05/22/14 1213 05/23/14 0830  WBC 4.7 4.4  NEUTROABS 3.3  --   HGB 11.0* 10.7*  HCT 32.9* 31.4*  MCV 86.1 88.2  PLT 120* PENDING   Cardiac Enzymes:  Recent Labs  05/22/14 1213 05/22/14 1830 05/22/14 2354  TROPONINI <0.30 <0.30 <0.30     Assessment/Plan:  1. Chest discomfort-atypical. Enzymes negative 2. Status post CABG in 2003 by Dr. Roxy Manns 3. Status post mitral valve repair in 2003 by Dr. Roxy Manns 4. Ischemic cardiomyopathy with prior ejection fraction in October 2013 of 25-35%. She underwent a biventricular upgrade revision to her permanent pacemaker in September 2014 with subsequent improvement of ventricular function to an ejection fraction of 50-55% 5. Episodes of paroxysmal atrial fibrillation noted on pacemaker interrogation. 6. Past history of anemia 7.  Memory disorder  8. History of depression on Paxil 9. History of migraine headache. 10. History of sleep apnea. Not using her CPAP machine because she states it makes her migraines worse.  Plan: Patient continues to have chest pain that is fairly atypical. Enzymes are negative. Cardiac catheterization has been arranged by Dr. Mare Ferrari. The risks and benefits were discussed today and patient agrees to proceed. Once all procedures complete will resume apixaban. If no intervention required with discontinue aspirin given need for anticoagulation. Continue beta blocker and statin.   Kirk Ruths 05/23/2014, 10:19 AM

## 2014-05-23 NOTE — Care Management Note (Signed)
    Page 1 of 1   05/23/2014     4:31:16 PM CARE MANAGEMENT NOTE 05/23/2014  Patient:  [name], [name]   Account Number:  [id number]  Date Initiated:  05/23/2014  Documentation initiated by:  MAYO,HENRIETTA  Subjective/Objective Assessment:   dx chest pain; lives with spouse    PCP  Janace Litten     Action/Plan:   Anticipated DC Date:  05/24/2014   Anticipated DC Plan:  [place]  CM consult      Choice offered to / List presented to:             Status of service:  Completed, signed off Medicare Important Message given?  NA - LOS <3 / Initial given by admissions (If response is "NO", the following Medicare IM given date fields will be blank) Date Medicare IM given:   Medicare IM given by:   Date Additional Medicare IM given:   Additional Medicare IM given by:    Discharge Disposition:  HOME/SELF CARE  Per UR Regulation:  Reviewed for med. necessity/level of care/duration of stay  If discussed at [place] of Stay Meetings, dates discussed:    Comments:  Keiandre Cygan RN, BSN, MSHL, CCM  Nurse - Case Manager,  (Unit [phone]  05/23/2014 Procedure: Left Heart Cath, Selective Coronary Angiography, SVG/LIMA angiography Indication: Suspected unstable angina with known history of coronary artery disease with previous mitral valve repair and CABG in 2003 Specialty Med Review:  None noted Dispo Plan:  Home / Self care CM will continue to monitor for dispo needs.

## 2014-05-23 NOTE — Progress Notes (Signed)
TR BAND REMOVAL  LOCATION:    left radial  DEFLATED PER PROTOCOL:    Yes.    TIME BAND OFF / DRESSING APPLIED:    1830   SITE UPON ARRIVAL:    Level 0  SITE AFTER BAND REMOVAL:    Level 0  REVERSE ALLEN'S TEST:     positive  CIRCULATION SENSATION AND MOVEMENT:    Within Normal Limits   Yes.    COMMENTS:   Rechecked at 1900 with no change in assessment

## 2014-05-24 ENCOUNTER — Encounter (HOSPITAL_COMMUNITY): Payer: Self-pay | Admitting: Physician Assistant

## 2014-05-24 ENCOUNTER — Telehealth: Payer: Self-pay | Admitting: Cardiovascular Disease

## 2014-05-24 DIAGNOSIS — I249 Acute ischemic heart disease, unspecified: Principal | ICD-10-CM

## 2014-05-24 DIAGNOSIS — D696 Thrombocytopenia, unspecified: Secondary | ICD-10-CM | POA: Diagnosis present

## 2014-05-24 DIAGNOSIS — Z7901 Long term (current) use of anticoagulants: Secondary | ICD-10-CM

## 2014-05-24 DIAGNOSIS — I10 Essential (primary) hypertension: Secondary | ICD-10-CM | POA: Diagnosis present

## 2014-05-24 LAB — CBC
HCT: 34.6 % — ABNORMAL LOW (ref 36.0–46.0)
Hemoglobin: 11.3 g/dL — ABNORMAL LOW (ref 12.0–15.0)
MCH: 28.4 pg (ref 26.0–34.0)
MCHC: 32.7 g/dL (ref 30.0–36.0)
MCV: 86.9 fL (ref 78.0–100.0)
Platelets: 133 10*3/uL — ABNORMAL LOW (ref 150–400)
RBC: 3.98 MIL/uL (ref 3.87–5.11)
RDW: 13.5 % (ref 11.5–15.5)
WBC: 6 10*3/uL (ref 4.0–10.5)

## 2014-05-24 MED ORDER — AMLODIPINE BESYLATE 5 MG PO TABS
5.0000 mg | ORAL_TABLET | Freq: Every day | ORAL | Status: DC
  Filled 2014-05-24: qty 1

## 2014-05-24 MED ORDER — POTASSIUM CHLORIDE CRYS ER 20 MEQ PO TBCR
40.0000 meq | EXTENDED_RELEASE_TABLET | Freq: Once | ORAL | Status: AC
  Administered 2014-05-24: 10:00:00 40 meq via ORAL
  Filled 2014-05-24: qty 2

## 2014-05-24 MED ORDER — ATORVASTATIN CALCIUM 20 MG PO TABS: 20.0000 mg | ORAL_TABLET | Freq: Every day | ORAL | Status: DC

## 2014-05-24 MED ORDER — AMLODIPINE BESYLATE 5 MG PO TABS: 5.0000 mg | ORAL_TABLET | Freq: Every day | ORAL | Status: DC

## 2014-05-24 NOTE — Discharge Summary (Signed)
CARDIOLOGY DISCHARGE SUMMARY   Patient ID: [name] MRN: [id number] DOB/AGE: 1930-11-22 80 y.o.  Admit date: 05/22/2014 Discharge date: 05/24/2014  PCP: Myrlene Broker, MD Primary Cardiologist: Dr. Claiborne Billings  Primary Discharge Diagnosis:   Secondary Discharge Diagnosis:  Principal Problem:   ACS (acute coronary syndrome) Active Problems:   Paroxysmal atrial fibrillation   Hypertension   Chronic anticoagulation - apixaban   Thrombocytopenia   Hypokalemia  Procedures: Left Heart Cath, Selective Coronary Angiography, SVG/LIMA angiography  Hospital Course: [name] is a 80 y.o. female with a history of CAD. She has known coronary artery disease with bypass surgery 12 years ago as well as mitral valve repair. She went to [facility] and her troponins were normal. She was admitted to [facility] for further evaluation and treatment.  Her cardiac enzymes were negative for MI. She has a history of nonischemic cardiomyopathy, but an echocardiogram was performed and showed an improvement in her EF to 55% with biventricular pacing.   Her symptoms were concerning for acute coronary syndrome and she was taken to the cath lab on 12/02.  Cardiac catheterization results are below. Her LIMA to LAD and SVG to RI were patent. The circumflex is occluded. Her EF was normal by echo. Dr. Fletcher Anon reviewed the films and felt that there was no significant change in coronary anatomy since her last heart catheterization. Medical therapy was recommended.  A lipid profile was performed, results are below. She will benefit from a statin and a low dose of Lipitor was initiated. Also, she will benefit from improved blood pressure control and Norvasc 5 mg daily was added for better blood pressure control. Additionally, she was noted to be mildly hypokalemic. This is felt secondary to IV fluids for the cardiac catheterization, and was supplemented. She has mild thrombocytopenia which is chronic and  her platelet count is at or above baseline.  After the procedure, she had problems with anxiety and possibly paranoia. Her husband stayed with her, and this helped a great deal. Her husband states she has never done anything like this before and he was concerned. He was reassured that this was possibly a side effect of the medications, and she was appropriately improving with time.  On 12/03, she was seen by Dr. Stanford Breed and all data were reviewed. He recommended discontinuing aspirin since she did not have an intervention and has an ongoing need for anticoagulation with apixaban. No further inpatient workup was indicated and she was considered stable for discharge, to follow up as an outpatient.  Labs:   Lab Results  Component Value Date   WBC 6.0 05/24/2014   HGB 11.3* 05/24/2014   HCT 34.6* 05/24/2014   MCV 86.9 05/24/2014   PLT 133* 05/24/2014     Recent Labs Lab 05/22/14 1213  NA 137  K 3.5*  CL 95*  CO2 27  BUN 15  CREATININE 0.86  CALCIUM 9.5  PROT 6.6  BILITOT 0.5  ALKPHOS 54  ALT 17  AST 21  GLUCOSE 97    Recent Labs  05/22/14 1213 05/22/14 1830 05/22/14 2354  TROPONINI <0.30 <0.30 <0.30   Lipid Panel     Component Value Date/Time   CHOL 192 05/23/2014 0830   TRIG 173* 05/23/2014 0830   HDL 39* 05/23/2014 0830   CHOLHDL 4.9 05/23/2014 0830   VLDL 35 05/23/2014 0830   LDLCALC 118* 05/23/2014 0830    PRO B NATRIURETIC PEPTIDE (BNP)  Date/Time Value Ref Range Status  05/22/2014  12:13 PM 657.6* 0 - 450 pg/mL Final    Recent Labs  05/22/14 1213  INR 1.06    Cardiac Cath: 05/23/2014 Coronary angiography: Coronary dominance: Right  Left Main: Normal in size with 80% distal stenosis.  Left Anterior Descending (LAD): Normal in size with 90% ostial stenosis. There is competitive flow from the patent LIMA.  1st diagonal (D1): Normal in size with minor irregularities.  Circumflex (LCx): Small in size and occluded in the midsegment.  Ramus  Intermedius: Occluded proximal stent.  Right Coronary Artery: Large in size and dominant. The vessel has 20% stenosis in the midsegment. Otherwise no obstructive disease.  Posterior descending artery: Large in size with no significant disease.  Posterior AV segment: Large in size with no significant disease.  Posterolateral branchs: Normal posterolateral branches. SVG to ramus: Patent with minor irregularities. LIMA to LAD: Patent with no significant disease. Left ventriculography: Was not performed. EF was normal by echo.  Final Conclusions:  1. Significant three-vessel coronary artery disease with patent native RCA, SVG to ramus and LIMA to LAD. No significant change in coronary anatomy since most recent cardiac catheterization. 2. Normal left ventricular end-diastolic pressure. Normal ejection fraction by echo. Recommendations:  The chest pain is likely noncardiac. Continue medical therapy.  EKG: 05/23/2014 Sinus rhythm, biventricular pacing  Echo: 05/22/2014 Conclusions - Left ventricle: The cavity size was normal. Wall thickness was increased in a pattern of mild LVH. Systolic function was normal. The estimated ejection fraction was in the range of 55% to 60%. Wall motion was normal; there were no regional wall motion abnormalities. Doppler parameters are consistent with high ventricular filling pressure. - Aortic valve: There was mild regurgitation.  FOLLOW UP PLANS AND APPOINTMENTS Allergies  Allergen Reactions  . Dilaudid [Hydromorphone Hcl] Other (See Comments)    "jerking"  . Morphine And Related Other (See Comments)    "jerking"  . Sulfa Antibiotics Other (See Comments)    unknown     Medication List    STOP taking these medications        aspirin 81 MG tablet      TAKE these medications        amLODipine 5 MG tablet  Commonly known as:  NORVASC  Take 1 tablet (5 mg total) by mouth daily.     apixaban 2.5 MG Tabs tablet  Commonly known  as:  ELIQUIS  Take 1 tablet (2.5 mg total) by mouth 2 (two) times daily.     atorvastatin 20 MG tablet  Commonly known as:  LIPITOR  Take 1 tablet (20 mg total) by mouth daily at 6 PM.     Butalbital-APAP-Caffeine 50-300-40 MG Caps  Take 1 capsule by mouth as needed.     CALCIUM 600/VITAMIN D 600-400 MG-UNIT per tablet  Generic drug:  Calcium Carbonate-Vitamin D  Take 1 tablet by mouth daily.     hydroxypropyl methylcellulose / hypromellose 2.5 % ophthalmic solution  Commonly known as:  ISOPTO TEARS / GONIOVISC  Place 2 drops into both eyes daily.     isosorbide mononitrate 30 MG 24 hr tablet  Commonly known as:  IMDUR  TAKE 1 TABLET BY MOUTH EVERY DAY     nebivolol 5 MG tablet  Commonly known as:  BYSTOLIC  Take 1 tablet (5 mg total) by mouth at bedtime.     nitroGLYCERIN 0.4 MG SL tablet  Commonly known as:  NITROSTAT  Place 1 tablet (0.4 mg total) under the tongue every 5 (five) minutes x 3 doses  as needed for chest pain.     PARoxetine 30 MG tablet  Commonly known as:  PAXIL  Take 30 mg by mouth every evening.     tiZANidine 2 MG tablet  Commonly known as:  ZANAFLEX  Take 2 mg by mouth 2 (two) times daily.     valsartan-hydrochlorothiazide 320-25 MG per tablet  Commonly known as:  DIOVAN-HCT  Take 1 tablet by mouth daily.         Follow-up Information    Follow up with Lorretta Harp, MD.   Specialty:  Cardiology   Why:  The office will call   Contact information:   [address] [place] [address] [phone]       BRING ALL MEDICATIONS WITH YOU TO FOLLOW UP APPOINTMENTS  Time spent with patient to include physician time: 36 min Signed: Rosaria Ferries, PA-C 05/24/2014, 8:45 AM Co-Sign MD

## 2014-05-24 NOTE — Discharge Instructions (Signed)
PLEASE REMEMBER TO BRING ALL OF YOUR MEDICATIONS TO EACH OF YOUR FOLLOW-UP OFFICE VISITS. ° °PLEASE ATTEND ALL SCHEDULED FOLLOW-UP APPOINTMENTS.  ° °Activity: Increase activity slowly as tolerated. You may shower, but no soaking baths (or swimming) for 1 week. No driving for 2 days. No lifting over 5 lbs for 1 week. No sexual activity for 1 week.  ° °You May Return to Work: in 1 week (if applicable) ° °Wound Care: You may wash cath site gently with soap and water. Keep cath site clean and dry. If you notice pain, swelling, bleeding or pus at your cath site, please call [phone]. ° ° ° °Cardiac Cath Site Care °Refer to this sheet in the next few weeks. These instructions provide you with information on caring for yourself after your procedure. Your caregiver may also give you more specific instructions. Your treatment has been planned according to current medical practices, but problems sometimes occur. Call your caregiver if you have any problems or questions after your procedure. °HOME CARE INSTRUCTIONS °· You may shower 24 hours after the procedure. Remove the bandage (dressing) and gently wash the site with plain soap and water. Gently pat the site dry.  °· Do not apply powder or lotion to the site.  °· Do not sit in a bathtub, swimming pool, or whirlpool for 5 to 7 days.  °· No bending, squatting, or lifting anything over 10 pounds (4.5 kg) as directed by your caregiver.  °· Inspect the site at least twice daily.  °· Do not drive home if you are discharged the same day of the procedure. Have someone else drive you.  °· You may drive 24 hours after the procedure unless otherwise instructed by your caregiver.  °What to expect: °· Any bruising will usually fade within 1 to 2 weeks.  °· Blood that collects in the tissue (hematoma) may be painful to the touch. It should usually decrease in size and tenderness within 1 to 2 weeks.  °SEEK IMMEDIATE MEDICAL CARE IF: °· You have unusual pain at the site or down the  affected limb.  °· You have redness, warmth, swelling, or pain at the site.  °· You have drainage (other than a small amount of blood on the dressing).  °· You have chills.  °· You have a fever or persistent symptoms for more than 72 hours.  °· You have a fever and your symptoms suddenly get worse.  °· Your leg becomes pale, cool, tingly, or numb.  °· You have heavy bleeding from the site. Hold pressure on the site.  °Document Released: 07/11/2010 Document Revised: 05/28/2011 Document Reviewed: 07/11/2010 °ExitCare® Patient Information ©2012 [employer]. ° °

## 2014-05-24 NOTE — Plan of Care (Signed)
Problem: Consults Goal: Cardiac Cath Patient Education (See Patient Education module for education specifics.) Outcome: Completed/Met Date Met:  05/24/14 Goal: Skin Care Protocol Initiated - if Braden Score 18 or less If consults are not indicated, leave blank or document N/A Outcome: Completed/Met Date Met:  05/24/14  Problem: Phase I Progression Outcomes Goal: Pain controlled with appropriate interventions Outcome: Completed/Met Date Met:  05/24/14 Goal: Voiding-avoid urinary catheter unless indicated Outcome: Completed/Met Date Met:  05/24/14 Goal: Hemodynamically stable Outcome: Completed/Met Date Met:  05/24/14 Goal: Distal pulses equal to baseline Outcome: Completed/Met Date Met:  05/24/14 Goal: Vascular site scale level 0 - I Vascular Site Scale Level 0: No bruising/bleeding/hematoma Level I (Mild): Bruising/Ecchymosis, minimal bleeding/ooozing, palpable hematoma < 3 cm Level II (Moderate): Bleeding not affecting hemodynamic parameters, pseudoaneurysm, palpable hematoma > 3 cm Level III (Severe) Bleeding which affects hemodynamic parameters or retroperitoneal hemorrhage  Outcome: Completed/Met Date Met:  05/24/14 Goal: Post Cath/PCI return to appropriate Path Outcome: Completed/Met Date Met:  05/24/14

## 2014-05-24 NOTE — Progress Notes (Signed)
    Subjective:  Denies CP or dyspnea   Objective:  Filed Vitals:   05/23/14 2100 05/23/14 2332 05/24/14 0059 05/24/14 0524  BP: 177/40 156/66  176/54  Pulse: 74 74  70  Temp: 98.2 F (36.8 C)   97.7 F (36.5 C)  TempSrc: Oral   Oral  Resp: 18 18  20   Height:      Weight:   127 lb 3.3 oz (57.7 kg)   SpO2: 97% 96%  96%    Intake/Output from previous day:  Intake/Output Summary (Last 24 hours) at 05/24/14 0743 Last data filed at 05/23/14 2107  Gross per 24 hour  Intake 1056.65 ml  Output    700 ml  Net 356.65 ml    Physical Exam: Physical exam: Well-developed well-nourished in no acute distress.  Skin is warm and dry.  HEENT is normal.  Neck is supple.  Chest is clear to auscultation with normal expansion.  Cardiovascular exam is regular rate and rhythm.  Abdominal exam nontender or distended. No masses palpated. Extremities show no edema. Radial cath site with no hematoma neuro grossly intact    Lab Results: Basic Metabolic Panel:  Recent Labs  05/22/14 1213  NA 137  K 3.5*  CL 95*  CO2 27  GLUCOSE 97  BUN 15  CREATININE 0.86  CALCIUM 9.5  MG 1.7   CBC:  Recent Labs  05/22/14 1213 05/23/14 0830 05/24/14 0455  WBC 4.7 4.4 6.0  NEUTROABS 3.3  --   --   HGB 11.0* 10.7* 11.3*  HCT 32.9* 31.4* 34.6*  MCV 86.1 88.2 86.9  PLT 120* 107* 133*   Cardiac Enzymes:  Recent Labs  05/22/14 1213 05/22/14 1830 05/22/14 2354  TROPONINI <0.30 <0.30 <0.30     Assessment/Plan:  1. Chest discomfort-atypical.  2. Status post CABG in 2003 by Dr. Roxy Manns 3. Status post mitral valve repair in 2003 by Dr. Roxy Manns 4. Ischemic cardiomyopathy with prior ejection fraction in October 2013 of 25-35%. She underwent a biventricular upgrade revision to her permanent pacemaker in September 2014 with subsequent improvement of ventricular function to an ejection fraction of 50-55% 5. Episodes of paroxysmal atrial fibrillation noted on pacemaker interrogation. 6.  Past history of anemia 7. Memory disorder  8. History of depression on Paxil 9. History of migraine headache. 10. History of sleep apnea. Not using her CPAP machine because she states it makes her migraines worse.  Plan: Cath results noted; plan medical therapy as outlined. Continue beta blocker and statin. DC ASA long term given need for anticoagulation. Resume apixaban at preadmission dose. Add norvasc 5 mg daily for hypertension. DC today and FU with Dr Claiborne Billings in 4-6 weeks > 30 min PA and physician time D2  Kirk Ruths 05/24/2014, 7:43 AM

## 2014-05-24 NOTE — Telephone Encounter (Signed)
Closed encounter °

## 2014-05-29 ENCOUNTER — Telehealth: Payer: Self-pay | Admitting: Cardiovascular Disease

## 2014-05-29 NOTE — Telephone Encounter (Signed)
Mr. Mcmannis is concerned that when [name] had her pacemaker put in Dr. Jens Som took her off of her Crestor and Zetia a year ago Last June . Wants to know should she be back on the medication . They are confused with getting different opinions and want ot know what Dr. Claiborne Billings thinks . Please call    Thanks

## 2014-05-29 NOTE — Telephone Encounter (Signed)
I tried to call this pt. But no answer, and there was no way to leave a message

## 2014-05-31 ENCOUNTER — Encounter (HOSPITAL_COMMUNITY): Payer: Self-pay | Admitting: Cardiovascular Disease

## 2014-06-01 DIAGNOSIS — I10 Essential (primary) hypertension: Secondary | ICD-10-CM | POA: Diagnosis not present

## 2014-06-01 DIAGNOSIS — I2510 Atherosclerotic heart disease of native coronary artery without angina pectoris: Secondary | ICD-10-CM | POA: Diagnosis not present

## 2014-06-01 DIAGNOSIS — F329 Major depressive disorder, single episode, unspecified: Secondary | ICD-10-CM | POA: Diagnosis not present

## 2014-06-19 ENCOUNTER — Ambulatory Visit: Payer: Medicare Other | Admitting: Cardiovascular Disease

## 2014-06-19 ENCOUNTER — Ambulatory Visit: Payer: Medicare Other | Admitting: Cardiology

## 2014-07-09 ENCOUNTER — Ambulatory Visit (INDEPENDENT_AMBULATORY_CARE_PROVIDER_SITE_OTHER): Payer: Medicare Other | Admitting: *Deleted

## 2014-07-09 DIAGNOSIS — I442 Atrioventricular block, complete: Secondary | ICD-10-CM | POA: Diagnosis not present

## 2014-07-09 LAB — MDC_IDC_ENUM_SESS_TYPE_REMOTE
Battery Voltage: 2.98 V
Brady Statistic AP VP Percent: 19 %
Brady Statistic AS VS Percent: 1 %
Brady Statistic RA Percent Paced: 19 %
Date Time Interrogation Session: 20160118070015
Implantable Pulse Generator Model: 3242
Implantable Pulse Generator Serial Number: 2960456
Lead Channel Impedance Value: 430 Ohm
Lead Channel Impedance Value: 930 Ohm
Lead Channel Pacing Threshold Amplitude: 0.5 V
Lead Channel Pacing Threshold Pulse Width: 0.4 ms
Lead Channel Pacing Threshold Pulse Width: 1 ms
Lead Channel Sensing Intrinsic Amplitude: 12 mV
Lead Channel Sensing Intrinsic Amplitude: 2.1 mV
Lead Channel Setting Pacing Amplitude: 2 V
Lead Channel Setting Pacing Amplitude: 2.5 V
Lead Channel Setting Pacing Amplitude: 3.375
Lead Channel Setting Sensing Sensitivity: 5 mV
MDC IDC MSMT BATTERY REMAINING LONGEVITY: 58 mo
MDC IDC MSMT BATTERY REMAINING PERCENTAGE: 90 %
MDC IDC MSMT LEADCHNL LV PACING THRESHOLD AMPLITUDE: 2.375 V
MDC IDC MSMT LEADCHNL RA PACING THRESHOLD AMPLITUDE: 0.5 V
MDC IDC MSMT LEADCHNL RV IMPEDANCE VALUE: 560 Ohm
MDC IDC MSMT LEADCHNL RV PACING THRESHOLD PULSEWIDTH: 0.5 ms
MDC IDC SET LEADCHNL LV PACING PULSEWIDTH: 1 ms
MDC IDC SET LEADCHNL RV PACING PULSEWIDTH: 0.5 ms
MDC IDC STAT BRADY AP VS PERCENT: 1 %
MDC IDC STAT BRADY AS VP PERCENT: 81 %

## 2014-07-09 NOTE — Progress Notes (Signed)
Remote pacemaker transmission.   

## 2014-08-22 DIAGNOSIS — Z9181 History of falling: Secondary | ICD-10-CM | POA: Diagnosis not present

## 2014-08-22 DIAGNOSIS — T887XXA Unspecified adverse effect of drug or medicament, initial encounter: Secondary | ICD-10-CM | POA: Diagnosis not present

## 2014-08-22 DIAGNOSIS — R6889 Other general symptoms and signs: Secondary | ICD-10-CM | POA: Diagnosis not present

## 2014-08-29 DIAGNOSIS — T887XXS Unspecified adverse effect of drug or medicament, sequela: Secondary | ICD-10-CM | POA: Diagnosis not present

## 2014-09-06 ENCOUNTER — Encounter: Payer: Self-pay | Admitting: Cardiology

## 2014-09-19 ENCOUNTER — Encounter: Payer: Self-pay | Admitting: Internal Medicine

## 2014-09-28 ENCOUNTER — Inpatient Hospital Stay (HOSPITAL_COMMUNITY)
Admission: AD | Admit: 2014-09-28 | Discharge: 2014-09-29 | DRG: 292 | Disposition: A | Discharge status: Home / Self Care | Payer: Medicare Other | Source: Other Acute Inpatient Hospital | Attending: Cardiovascular Disease | Admitting: Cardiovascular Disease

## 2014-09-28 DIAGNOSIS — E785 Hyperlipidemia, unspecified: Secondary | ICD-10-CM | POA: Diagnosis present

## 2014-09-28 DIAGNOSIS — I255 Ischemic cardiomyopathy: Secondary | ICD-10-CM | POA: Diagnosis present

## 2014-09-28 DIAGNOSIS — Z951 Presence of aortocoronary bypass graft: Secondary | ICD-10-CM

## 2014-09-28 DIAGNOSIS — F329 Major depressive disorder, single episode, unspecified: Secondary | ICD-10-CM | POA: Diagnosis present

## 2014-09-28 DIAGNOSIS — E039 Hypothyroidism, unspecified: Secondary | ICD-10-CM | POA: Diagnosis present

## 2014-09-28 DIAGNOSIS — I10 Essential (primary) hypertension: Secondary | ICD-10-CM | POA: Diagnosis present

## 2014-09-28 DIAGNOSIS — Z7901 Long term (current) use of anticoagulants: Secondary | ICD-10-CM

## 2014-09-28 DIAGNOSIS — K219 Gastro-esophageal reflux disease without esophagitis: Secondary | ICD-10-CM | POA: Diagnosis present

## 2014-09-28 DIAGNOSIS — I5043 Acute on chronic combined systolic (congestive) and diastolic (congestive) heart failure: Secondary | ICD-10-CM | POA: Diagnosis present

## 2014-09-28 DIAGNOSIS — I480 Paroxysmal atrial fibrillation: Secondary | ICD-10-CM | POA: Diagnosis present

## 2014-09-28 DIAGNOSIS — G4733 Obstructive sleep apnea (adult) (pediatric): Secondary | ICD-10-CM | POA: Diagnosis present

## 2014-09-28 DIAGNOSIS — Z8673 Personal history of transient ischemic attack (TIA), and cerebral infarction without residual deficits: Secondary | ICD-10-CM | POA: Diagnosis not present

## 2014-09-28 DIAGNOSIS — I5033 Acute on chronic diastolic (congestive) heart failure: Secondary | ICD-10-CM

## 2014-09-28 DIAGNOSIS — Z950 Presence of cardiac pacemaker: Secondary | ICD-10-CM

## 2014-09-28 DIAGNOSIS — I25110 Atherosclerotic heart disease of native coronary artery with unstable angina pectoris: Secondary | ICD-10-CM | POA: Diagnosis present

## 2014-09-28 DIAGNOSIS — I200 Unstable angina: Secondary | ICD-10-CM | POA: Diagnosis not present

## 2014-09-28 DIAGNOSIS — I2510 Atherosclerotic heart disease of native coronary artery without angina pectoris: Secondary | ICD-10-CM | POA: Diagnosis not present

## 2014-09-28 DIAGNOSIS — Z79899 Other long term (current) drug therapy: Secondary | ICD-10-CM

## 2014-09-28 DIAGNOSIS — I4891 Unspecified atrial fibrillation: Secondary | ICD-10-CM | POA: Diagnosis not present

## 2014-09-28 DIAGNOSIS — I249 Acute ischemic heart disease, unspecified: Secondary | ICD-10-CM | POA: Diagnosis not present

## 2014-09-28 DIAGNOSIS — Z9889 Other specified postprocedural states: Secondary | ICD-10-CM | POA: Diagnosis not present

## 2014-09-28 DIAGNOSIS — R079 Chest pain, unspecified: Secondary | ICD-10-CM | POA: Diagnosis not present

## 2014-09-28 LAB — MRSA PCR SCREENING: MRSA by PCR: NEGATIVE

## 2014-09-28 LAB — TROPONIN I: Troponin I: <0.03 ng/mL (ref <0.031)

## 2014-09-28 MED ORDER — AMLODIPINE BESYLATE 5 MG PO TABS
5.0000 mg | ORAL_TABLET | Freq: Every day | ORAL | Status: DC
  Administered 2014-09-28 – 2014-09-29 (×2): 5 mg via ORAL
  Filled 2014-09-28 (×2): qty 1

## 2014-09-28 MED ORDER — HEPARIN BOLUS VIA INFUSION
3000.0000 [IU] | Freq: Once | INTRAVENOUS | Status: AC
  Administered 2014-09-28: 3000 [IU] via INTRAVENOUS
  Filled 2014-09-28: qty 3000

## 2014-09-28 MED ORDER — FUROSEMIDE 10 MG/ML IJ SOLN
40.0000 mg | Freq: Once | INTRAMUSCULAR | Status: AC
  Administered 2014-09-28: 40 mg via INTRAVENOUS
  Filled 2014-09-28: qty 4

## 2014-09-28 MED ORDER — HYPROMELLOSE (GONIOSCOPIC) 2.5 % OP SOLN: 2.0000 [drp] | Freq: Every day | OPHTHALMIC | Status: DC

## 2014-09-28 MED ORDER — ASPIRIN EC 81 MG PO TBEC
81.0000 mg | DELAYED_RELEASE_TABLET | Freq: Every day | ORAL | Status: DC
Start: 2014-09-28 — End: 2014-09-29
  Administered 2014-09-28 – 2014-09-29 (×2): 81 mg via ORAL
  Filled 2014-09-28 (×2): qty 1

## 2014-09-28 MED ORDER — ACETAMINOPHEN 325 MG PO TABS: 650.0000 mg | ORAL_TABLET | ORAL | Status: DC | PRN

## 2014-09-28 MED ORDER — BUPROPION HCL ER (XL) 150 MG PO TB24
150.0000 mg | ORAL_TABLET | Freq: Every day | ORAL | Status: DC
  Administered 2014-09-28 – 2014-09-29 (×2): 150 mg via ORAL
  Filled 2014-09-28 (×2): qty 1

## 2014-09-28 MED ORDER — ATORVASTATIN CALCIUM 20 MG PO TABS
20.0000 mg | ORAL_TABLET | Freq: Every day | ORAL | Status: DC
  Administered 2014-09-28: 20 mg via ORAL
  Filled 2014-09-28 (×2): qty 1

## 2014-09-28 MED ORDER — POLYVINYL ALCOHOL 1.4 % OP SOLN
2.0000 [drp] | OPHTHALMIC | Status: DC | PRN
  Filled 2014-09-28: qty 15

## 2014-09-28 MED ORDER — HEPARIN (PORCINE) IN NACL 100-0.45 UNIT/ML-% IJ SOLN
700.0000 [IU]/h | INTRAMUSCULAR | Status: DC
  Administered 2014-09-28 (×2): 700 [IU]/h via INTRAVENOUS
  Filled 2014-09-28: qty 250

## 2014-09-28 MED ORDER — PAROXETINE HCL 30 MG PO TABS
30.0000 mg | ORAL_TABLET | Freq: Every evening | ORAL | Status: DC
  Administered 2014-09-28: 30 mg via ORAL
  Filled 2014-09-28 (×2): qty 1

## 2014-09-28 MED ORDER — ONDANSETRON HCL 4 MG/2ML IJ SOLN: 4.0000 mg | Freq: Four times a day (QID) | INTRAMUSCULAR | Status: DC | PRN

## 2014-09-28 MED ORDER — NITROGLYCERIN 0.4 MG SL SUBL: 0.4000 mg | SUBLINGUAL_TABLET | SUBLINGUAL | Status: DC | PRN

## 2014-09-28 MED ORDER — NEBIVOLOL HCL 5 MG PO TABS
5.0000 mg | ORAL_TABLET | Freq: Every day | ORAL | Status: DC
  Administered 2014-09-28: 5 mg via ORAL
  Filled 2014-09-28 (×2): qty 1

## 2014-09-28 MED ORDER — NITROGLYCERIN IN D5W 200-5 MCG/ML-% IV SOLN
2.0000 ug/min | INTRAVENOUS | Status: DC
  Administered 2014-09-28: 16.667 ug/min via INTRAVENOUS
  Filled 2014-09-28: qty 250

## 2014-09-28 NOTE — H&P (Signed)
Cardiologist:  [name] is an 81 y.o. female.   Chief Complaint: Chest Pain, SOB, LEE  HPI:   [name] is a 81 y.o. female with a history of CAD. She has known coronary artery disease with bypass surgery 12 years ago as well as mitral valve repair. She was admitted in Dec 2015.  Her cardiac enzymes were negative for MI at that time. She has a history of nonischemic cardiomyopathy, but an echocardiogram was performed and showed an improvement in her EF to 55% with biventricular pacing.  She underwent Cardiac catheterization on 05/23/14. Her LIMA to LAD and SVG to RI were patent. The circumflex was occluded. Her EF was normal by echo. Dr. Fletcher Anon reviewed the films and felt that there was no significant change in coronary anatomy since her last heart catheterization. Medical therapy was recommended.  Low dose of Lipitor was initiated.  Norasc added.  After the procedure, she had problems with anxiety and possibly paranoia.  Aspirin was stopped since on   apixaban.   She presents today from [facility] with chest tightness(5/10), SOB, LEE.  CKMB is elevated.  CXR showed no active disease.  Her husbad sys the LEE has improved a lot.  The patient currently denies nausea, vomiting, fever, orthopnea, dizziness, PND, cough, congestion, abdominal pain, hematochezia, melena.   Past Medical History  Diagnosis Date  . Chronic systolic heart failure 3/81/8299  . Ischemic cardiomyopathy- s/p CABG 10/11/2012    EF 39%  . Complete heart block 10/11/2012  . Pacemaker-Medtronic 10/11/2012    Updated to BiV  . Stress incontinence     multiple injections of collagen  . Anginal pain   . Hypertension   . Shortness of breath   . GERD (gastroesophageal reflux disease)   . H/O hiatal hernia   . Hyperlipidemia   . Coronary artery disease   . Weakness 10/06/12  . CHF (congestive heart failure)     hx  . OSA on CPAP 10/11/2012    No longer using CPAP due to worsening of Migraines. AHI was  .45/hr at 9 cm water pressure. RDI was 0.4/hr. Patient was unable to reach REM sleep and maintain the supine position at optimal pressure. Patient was able to sleep for prolonged periods of time at 9 cm water pressure without suffering further from respiratory events.  . Hypothyroidism   . Anemia 1940's  . History of blood transfusion     "related to uretheral OR & when 1st child was born"  . Migraine     ~ qd for 11 months now (05/23/2014)  . Depression   . Chronic anticoagulation     apixaban  . Thrombocytopenia     Past Surgical History  Procedure Laterality Date  . Bi-ventricular pacemaker revision (crt-r)  11/21/2012    BIV   UPGRADE    WITH DR Caryl Comes  . Appendectomy    . Incontinence surgery    . Cholecystectomy    . Insert / replace / remove pacemaker  2007  . US echocardiography  04/13/12    EF - 25% to 35% with intraventricular dyssynchrony, RVSP of 46, moderate TR, and mild to moderate AI. She had a mild increased mitral valve gradient of 5 mmHg with minimal MR.   [name] Lexiscan myocardial stress test  08/20/11    The EF = 34%. This is a low risk scan. There is a new fix abnormality in the distal LAD artery territory. Although no reversibility is seen,  high grade obstruction with resting ischemia cannot be excluded.  . Cardiac valve replacement    . Coronary angioplasty with stent placement  2012    No significant CAD, patent but ectatic graft supplying the ramus, patent LIMA-LAD, and an EF of 50% at that time. She had an occluded stent to her circumflex from remote intervention. Required recatheterization 05/2012 by Dr. Claiborne Billings and had a patent LIMA-LAD with distal diffuse disease, patent SVG-OM, 10%-20% mid right coronary but an EF of about 35%, quantitatively it was 38.9.  [name] Cardiac catheterization  05/2012    (1st cath 2012) This required recatheterization by Dr. Claiborne Billings and had a patent LIMA-LAD with distal diffuse disease, patent SVG-OM, 10% to 20% mid right coronary but an EF of  about 35%, quantitatively iw was 38.9.  [name] Cardiac catheterization      "today;s makes 28" (05/23/2014)  . Coronary angioplasty with stent placement      "I have a tota of 4" (05/23/2014)  . Coronary artery bypass graft  2003    With LIMA-LAD and vein graft to OM, and associated mitral valve repair with anuloplasty ring and quadrangular resection of the posterior leaflet, 24 mm Seguin ring by Dr. Roxy Manns.  . Vaginal hysterectomy    . Pacemaker generator change    . Cataract extraction, bilateral Bilateral   . Left heart catheterization with coronary/graft angiogram N/A 05/23/2012    Procedure: LEFT HEART CATHETERIZATION WITH Beatrix Fetters;  Surgeon: Troy Sine, MD;  Location: [facility] CATH LAB;  Service: Cardiovascular;  Laterality: N/A;  . Bi-ventricular pacemaker upgrade N/A 10/31/2012    Procedure: BI-VENTRICULAR PACEMAKER UPGRADE;  Surgeon: Deboraha Sprang, MD;  Location: [facility] CATH LAB;  Service: Cardiovascular;  Laterality: N/A;  . Bi-ventricular pacemaker upgrade N/A 11/21/2012    Procedure: BI-VENTRICULAR PACEMAKER UPGRADE;  Surgeon: Deboraha Sprang, MD;  Location: [facility] CATH LAB;  Service: Cardiovascular;  Laterality: N/A;  . Left heart catheterization with coronary angiogram N/A 06/07/2013    Procedure: LEFT HEART CATHETERIZATION WITH CORONARY ANGIOGRAM;  Surgeon: Lorretta Harp, MD;  Location: [facility] CATH LAB;  Service: Cardiovascular;  Laterality: N/A;  . Left heart catheterization with coronary/graft angiogram N/A 05/23/2014    Procedure: LEFT HEART CATHETERIZATION WITH Beatrix Fetters;  Surgeon: Wellington Hampshire, MD;  Location: [facility] CATH LAB;  Service: Cardiovascular;  Laterality: N/A;    Family History  Problem Relation Age of Onset  . Cancer Father    Social History:  reports that she has never smoked. She has never used smokeless tobacco. She reports that she does not drink alcohol or use illicit drugs.  Allergies:  Allergies  Allergen Reactions  . Dilaudid [Hydromorphone  Hcl] Other (See Comments)    "jerking"  . Morphine And Related Other (See Comments)    "jerking"  . Sulfa Antibiotics Other (See Comments)    unknown    Medications Prior to Admission  Medication Sig Dispense Refill  . amLODipine (NORVASC) 5 MG tablet Take 1 tablet (5 mg total) by mouth daily. 30 tablet 11  . atorvastatin (LIPITOR) 20 MG tablet Take 1 tablet (20 mg total) by mouth daily at 6 PM. 30 tablet 11  . buPROPion (WELLBUTRIN XL) 150 MG 24 hr tablet Take 150 mg by mouth daily.    . ergocalciferol (VITAMIN D2) 50000 UNITS capsule Take 50,000 Units by mouth once a week.    . nebivolol (BYSTOLIC) 5 MG tablet Take 1 tablet (5 mg total) by mouth at bedtime. 30 tablet 6  .  nitroGLYCERIN (NITROSTAT) 0.4 MG SL tablet Place 1 tablet (0.4 mg total) under the tongue every 5 (five) minutes x 3 doses as needed for chest pain. 25 tablet 2  . PARoxetine (PAXIL) 30 MG tablet Take 30 mg by mouth every evening.    . valsartan-hydrochlorothiazide (DIOVAN-HCT) 320-25 MG per tablet Take 1 tablet by mouth daily. 90 tablet 3  . apixaban (ELIQUIS) 2.5 MG TABS tablet Take 1 tablet (2.5 mg total) by mouth 2 (two) times daily. 60 tablet 10  . Butalbital-APAP-Caffeine 50-300-40 MG CAPS Take 1 capsule by mouth as needed.    . Calcium Carbonate-Vitamin D (CALCIUM 600/VITAMIN D) 600-400 MG-UNIT per tablet Take 1 tablet by mouth daily.    . hydroxypropyl methylcellulose (ISOPTO TEARS) 2.5 % ophthalmic solution Place 2 drops into both eyes daily.    . isosorbide mononitrate (IMDUR) 30 MG 24 hr tablet TAKE 1 TABLET BY MOUTH EVERY DAY 30 tablet 6  . tiZANidine (ZANAFLEX) 2 MG tablet Take 2 mg by mouth 2 (two) times daily.  2    No results found for this or any previous visit (from the past 48 hour(s)). No results found.  Review of Systems  Constitutional: Negative for fever and diaphoresis.  HENT: Negative for congestion and sore throat.   Respiratory: Positive for shortness of breath. Negative for cough.     Cardiovascular: Positive for chest pain and leg swelling. Negative for orthopnea and PND.  Gastrointestinal: Negative for nausea, vomiting, abdominal pain, blood in stool and melena.  Genitourinary: Negative for hematuria.  Musculoskeletal: Negative for myalgias.  Neurological: Positive for dizziness (upon standing).  All other systems reviewed and are negative.   Blood pressure 140/58, pulse 73, resp. rate 20, SpO2 97 %. Physical Exam  Nursing note and vitals reviewed. Constitutional: She is oriented to person, place, and time. She appears well-developed and well-nourished. No distress.  HENT:  Head: Normocephalic and atraumatic.  Mouth/Throat: No oropharyngeal exudate.  Eyes: EOM are normal. Pupils are equal, round, and reactive to light. No scleral icterus.  Neck: Normal range of motion. Neck supple. JVD present.  Cardiovascular: Normal rate, regular rhythm, S1 normal and S2 normal.   No murmur heard. Pulses:      Radial pulses are 2+ on the right side, and 2+ on the left side.       Dorsalis pedis pulses are 2+ on the right side, and 2+ on the left side.  Respiratory: Effort normal and breath sounds normal. She has no wheezes. She has no rales.  GI: Bowel sounds are normal. She exhibits distension. There is no tenderness.  Musculoskeletal: She exhibits edema.  1+ LEE  Neurological: She is alert and oriented to person, place, and time. She exhibits normal muscle tone.  Skin: Skin is warm and dry.  Psychiatric: She has a normal mood and affect.     Assessment/Plan Principal Problem:   Unstable angina Active Problems:   Ischemic cardiomyopathy   Acute on Chronic diastolic heart failure   Pacemaker-Medtronic-CRT   OSA (obstructive sleep apnea)   CAD (coronary artery disease)   Hyperlipidemia with target LDL less than 70   History of mitral valve repair   Paroxysmal atrial fibrillation   Hypertension   Chronic anticoagulation - apixaban  Plan:   Admit with unstable  angina and a on c diastolic heart failure.  We will start IV heparin and IV NTG.  Stop Eliquis for now.  40mg  IV lasix x 1 and reassess in AM.  Strict I/O.  Cycle  troponin.  AM BMET.  H/H 11.0/32.9.WBC 4.8 PLT 151.  DDimer negative.  Will decide on further WU in the morning.  Hold Divan/HCT with IV NTG and lasix.  Tarri Fuller, [place].  09/28/2014, 8:01 PM  The patient was seen, examined and discussed with Tarri Fuller, PA-C and I agree with the above.   82 year old female with known CAD, s/p CABG and MVR 12 years ago, h/o ischemic cardiomyopathy, s/p BiV upgrade and improvement of LVEF to 55% on the last echo. The patient presented with chest pain that started 3 hours ago, however the patient doesn't remember details when and how it started. She also complains of LE edema and some SOB. She was seen at the [facility] ER where troponin was negative, but CKMB and myoglobin were positive and she was transferred to [facility] for NSTEMI. She was started on heparin and NTG drip as she has ongoing pain. She was on epixaban at home for paroxysmal a-fib. The patient was admitted for a similar episode in December 2015, her cath showed significant three-vessel coronary artery disease with patent native RCA, SVG to ramus and LIMA to LAD. No significant change in coronary anatomy since most recent cardiac catheterization. Normal left ventricular end-diastolic pressure. Normal ejection fraction by echo. Recommendations: The chest pain is likely noncardiac. Continue medical therapy. She has mild fluid overload, we will give lasix 40 mg iv x 1 and reevaluate in the morning. If negative enzymes, we will try to add antianginal therapy (ranexa) and treat for CHF only. No plan for cath unless significant elevation in troponin.  Dorothy Spark 09/28/2014

## 2014-09-28 NOTE — Progress Notes (Signed)
ANTICOAGULATION CONSULT NOTE - Initial Consult  Pharmacy Consult for heparin Indication: chest pain/ACS  Allergies  Allergen Reactions  . Dilaudid [Hydromorphone Hcl] Other (See Comments)    "jerking"  . Morphine And Related Other (See Comments)    "jerking"  . Sulfa Antibiotics Other (See Comments)    unknown    Patient Measurements:   Heparin Dosing Weight: 57.7 kg  Vital Signs: Temp: 98.3 F (36.8 C) (04/08 1945) Temp Source: Oral (04/08 1945) BP: 140/58 mmHg (04/08 1945) Pulse Rate: 73 (04/08 1945)  Labs: No results for input(s): HGB, HCT, PLT, APTT, LABPROT, INR, HEPARINUNFRC, CREATININE, CKTOTAL, CKMB, TROPONINI in the last 72 hours.  CrCl cannot be calculated (Unknown ideal weight.).   Medical History: Past Medical History  Diagnosis Date  . Chronic systolic heart failure 10/13/5359  . Ischemic cardiomyopathy- s/p CABG 10/11/2012    EF 39%  . Complete heart block 10/11/2012  . Pacemaker-Medtronic 10/11/2012    Updated to BiV  . Stress incontinence     multiple injections of collagen  . Anginal pain   . Hypertension   . Shortness of breath   . GERD (gastroesophageal reflux disease)   . H/O hiatal hernia   . Hyperlipidemia   . Coronary artery disease   . Weakness 10/06/12  . CHF (congestive heart failure)     hx  . OSA on CPAP 10/11/2012    No longer using CPAP due to worsening of Migraines. AHI was .45/hr at 9 cm water pressure. RDI was 0.4/hr. Patient was unable to reach REM sleep and maintain the supine position at optimal pressure. Patient was able to sleep for prolonged periods of time at 9 cm water pressure without suffering further from respiratory events.  . Hypothyroidism   . Anemia 1940's  . History of blood transfusion     "related to uretheral OR & when 1st child was born"  . Migraine     ~ qd for 11 months now (05/23/2014)  . Depression   . Chronic anticoagulation     apixaban  . Thrombocytopenia     Medications:  Prescriptions prior to  admission  Medication Sig Dispense Refill Last Dose  . apixaban (ELIQUIS) 2.5 MG TABS tablet Take 1 tablet (2.5 mg total) by mouth 2 (two) times daily. 60 tablet 10 09/28/2014 at Unknown time  . atorvastatin (LIPITOR) 20 MG tablet Take 1 tablet (20 mg total) by mouth daily at 6 PM. 30 tablet 11   . buPROPion (WELLBUTRIN XL) 150 MG 24 hr tablet Take 150 mg by mouth daily.     . ergocalciferol (VITAMIN D2) 50000 UNITS capsule Take 50,000 Units by mouth once a week.     . nebivolol (BYSTOLIC) 5 MG tablet Take 1 tablet (5 mg total) by mouth at bedtime. 30 tablet 6 05/21/2014 at Unknown time  . nitroGLYCERIN (NITROSTAT) 0.4 MG SL tablet Place 1 tablet (0.4 mg total) under the tongue every 5 (five) minutes x 3 doses as needed for chest pain. 25 tablet 2 09/28/2014 at Unknown time  . PARoxetine (PAXIL) 30 MG tablet Take 30 mg by mouth every evening.   05/21/2014 at Unknown time  . valsartan-hydrochlorothiazide (DIOVAN-HCT) 320-25 MG per tablet Take 1 tablet by mouth daily. 90 tablet 3 05/21/2014 at Unknown time  . amLODipine (NORVASC) 5 MG tablet Take 1 tablet (5 mg total) by mouth daily. 30 tablet 11   . Butalbital-APAP-Caffeine 50-300-40 MG CAPS Take 1 capsule by mouth as needed.   Past Month at Unknown  time  . Calcium Carbonate-Vitamin D (CALCIUM 600/VITAMIN D) 600-400 MG-UNIT per tablet Take 1 tablet by mouth daily.   05/21/2014 at Unknown time  . hydroxypropyl methylcellulose (ISOPTO TEARS) 2.5 % ophthalmic solution Place 2 drops into both eyes daily.   05/21/2014 at Unknown time  . isosorbide mononitrate (IMDUR) 30 MG 24 hr tablet TAKE 1 TABLET BY MOUTH EVERY DAY 30 tablet 6 05/21/2014 at Unknown time  . tiZANidine (ZANAFLEX) 2 MG tablet Take 2 mg by mouth 2 (two) times daily.  2 as needed    Assessment: 81 yo female admitted with chest pain, initiating heparin for ACS. Pt is on Eliquis PTA, her last dose taken was this morning. Discussed with cardiology, will continue with heparin tonight despite  Eliquis.  Will need to use aPTTs until Eliquis wash out.    Goal of Therapy:  Heparin level 0.3-0.7 units/ml Monitor platelets by anticoagulation protocol: Yes aPTT 66-102   Plan:  Heparin bolus 3000 units x1 Heparin 650 units/hr Daily HL, aPTT, CBC    Hughes Better, PharmD, BCPS Clinical Pharmacist Pager: [phone] 09/28/2014 8:33 PM

## 2014-09-29 DIAGNOSIS — E785 Hyperlipidemia, unspecified: Secondary | ICD-10-CM

## 2014-09-29 DIAGNOSIS — Z9889 Other specified postprocedural states: Secondary | ICD-10-CM

## 2014-09-29 DIAGNOSIS — I10 Essential (primary) hypertension: Secondary | ICD-10-CM

## 2014-09-29 LAB — PROTIME-INR
INR: 1.17 (ref 0.00–1.49)
Prothrombin Time: 15.1 seconds (ref 11.6–15.2)

## 2014-09-29 LAB — CBC
HCT: 34.4 % — ABNORMAL LOW (ref 36.0–46.0)
HEMOGLOBIN: 11.4 g/dL — AB (ref 12.0–15.0)
MCH: 29.1 pg (ref 26.0–34.0)
MCHC: 33.1 g/dL (ref 30.0–36.0)
MCV: 87.8 fL (ref 78.0–100.0)
Platelets: 175 10*3/uL (ref 150–400)
RBC: 3.92 MIL/uL (ref 3.87–5.11)
RDW: 14.3 % (ref 11.5–15.5)
WBC: 5.4 10*3/uL (ref 4.0–10.5)

## 2014-09-29 LAB — HEPARIN LEVEL (UNFRACTIONATED): Heparin Unfractionated: 1.58 IU/mL — ABNORMAL HIGH (ref 0.30–0.70)

## 2014-09-29 LAB — BASIC METABOLIC PANEL
Anion gap: 13 (ref 5–15)
BUN: 18 mg/dL (ref 6–23)
CO2: 24 mmol/L (ref 19–32)
CREATININE: 1.19 mg/dL — AB (ref 0.50–1.10)
Calcium: 9.3 mg/dL (ref 8.4–10.5)
Chloride: 99 mmol/L (ref 96–112)
GFR calc Af Amer: 48 mL/min — ABNORMAL LOW (ref >90)
GFR calc non Af Amer: 41 mL/min — ABNORMAL LOW (ref >90)
Glucose, Bld: 117 mg/dL — ABNORMAL HIGH (ref 70–99)
POTASSIUM: 3.8 mmol/L (ref 3.5–5.1)
Sodium: 136 mmol/L (ref 135–145)

## 2014-09-29 LAB — TROPONIN I: Troponin I: <0.03 ng/mL (ref <0.031)

## 2014-09-29 LAB — APTT
APTT: 74 s — AB (ref 24–37)
APTT: 59 s — AB (ref 24–37)

## 2014-09-29 MED ORDER — FUROSEMIDE 20 MG PO TABS: 20.0000 mg | ORAL_TABLET | Freq: Every day | ORAL | Status: DC

## 2014-09-29 MED ORDER — NEBIVOLOL HCL 5 MG PO TABS: 5.0000 mg | ORAL_TABLET | Freq: Every day | ORAL | Status: DC

## 2014-09-29 MED ORDER — AMLODIPINE BESYLATE 5 MG PO TABS: 5.0000 mg | ORAL_TABLET | Freq: Every day | ORAL | Status: DC

## 2014-09-29 MED ORDER — POTASSIUM CHLORIDE ER 10 MEQ PO TBCR: 10.0000 meq | EXTENDED_RELEASE_TABLET | Freq: Every day | ORAL | Status: DC

## 2014-09-29 NOTE — Discharge Instructions (Signed)
Weigh daily and call [phone] if:  Weight is up 3 lbs in one day, increased swelling or increased shortness of breath.

## 2014-09-29 NOTE — Progress Notes (Signed)
ANTICOAGULATION CONSULT NOTE - Follow Up Consult  Pharmacy Consult for heparin Indication: atrial fibrillation and USAP   Labs:  Recent Labs  09/28/14 2142 09/29/14 0247 09/29/14 0256  APTT  --   --  74*  HEPARINUNFRC  --  1.58*  --   TROPONINI <0.03 <0.03  --     Assessment/Plan:  81yo female therapeutic on heparin using PTT with initial dosing while Eliquis on hold. Will continue gtt at current rate and confirm stable with additional PTT.   Wynona Neat, PharmD, BCPS  09/29/2014,5:06 AM

## 2014-09-29 NOTE — Discharge Summary (Signed)
Discharge Summary   Patient ID: [name], MRN: [id number], DOB/AGE: 1930/12/22 81 y.o.  Admit date: 09/28/2014 Discharge date: 09/29/2014   Primary Care Physician:  [place] [facility]   Primary Cardiologist:  Dr. Shelva Majestic  Primary Electrophysiologist:  Dr. Virl Axe   Reason for Admission:  Chest Pain   Primary Discharge Diagnoses:  Principal Problem:   Acute on chronic diastolic heart failure Active Problems:   Unstable angina   Ischemic cardiomyopathy   Pacemaker-Medtronic-CRT   OSA (obstructive sleep apnea)   CAD (coronary artery disease)   Hyperlipidemia with target LDL less than 70   History of mitral valve repair   Paroxysmal atrial fibrillation   Hypertension   Chronic anticoagulation - apixaban     Wt Readings from Last 3 Encounters:  09/28/14 130 lb 8.2 oz (59.2 kg)  05/24/14 127 lb 3.3 oz (57.7 kg)  04/24/14 126 lb (57.153 kg)    Secondary Discharge Diagnoses:   Past Medical History  Diagnosis Date  . Chronic systolic heart failure 2/42/6834  . Ischemic cardiomyopathy- s/p CABG 10/11/2012    EF 39%  . Complete heart block 10/11/2012  . Pacemaker-Medtronic 10/11/2012    Updated to BiV  . Stress incontinence     multiple injections of collagen  . Anginal pain   . Hypertension   . Shortness of breath   . GERD (gastroesophageal reflux disease)   . H/O hiatal hernia   . Hyperlipidemia   . Coronary artery disease   . Weakness 10/06/12  . CHF (congestive heart failure)     hx  . OSA on CPAP 10/11/2012    No longer using CPAP due to worsening of Migraines. AHI was .45/hr at 9 cm water pressure. RDI was 0.4/hr. Patient was unable to reach REM sleep and maintain the supine position at optimal pressure. Patient was able to sleep for prolonged periods of time at 9 cm water pressure without suffering further from respiratory events.  . Hypothyroidism   . Anemia 1940's  . History of blood transfusion     "related to uretheral OR & when 1st child was  born"  . Migraine     ~ qd for 11 months now (05/23/2014)  . Depression   . Chronic anticoagulation     apixaban  . Thrombocytopenia       Allergies:    Allergies  Allergen Reactions  . Dilaudid [Hydromorphone Hcl] Other (See Comments)    "jerking"  . Morphine And Related Other (See Comments)    "jerking"  . Sulfa Antibiotics Other (See Comments)    unknown      Procedures Performed This Admission:   None   Hospital Course:  [name] is a 81 y.o. female with a history of CAD status post CABG in 2004, status post mitral valve repair, nonischemic cardiomyopathy with a prior EF of 25-35%, symptomatic bradycardia status post PPM in 2007, status post upgrade to CRT in 2014, OSA on CPAP, PAF.  Ejection fraction has improved over time. Most recent echo in May 2015 demonstrated EF 55-60%. She was admitted in December 2015 with chest pain. Cardiac enzymes remained normal. Cardiac catheterization demonstrated patent LIMA-LAD, patent SVG-RI. Circumflex was occluded. Echocardiogram demonstrated continued normal LV function.   She presented to [facility] on the date of admission with chest pain. CK-MB was elevated but troponin was negative in the emergency room at [facility]. She was started on heparin and nitroglycerin drip for suspected non-STEMI. She was noted to be mildly  volume overloaded (increased LE edema and dyspnea) and given IV Lasix 1.  Cardiac enzymes remained normal. She was evaluated by Dr. Meda Coffee this morning. She was improved and felt stable for discharge to home. It was recommended that the patient be discharged on a small dose of Lasix at 20 mg a day along with potassium 10 mEq once daily. Given her recent catheterization in December 2015 with stable findings, no further ischemic evaluation was felt to be necessary. She will need close follow-up in one week. If she continues to have chest discomfort, consider adjustment of antianginal therapy (Imdur versus Ranexa). She  will need a follow-up basic metabolic panel at follow-up. Diovan was held during this admission.  BP soft at DC.  Resuming angiotensin receptor blocker can be considered at FU.     Discharge Vitals:   Blood pressure 124/52, pulse 78, temperature 97.9 F (36.6 C), temperature source Oral, resp. rate 14, height 5\' 2"  (1.575 m), weight 130 lb 8.2 oz (59.2 kg), SpO2 98 %.   Labs:   Recent Labs  09/29/14 1050  WBC 5.4  HGB 11.4*  HCT 34.4*  MCV 87.8  PLT 175     Recent Labs  09/29/14 1050  NA 136  K 3.8  CL 99  CO2 24  BUN 18  CREATININE 1.19*  CALCIUM 9.3     Recent Labs  09/28/14 2142 09/29/14 0247 09/29/14 1050  TROPONINI <0.03 <0.03 <0.03     Recent Labs  09/29/14 1050  INR 1.17     Diagnostic Procedures and Studies:  No results found.    Disposition:   Pt is being discharged home today in good condition.  Follow-up Plans & Appointments      Follow-up Information    Follow up with KELLY,THOMAS A, MD In 1 week.   Specialty:  Cardiology   Why:  office will call you for a follow up in 1 week;  you will need a lab test (BMET) that day   Contact information:   [address] [place] [address] [phone]       Follow up with Myrlene Broker, MD.   Specialty:  Family Medicine   Why:  As needed   Contact information:   [place] [address]       Discharge Medications    Medication List    STOP taking these medications        valsartan 320 MG tablet  Commonly known as:  DIOVAN      TAKE these medications        amLODipine 5 MG tablet  Commonly known as:  NORVASC  Take 1 tablet (5 mg total) by mouth daily.     apixaban 2.5 MG Tabs tablet  Commonly known as:  ELIQUIS  Take 1 tablet (2.5 mg total) by mouth 2 (two) times daily.     atorvastatin 20 MG tablet  Commonly known as:  LIPITOR  Take 1 tablet (20 mg total) by mouth daily at 6 PM.     buPROPion 150 MG 24 hr tablet  Commonly known  as:  WELLBUTRIN XL  Take 150 mg by mouth at bedtime.     CALCIUM 600/VITAMIN D 600-400 MG-UNIT per tablet  Generic drug:  Calcium Carbonate-Vitamin D  Take 1 tablet by mouth daily.     ergocalciferol 50000 UNITS capsule  Commonly known as:  VITAMIN D2  Take 50,000 Units by mouth once a week.     furosemide 20  MG tablet  Commonly known as:  LASIX  Take 1 tablet (20 mg total) by mouth daily.     nebivolol 5 MG tablet  Commonly known as:  BYSTOLIC  Take 1 tablet (5 mg total) by mouth at bedtime.     nitroGLYCERIN 0.4 MG SL tablet  Commonly known as:  NITROSTAT  Place 1 tablet (0.4 mg total) under the tongue every 5 (five) minutes x 3 doses as needed for chest pain.     PARoxetine 30 MG tablet  Commonly known as:  PAXIL  Take 30 mg by mouth every evening.     potassium chloride 10 MEQ tablet  Commonly known as:  K-DUR  Take 1 tablet (10 mEq total) by mouth daily.     Turmeric Curcumin Caps  Take 1 capsule by mouth as needed (extra strength for migraines).         Outstanding Labs/Studies  1. BMET at FU office visit.  Duration of Discharge Encounter: Greater than 30 minutes including physician and PA time.  Signed, Richardson Dopp, PA-C   09/29/2014 4:30 PM

## 2014-09-29 NOTE — Progress Notes (Signed)
Patient Name: [name] Date of Encounter: 09/29/2014  Principal Problem:   Unstable angina Active Problems:   Ischemic cardiomyopathy   Chronic diastolic heart failure   Pacemaker-Medtronic-CRT   OSA (obstructive sleep apnea)   CAD (coronary artery disease)   Hyperlipidemia with target LDL less than 70   History of mitral valve repair   Paroxysmal atrial fibrillation   Hypertension   Chronic anticoagulation - apixaban   Length of Stay: 1  SUBJECTIVE  The patient feels much better today, SOB has improved, she slept well the last night.   CURRENT MEDS . amLODipine  5 mg Oral Daily  . aspirin EC  81 mg Oral Daily  . atorvastatin  20 mg Oral QHS  . buPROPion  150 mg Oral Daily  . nebivolol  5 mg Oral QHS  . PARoxetine  30 mg Oral QPM    OBJECTIVE  Filed Vitals:   09/29/14 0900 09/29/14 1000 09/29/14 1100 09/29/14 1200  BP: 120/56  115/59 125/60  Pulse: 77 73 79 83  Temp: 97.5 F (36.4 C)   97.9 F (36.6 C)  TempSrc: Oral   Oral  Resp: 20 15 20 22   Height:      Weight:      SpO2: 95% 97% 97% 100%    Intake/Output Summary (Last 24 hours) at 09/29/14 1406 Last data filed at 09/29/14 1100  Gross per 24 hour  Intake 160.38 ml  Output   1775 ml  Net -1614.62 ml   Filed Weights   09/28/14 1913  Weight: 130 lb 8.2 oz (59.2 kg)    PHYSICAL EXAM  General: Pleasant, NAD. Neuro: Alert and oriented X 3. Moves all extremities spontaneously. Psych: Normal affect. HEENT:  Normal  Neck: Supple without bruits or JVD. Lungs:  Resp regular and unlabored, CTA. Heart: RRR no s3, s4, or murmurs. Abdomen: Soft, non-tender, non-distended, BS + x 4.  Extremities: No clubbing, cyanosis or edema. DP/PT/Radials 2+ and equal bilaterally.  Accessory Clinical Findings  CBC  Recent Labs  09/29/14 1050  WBC 5.4  HGB 11.4*  HCT 34.4*  MCV 87.8  PLT 568   Basic Metabolic Panel  Recent Labs  09/29/14 1050  NA 136  K 3.8  CL 99  CO2 24  GLUCOSE 117*  BUN  18  CREATININE 1.19*  CALCIUM 9.3   Cardiac Enzymes  Recent Labs  09/28/14 2142 09/29/14 0247 09/29/14 1050  TROPONINI <0.03 <0.03 <0.03   TELE: paced rhythm   ASSESSMENT AND PLAN  Acute diastolic CHF CAD Hypertension Hyperlipidemia  81 year old female with known CAD, s/p CABG and MVR 12 years ago, h/o ischemic cardiomyopathy, s/p BiV upgrade and improvement of LVEF to 55% on the last echo. The patient presented with chest pain that started 3 hours ago, however the patient doesn't remember details when and how it started. She also complains of LE edema and some SOB. She was seen at the [facility] ER where troponin was negative, but CKMB and myoglobin were positive and she was transferred to [facility] for NSTEMI. She was started on heparin and NTG drip as she has ongoing pain. She was on epixaban at home for paroxysmal a-fib.  On admission she was mildly fluid overloaded and responded very well to Lasix with symptoms improvement. I would discharge home on small dose of lasix 20 mg po daily and KCL 10 mEq daily.  Troponin negative x 3, I would discontinue heparin, restart home dose of Eliquis.  No plan  for further ischemic work up at this point as she had a similar episode in December 2015, her cath showed significant three-vessel coronary artery disease with patent native RCA, SVG to ramus and LIMA to LAD. No significant change in coronary anatomy since most recent cardiac catheterization.   She will need to be followed in the clinic within 1 week, Dr Claiborne Billings is her cardiologist, if not available, please schedule with PA. Recheck labs, if she continues to have chest pain consider antianginals such as imdur or Ranexa.    Signed, Dorothy Spark MD, [facility] 09/29/2014

## 2014-09-29 NOTE — Progress Notes (Signed)
Called Cardiology and requested diet for pt if no procedure planned. Cardiac diet given. Pt is resting with no other complaints.

## 2014-10-01 ENCOUNTER — Telehealth: Payer: Self-pay | Admitting: Cardiovascular Disease

## 2014-10-01 NOTE — Telephone Encounter (Signed)
Left message for pt to call.

## 2014-10-01 NOTE — Telephone Encounter (Signed)
New message         Pt TOC appt is [name] 4/22 with Richardson Dopp at 12:10pm

## 2014-10-02 NOTE — Telephone Encounter (Signed)
Spoke to husband Patient contacted regarding discharge from [facility] on 09/30/14.  Patient understands to follow up with provider [name] on 10/12/14 at 12 :10 pm at [address]. Patient understands discharge instructions? Yes- Patient understands medications and regiment? yes Patient understands to bring all medications to this visit? yes   Patient 's husband states the only new medication was furosemide. He states patient's ankles were swollen yesterday he state he had her elevate her legs. He is states he has not seem then today. RN asked if she has been doing daily weight since post hospital . Husband states no ,will buy one later today. Husband also states  He would prefer the patient to be seen by her primary cardiologist [name], if possible since they did not see him during last hospitalization.RN informed husband due to the patient's diagnosis an office appointment is needed in certain period of time. Patient will have a followup visit with [name] after 10/12/14. Husband voiced understanding.

## 2014-10-02 NOTE — Telephone Encounter (Signed)
Left message to keep appointment  On 10/12/14 Will place on waitlist for DR KELLY TO BE SEEN PRIOR 10/12/14

## 2014-10-02 NOTE — Telephone Encounter (Signed)
Discussed with Trixie Dredge

## 2014-10-08 ENCOUNTER — Ambulatory Visit (INDEPENDENT_AMBULATORY_CARE_PROVIDER_SITE_OTHER): Payer: Medicare Other | Admitting: *Deleted

## 2014-10-08 ENCOUNTER — Encounter: Payer: Self-pay | Admitting: Internal Medicine

## 2014-10-08 DIAGNOSIS — I255 Ischemic cardiomyopathy: Secondary | ICD-10-CM | POA: Diagnosis not present

## 2014-10-08 DIAGNOSIS — I5033 Acute on chronic diastolic (congestive) heart failure: Secondary | ICD-10-CM | POA: Diagnosis not present

## 2014-10-08 DIAGNOSIS — I5032 Chronic diastolic (congestive) heart failure: Secondary | ICD-10-CM

## 2014-10-08 LAB — MDC_IDC_ENUM_SESS_TYPE_REMOTE
Battery Remaining Percentage: 90 %
Battery Voltage: 2.98 V
Brady Statistic AP VP Percent: 19 %
Brady Statistic AP VS Percent: 1 %
Brady Statistic AS VP Percent: 81 %
Brady Statistic AS VS Percent: 1 %
Brady Statistic RA Percent Paced: 19 %
Date Time Interrogation Session: 20160418121135
Implantable Pulse Generator Model: 3242
Implantable Pulse Generator Serial Number: 2960456
Lead Channel Impedance Value: 440 Ohm
Lead Channel Impedance Value: 590 Ohm
Lead Channel Impedance Value: 940 Ohm
Lead Channel Pacing Threshold Amplitude: 0.5 V
Lead Channel Pacing Threshold Amplitude: 0.5 V
Lead Channel Pacing Threshold Amplitude: 2.5 V
Lead Channel Pacing Threshold Pulse Width: 0.4 ms
Lead Channel Pacing Threshold Pulse Width: 0.5 ms
Lead Channel Sensing Intrinsic Amplitude: 12 mV
Lead Channel Setting Pacing Amplitude: 2 V
Lead Channel Setting Pacing Amplitude: 2.5 V
Lead Channel Setting Pacing Amplitude: 3.5 V
Lead Channel Setting Sensing Sensitivity: 5 mV
MDC IDC MSMT BATTERY REMAINING LONGEVITY: 58 mo
MDC IDC MSMT LEADCHNL LV PACING THRESHOLD PULSEWIDTH: 1 ms
MDC IDC MSMT LEADCHNL RA SENSING INTR AMPL: 2 mV
MDC IDC SET LEADCHNL LV PACING PULSEWIDTH: 1 ms
MDC IDC SET LEADCHNL RV PACING PULSEWIDTH: 0.5 ms

## 2014-10-08 NOTE — Progress Notes (Signed)
Remote pacemaker transmission.   

## 2014-10-12 ENCOUNTER — Other Ambulatory Visit (INDEPENDENT_AMBULATORY_CARE_PROVIDER_SITE_OTHER): Payer: Medicare Other | Admitting: *Deleted

## 2014-10-12 ENCOUNTER — Encounter: Payer: Self-pay | Admitting: Physician Assistant

## 2014-10-12 ENCOUNTER — Ambulatory Visit (INDEPENDENT_AMBULATORY_CARE_PROVIDER_SITE_OTHER): Payer: Medicare Other | Admitting: Physician Assistant

## 2014-10-12 VITALS — BP 118/68 | HR 72 | Wt 126.0 lb

## 2014-10-12 DIAGNOSIS — I5032 Chronic diastolic (congestive) heart failure: Secondary | ICD-10-CM | POA: Diagnosis not present

## 2014-10-12 DIAGNOSIS — I480 Paroxysmal atrial fibrillation: Secondary | ICD-10-CM | POA: Diagnosis not present

## 2014-10-12 DIAGNOSIS — I2510 Atherosclerotic heart disease of native coronary artery without angina pectoris: Secondary | ICD-10-CM

## 2014-10-12 DIAGNOSIS — Z950 Presence of cardiac pacemaker: Secondary | ICD-10-CM

## 2014-10-12 DIAGNOSIS — I255 Ischemic cardiomyopathy: Secondary | ICD-10-CM | POA: Diagnosis not present

## 2014-10-12 DIAGNOSIS — I10 Essential (primary) hypertension: Secondary | ICD-10-CM | POA: Diagnosis not present

## 2014-10-12 DIAGNOSIS — E785 Hyperlipidemia, unspecified: Secondary | ICD-10-CM

## 2014-10-12 LAB — BASIC METABOLIC PANEL
BUN: 22 mg/dL (ref 6–23)
CHLORIDE: 96 meq/L (ref 96–112)
CO2: 34 meq/L — AB (ref 19–32)
CREATININE: 1.3 mg/dL — AB (ref 0.40–1.20)
Calcium: 9.8 mg/dL (ref 8.4–10.5)
GFR: 41.67 mL/min — ABNORMAL LOW (ref >60.00)
Glucose, Bld: 128 mg/dL — ABNORMAL HIGH (ref 70–99)
Potassium: 4.1 mEq/L (ref 3.5–5.1)
SODIUM: 135 meq/L (ref 135–145)

## 2014-10-12 MED ORDER — VALSARTAN 80 MG PO TABS: 80.0000 mg | ORAL_TABLET | Freq: Every day | ORAL | Status: DC

## 2014-10-12 NOTE — Patient Instructions (Addendum)
Medication Instructions:   STOP AMLODIPINE   CHANGE YOUR POTASSIUM AND LASIX (FUROSEMIDE) TO EVERY OTHER DAY   START DIOVAN 80 MG ONE BY MOUTH DAILY   Labwork: BMET  BMET IN 1 WEEK  Testing/Procedures: NONE  Follow-Up: Your physician recommends that you schedule a follow-up appointment in: 6-8 WEEKS WITH DR Claiborne Billings    Any Other Special Instructions Will Be Listed Below   Check your blood pressure daily and call or send by my chart in 1-2 weeks  Weigh yourself daily every morning first thing. Call the office if you weight goes up 3 pounds overnight or 5 pounds in one week

## 2014-10-12 NOTE — Addendum Note (Signed)
Addended by: Eulis Foster on: 10/12/2014 01:06 PM   Modules accepted: Orders

## 2014-10-12 NOTE — Progress Notes (Signed)
Cardiology Office Note   Date:  10/12/2014   ID:  [name], DOB [birth year], MRN [id number]  PCP:  Myrlene Broker, MD  Cardiologist:  Dr. Shelva Majestic   Electrophysiologist:  Dr. Virl Erickson   Chief Complaint  Patient presents with  . Congestive Heart Failure     History of Present Illness: [name] is a 81 y.o. female with a hx of CAD status post CABG in 2004, status post mitral valve repair, ischemic cardiomyopathy with a prior EF of 25-35%, symptomatic bradycardia status post PPM in 2007, status post upgrade to CRT in 2014, OSA on CPAP, PAF. Ejection fraction has improved over time. Most recent echo in May 2015 demonstrated EF 55-60%. She was admitted in December 2015 with chest pain. Cardiac enzymes remained normal. Cardiac catheterization demonstrated patent LIMA-LAD, patent SVG-RI. Circumflex was occluded. Echocardiogram demonstrated continued normal LV function.  Admitted 4/8-4/9 with chest pain in the setting of a/c diastolic HF.  She was transferred from [facility].  CKMB there was elevated. However, Troponins remained negative.  She improved with one dose of IV lasix.  Diovan was held 2/2 to low BP.  This can be resumed at FU.  She was sent home on low dose Lasix.  She returns for FU.    She arrived 45 minutes late today, but was at the [facility] office by mistake.  Since DC, she has been doing well. No weight changes.  She denies any chest pain.  No change in SOB.  She is NYHA 2-2b.  She denies orthopnea, PND.  LE edema is stable. No syncope. She has gotten dizzy a few times since DC.   Studies/Reports Reviewed Today:  LHC 05/23/14 LM:  Normal in size with 80% distal stenosis. LAD:  Normal in size with 90% ostial stenosis.  LCx:  Small in size and occluded in the midsegment. RI:  Occluded proximal stent. RCA:  20% stenosis in the midsegment.  SVG to ramus: Patent with minor irregularities. LIMA to LAD: Patent with no significant disease.  Echo  05/22/14 - Mild LVH. EF 55% to 60%.  Wall motion was normal.  Doppler parameters are consistent with high ventricular filling pressure. - Aortic valve: There was mild regurgitation.  Myoview 08/20/11 Ant, ant-septal, apical-septal and apical scar, EF 34%; low risk    Past Medical History  Diagnosis Date  . Chronic systolic heart failure 0/93/2355  . Ischemic cardiomyopathy- s/p CABG 10/11/2012    EF 39%  . Complete heart block 10/11/2012  . Pacemaker-Medtronic 10/11/2012    Updated to BiV  . Stress incontinence     multiple injections of collagen  . Anginal pain   . Hypertension   . Shortness of breath   . GERD (gastroesophageal reflux disease)   . H/O hiatal hernia   . Hyperlipidemia   . Coronary artery disease   . Weakness 10/06/12  . CHF (congestive heart failure)     hx  . OSA on CPAP 10/11/2012    No longer using CPAP due to worsening of Migraines. AHI was .45/hr at 9 cm water pressure. RDI was 0.4/hr. Patient was unable to reach REM sleep and maintain the supine position at optimal pressure. Patient was able to sleep for prolonged periods of time at 9 cm water pressure without suffering further from respiratory events.  . Hypothyroidism   . Anemia 1940's  . History of blood transfusion     "related to uretheral OR & when 1st child was born"  .  Migraine     ~ qd for 11 months now (05/23/2014)  . Depression   . Chronic anticoagulation     apixaban  . Thrombocytopenia     Past Surgical History  Procedure Laterality Date  . Bi-ventricular pacemaker revision (crt-r)  11/21/2012    BIV   UPGRADE    WITH DR Caryl Comes  . Appendectomy    . Incontinence surgery    . Cholecystectomy    . Insert / replace / remove pacemaker  2007  . US echocardiography  04/13/12    EF - 25% to 35% with intraventricular dyssynchrony, RVSP of 46, moderate TR, and mild to moderate AI. She had a mild increased mitral valve gradient of 5 mmHg with minimal MR.   [name] Lexiscan myocardial stress test  08/20/11      The EF = 34%. This is a low risk scan. There is a new fix abnormality in the distal LAD artery territory. Although no reversibility is seen, high grade obstruction with resting ischemia cannot be excluded.  . Cardiac valve replacement    . Coronary angioplasty with stent placement  2012    No significant CAD, patent but ectatic graft supplying the ramus, patent LIMA-LAD, and an EF of 50% at that time. She had an occluded stent to her circumflex from remote intervention. Required recatheterization 05/2012 by Dr. Claiborne Billings and had a patent LIMA-LAD with distal diffuse disease, patent SVG-OM, 10%-20% mid right coronary but an EF of about 35%, quantitatively it was 38.9.  [name] Cardiac catheterization  05/2012    (1st cath 2012) This required recatheterization by Dr. Claiborne Billings and had a patent LIMA-LAD with distal diffuse disease, patent SVG-OM, 10% to 20% mid right coronary but an EF of about 35%, quantitatively iw was 38.9.  [name] Cardiac catheterization      "today;s makes 28" (05/23/2014)  . Coronary angioplasty with stent placement      "I have a tota of 4" (05/23/2014)  . Coronary artery bypass graft  2003    With LIMA-LAD and vein graft to OM, and associated mitral valve repair with anuloplasty ring and quadrangular resection of the posterior leaflet, 24 mm Seguin ring by Dr. Roxy Manns.  . Vaginal hysterectomy    . Pacemaker generator change    . Cataract extraction, bilateral Bilateral   . Left heart catheterization with coronary/graft angiogram N/A 05/23/2012    Procedure: LEFT HEART CATHETERIZATION WITH Beatrix Fetters;  Surgeon: Troy Sine, MD;  Location: [facility] CATH LAB;  Service: Cardiovascular;  Laterality: N/A;  . Bi-ventricular pacemaker upgrade N/A 10/31/2012    Procedure: BI-VENTRICULAR PACEMAKER UPGRADE;  Surgeon: Deboraha Sprang, MD;  Location: [facility] CATH LAB;  Service: Cardiovascular;  Laterality: N/A;  . Bi-ventricular pacemaker upgrade N/A 11/21/2012    Procedure: BI-VENTRICULAR PACEMAKER  UPGRADE;  Surgeon: Deboraha Sprang, MD;  Location: [facility] CATH LAB;  Service: Cardiovascular;  Laterality: N/A;  . Left heart catheterization with coronary angiogram N/A 06/07/2013    Procedure: LEFT HEART CATHETERIZATION WITH CORONARY ANGIOGRAM;  Surgeon: Lorretta Harp, MD;  Location: [facility] CATH LAB;  Service: Cardiovascular;  Laterality: N/A;  . Left heart catheterization with coronary/graft angiogram N/A 05/23/2014    Procedure: LEFT HEART CATHETERIZATION WITH Beatrix Fetters;  Surgeon: Wellington Hampshire, MD;  Location: [facility] CATH LAB;  Service: Cardiovascular;  Laterality: N/A;     Current Outpatient Prescriptions  Medication Sig Dispense Refill  . apixaban (ELIQUIS) 2.5 MG TABS tablet Take 1 tablet (2.5 mg total) by mouth 2 (two) times  daily. 60 tablet 10  . atorvastatin (LIPITOR) 20 MG tablet Take 1 tablet (20 mg total) by mouth daily at 6 PM. 30 tablet 11  . buPROPion (WELLBUTRIN XL) 150 MG 24 hr tablet Take 150 mg by mouth at bedtime.     . Calcium Carbonate-Vitamin D (CALCIUM 600/VITAMIN D) 600-400 MG-UNIT per tablet Take 1 tablet by mouth daily.    . ergocalciferol (VITAMIN D2) 50000 UNITS capsule Take 50,000 Units by mouth once a week.    . furosemide (LASIX) 20 MG tablet Take 20 mg by mouth as directed. Every other day    . Misc Natural Products (TURMERIC CURCUMIN) CAPS Take 1 capsule by mouth as needed (extra strength for migraines).    . nebivolol (BYSTOLIC) 5 MG tablet Take 1 tablet (5 mg total) by mouth at bedtime. 30 tablet 5  . nitroGLYCERIN (NITROSTAT) 0.4 MG SL tablet Place 1 tablet (0.4 mg total) under the tongue every 5 (five) minutes x 3 doses as needed for chest pain. 25 tablet 2  . PARoxetine (PAXIL) 30 MG tablet Take 30 mg by mouth every evening.    . potassium chloride (K-DUR) 10 MEQ tablet Take 10 mEq by mouth as directed. Every other day    . valsartan (DIOVAN) 80 MG tablet Take 1 tablet (80 mg total) by mouth daily. 30 tablet 5   No current facility-administered  medications for this visit.    Allergies:   Dilaudid; Morphine and related; and Sulfa antibiotics    Social History:  The patient  reports that she has never smoked. She has never used smokeless tobacco. She reports that she does not drink alcohol or use illicit drugs.   Family History:  The patient's family history includes Cancer in her father.    ROS:   Please see the history of present illness.   Review of Systems  Constitution: Positive for diaphoresis and malaise/fatigue.  HENT: Positive for headaches.   Cardiovascular: Positive for chest pain, dyspnea on exertion and leg swelling.  Respiratory: Positive for snoring.   Hematologic/Lymphatic: Bruises/bleeds easily.  Neurological: Positive for dizziness.  Psychiatric/Behavioral: Positive for depression. The patient is nervous/anxious.   All other systems reviewed and are negative.     PHYSICAL EXAM: VS:  BP 118/68 mmHg  Pulse 72  Wt 126 lb (57.153 kg)    Wt Readings from Last 3 Encounters:  10/12/14 126 lb (57.153 kg)  09/28/14 130 lb 8.2 oz (59.2 kg)  05/24/14 127 lb 3.3 oz (57.7 kg)     GEN: Well nourished, well developed, in no acute distress HEENT: normal Neck: no JVD, no masses Cardiac:  Normal S1/S2, RRR; no murmur ,  no rubs or gallops, no edema  Respiratory:  clear to auscultation bilaterally, no wheezing, rhonchi or rales. GI: soft, nontender, nondistended, + BS MS: no deformity or atrophy Skin: warm and dry  Neuro:  CNs II-XII intact, Strength and sensation are intact Psych: Normal affect   EKG:  EKG is ordered today.  It demonstrates:   A sensed, V paced HR 72   Recent Labs: 05/22/2014: ALT 17; Magnesium 1.7; Pro B Natriuretic peptide (BNP) 657.6*; TSH 2.780 09/29/2014: BUN 18; Creatinine 1.19*; Hemoglobin 11.4*; Platelets 175; Potassium 3.8; Sodium 136    Lipid Panel    Component Value Date/Time   CHOL 192 05/23/2014 0830   TRIG 173* 05/23/2014 0830   HDL 39* 05/23/2014 0830   CHOLHDL 4.9  05/23/2014 0830   VLDL 35 05/23/2014 0830   LDLCALC 118* 05/23/2014  0830      ASSESSMENT AND PLAN:  Chronic diastolic CHF (congestive heart failure) Volume is stable.  She has been dizzy at times since DC.  Symptoms sound like they are related to diuresis.  I will change her Lasix and K+ to every other day.  Weigh daily and call if:  Weight up 3 lbs in one day, increased swelling or increased dyspnea.  Check BMET today.    Ischemic cardiomyopathy EF improved to normal by last echo in 05/2014.  Continue beta-blocker.  I will try to get her back on angiotensin receptor blocker by stopping her Amlodipine (BP remains low and she has been dizzy) and starting Diovan 80 mg QD (previous dose was 320 mg).  Check BMET in 1 week.  Coronary artery disease involving native coronary artery of native heart without angina pectoris No angina.  She is not on ASA as she is on Eliquis.  Continue statin, beta-blocker. Resume angiotensin receptor blocker as noted.   Essential hypertension Adjust medications as noted.   PAF (paroxysmal atrial fibrillation) Maintaining NSR.  Continue Eliquis.  CHADS2-VASc=8.   Hyperlipidemia Continue statin.  Pacemaker-Medtronic-CRT  FU with EP as planned.    Current medicines are reviewed at length with the patient today.  Concerns regarding medicines are as outlined above.  The following changes have been made:    DC Amlodipine  Decrease Lasix and K+ to every other day  Start Diovan 80 mg QD   Labs/ tests ordered today include:  Orders Placed This Encounter  Procedures  . Basic metabolic panel  . Basic metabolic panel  . EKG 12-Lead    Disposition:   FU with Dr. Shelva Majestic 6-8 weeks.   Signed, Versie Starks, MHS 10/12/2014 2:23 PM    [facility] [place], [place], [place]  [address] Phone: [phone]; Fax: [phone]

## 2014-10-15 ENCOUNTER — Other Ambulatory Visit: Payer: Self-pay | Admitting: *Deleted

## 2014-10-15 DIAGNOSIS — I10 Essential (primary) hypertension: Secondary | ICD-10-CM

## 2014-10-17 ENCOUNTER — Telehealth: Payer: Self-pay | Admitting: Physician Assistant

## 2014-10-17 NOTE — Telephone Encounter (Signed)
New Message       Pt's husband calling wanting to get pt's labs done in [place] at [facility] and needs to know if the order needs to be faxed to the hospital and if so, could we do that for them. Also stating that pt thought she had some Vitamin D2 left at home but states that she doesn't and that Richardson Dopp still wanted her to be taking it but she will now need a new rx sent to her pharmacy. Please call back and advise.

## 2014-10-18 NOTE — Telephone Encounter (Signed)
Follow up     Please call and let pt know if she can go to [facility] in [place] to have her labs drawn.  She is due to come here Monday 10-22-14.  Fax order to [phone].  Please call and let pt know order has been faxed

## 2014-10-18 NOTE — Telephone Encounter (Signed)
Pt notified that is ok to get her lab work done at [facility] Monday 10/22/14 BMET. I will fax order over to [phone]. Pt said thank you.

## 2014-10-22 ENCOUNTER — Other Ambulatory Visit: Payer: Medicare Other

## 2014-10-22 ENCOUNTER — Telehealth: Payer: Self-pay

## 2014-10-22 ENCOUNTER — Telehealth: Payer: Self-pay | Admitting: Cardiovascular Disease

## 2014-10-22 NOTE — Telephone Encounter (Signed)
Patient's husband called to get a rx for vitamin d2 I asked Dr Caryl Comes Aboutit since Nicki Reaper was not here and he said to get it from PCP . Patient verbally understood

## 2014-10-22 NOTE — Telephone Encounter (Signed)
New Message  Pt husband- following up about doing Lab work in [place]. Please call back and discuss.

## 2014-10-25 NOTE — Telephone Encounter (Signed)
Returned a call to patient & husband. Apologized for late call. They have already solved the issue. They were just confirming that the lab has received lab order.

## 2014-10-27 ENCOUNTER — Telehealth: Payer: Self-pay | Admitting: Internal Medicine

## 2014-10-27 NOTE — Telephone Encounter (Signed)
I received a call from Mr. [name] about [name]. Her husband noticed the patient had developed leg swelling tonight. They have not been keeping track of her weights.  Unfortunately, patient has run out of Lasix but endorses no breathing difficulties. Informed the husband that it safe to wait to pick up the Lasix tomorrow morning. Certainly, he was asked to bring Mrs. [name] to the hospital should there be any change in her clinical status.

## 2014-10-30 ENCOUNTER — Encounter: Payer: Self-pay | Admitting: Physician Assistant

## 2014-10-30 DIAGNOSIS — L821 Other seborrheic keratosis: Secondary | ICD-10-CM | POA: Diagnosis not present

## 2014-10-30 DIAGNOSIS — I5032 Chronic diastolic (congestive) heart failure: Secondary | ICD-10-CM | POA: Diagnosis not present

## 2014-10-30 DIAGNOSIS — L578 Other skin changes due to chronic exposure to nonionizing radiation: Secondary | ICD-10-CM | POA: Diagnosis not present

## 2014-10-30 DIAGNOSIS — I10 Essential (primary) hypertension: Secondary | ICD-10-CM | POA: Diagnosis not present

## 2014-10-31 ENCOUNTER — Telehealth: Payer: Self-pay | Admitting: *Deleted

## 2014-10-31 NOTE — Telephone Encounter (Signed)
Pt notified of lab results by with verbal understanding by phone

## 2014-11-04 NOTE — Telephone Encounter (Signed)
agree

## 2014-11-09 ENCOUNTER — Encounter: Payer: Self-pay | Admitting: Cardiology

## 2014-11-29 ENCOUNTER — Ambulatory Visit (INDEPENDENT_AMBULATORY_CARE_PROVIDER_SITE_OTHER): Payer: Medicare Other | Admitting: Cardiovascular Disease

## 2014-11-29 VITALS — BP 90/52 | HR 89 | Ht 62.0 in | Wt 127.4 lb

## 2014-11-29 DIAGNOSIS — R5383 Other fatigue: Secondary | ICD-10-CM | POA: Diagnosis not present

## 2014-11-29 DIAGNOSIS — Z7901 Long term (current) use of anticoagulants: Secondary | ICD-10-CM

## 2014-11-29 DIAGNOSIS — E785 Hyperlipidemia, unspecified: Secondary | ICD-10-CM | POA: Diagnosis not present

## 2014-11-29 DIAGNOSIS — I5033 Acute on chronic diastolic (congestive) heart failure: Secondary | ICD-10-CM

## 2014-11-29 DIAGNOSIS — I255 Ischemic cardiomyopathy: Secondary | ICD-10-CM

## 2014-11-29 DIAGNOSIS — Z9889 Other specified postprocedural states: Secondary | ICD-10-CM

## 2014-11-29 DIAGNOSIS — I25810 Atherosclerosis of coronary artery bypass graft(s) without angina pectoris: Secondary | ICD-10-CM | POA: Diagnosis not present

## 2014-11-29 DIAGNOSIS — I480 Paroxysmal atrial fibrillation: Secondary | ICD-10-CM

## 2014-11-29 DIAGNOSIS — G4733 Obstructive sleep apnea (adult) (pediatric): Secondary | ICD-10-CM

## 2014-11-29 DIAGNOSIS — Z8669 Personal history of other diseases of the nervous system and sense organs: Secondary | ICD-10-CM

## 2014-11-29 MED ORDER — VALSARTAN 80 MG PO TABS: 40.0000 mg | ORAL_TABLET | Freq: Two times a day (BID) | ORAL | Status: DC

## 2014-11-29 NOTE — Patient Instructions (Signed)
Your physician has recommended you make the following change in your medication: take the furosemide and potassium only as needed. Change the diovan to 1/2 tablet twice a day.  Your physician recommends that you return for lab work in: [place] fasting.  Your physician recommends that you schedule a follow-up appointment in: 3 months.

## 2014-12-02 ENCOUNTER — Encounter: Payer: Self-pay | Admitting: Cardiovascular Disease

## 2014-12-02 NOTE — Progress Notes (Signed)
Patient ID: [name], female   DOB: 1931-01-16, 81 y.o.   MRN: [id number]    HPI: [name] is a 81 y.o. female who presents for 7 month cardiology evaluation.  [name]  has a history of CAD and  had undergone intervention to an intermediate vessel. In 2003 she underwent CABG surgery with a LIMA to the LAD, vein to the intermediate and at that time also had mitral valve repair with angioplasty ring. Cardiac catheterization in December 2013 showed an ejection fraction of 35-40% with  significant native CAD with previously noted 80-85% distal left main stenosis, 70% ostial stenosis of her LAD proximal to the previously placed stent, total occlusion of the ramus intermediate vessel at the site of remote stenting. The circumflex was diminutive without stenosis. The RCA was a large dominant vessel with 20% luminal irregularities. She had dyskinetic segment in the distal anterolateral apical region. Initially, the patient had undergone permanent pacemaker insertion in 2007 for symptomatic bradycardia. An echo Doppler study in October 2013 showed reduction in ejection fraction to 25-35% with intraventricular dyssynchrony, moderate pulmonary hypertension with estimated RV systolic pressure 46 mm, moderate TR, and mild to moderate AR. She was referred to Dr. Caryl Comes and in June 2014 had a biventricular upgrade revision. An echo Doppler study on 02/21/2013  Showed significantly improved LV function with an ejection fraction of 50-55%. There was grade 2 diastolic dysfunction. Her septal motion showed paradox. There was mild aortic regurgitation and an  Mitral annuloplasty ring was present. There was mild left atrial dilatation. PA pressure was now 38 mm.  She has obstructive sleep apnea and has used CPAP therapy not consistently.    Since I last saw her, she has been started on anticoagulant therapy with a picks of a.m. After she was found to have a true fibrillation detected on her St. Jude device.  She has  tolerated Eliquis.  She was told I Dr. Caryl Comes to discontinue the aspirin.  She may have suffered a mini stroke on Eliquis in September 2015.  She went to the emergency room in [place].  Her blood pressure was significantly elevated at 950 systolic.  Since I last saw her, she was admitted to [facility] hospital in December 2015 with some chest pain.  Apparently, she underwent repeat catheterization by Dr. Fletcher Anon on 05/23/2014 which revealed native disease with 80% distal left main stenosis, 90% ostial LAD stenosis with competitive flow from a patent LIMA and occluded mid circumflex as well as occluded ramus intermediate stent.  She had widely patent RCA, vein graft to the ramus, and LIMA to the LAD.  There is no severe itching change in her coronary anatomy since her last catheterization.  She was readmitted to [facility] hospital overnight on September 28, 2014  with chest pain in the setting of acute on chronic diastolic CHF.  She was transferred from [facility].  CK-MB was mildly elevated, although troponins were negative.  She improved with 1 dose of Lasix.  Her Diovan was held secondary to low blood pressure.  Since discharge, she had seen Richardson Dopp in the office on 10/12/2014.  I reviewed her hospitalizations and his office note.  She presents for follow-up evaluation.    Past Medical History  Diagnosis Date  . Chronic systolic heart failure 9/32/6712  . Ischemic cardiomyopathy- s/p CABG 10/11/2012    EF 39%  . Complete heart block 10/11/2012  . Pacemaker-Medtronic 10/11/2012    Updated to BiV  . Stress incontinence  multiple injections of collagen  . Anginal pain   . Hypertension   . Shortness of breath   . GERD (gastroesophageal reflux disease)   . H/O hiatal hernia   . Hyperlipidemia   . Coronary artery disease   . Weakness 10/06/12  . CHF (congestive heart failure)     hx  . OSA on CPAP 10/11/2012    No longer using CPAP due to worsening of Migraines. AHI was .45/hr at 9 cm water pressure.  RDI was 0.4/hr. Patient was unable to reach REM sleep and maintain the supine position at optimal pressure. Patient was able to sleep for prolonged periods of time at 9 cm water pressure without suffering further from respiratory events.  . Hypothyroidism   . Anemia 1940's  . History of blood transfusion     "related to uretheral OR & when 1st child was born"  . Migraine     ~ qd for 11 months now (05/23/2014)  . Depression   . Chronic anticoagulation     apixaban  . Thrombocytopenia     Past Surgical History  Procedure Laterality Date  . Bi-ventricular pacemaker revision (crt-r)  11/21/2012    BIV   UPGRADE    WITH DR Caryl Comes  . Appendectomy    . Incontinence surgery    . Cholecystectomy    . Insert / replace / remove pacemaker  2007  . US echocardiography  04/13/12    EF - 25% to 35% with intraventricular dyssynchrony, RVSP of 46, moderate TR, and mild to moderate AI. She had a mild increased mitral valve gradient of 5 mmHg with minimal MR.   [name] Lexiscan myocardial stress test  08/20/11    The EF = 34%. This is a low risk scan. There is a new fix abnormality in the distal LAD artery territory. Although no reversibility is seen, high grade obstruction with resting ischemia cannot be excluded.  . Cardiac valve replacement    . Coronary angioplasty with stent placement  2012    No significant CAD, patent but ectatic graft supplying the ramus, patent LIMA-LAD, and an EF of 50% at that time. She had an occluded stent to her circumflex from remote intervention. Required recatheterization 05/2012 by Dr. Claiborne Billings and had a patent LIMA-LAD with distal diffuse disease, patent SVG-OM, 10%-20% mid right coronary but an EF of about 35%, quantitatively it was 38.9.  [name] Cardiac catheterization  05/2012    (1st cath 2012) This required recatheterization by Dr. Claiborne Billings and had a patent LIMA-LAD with distal diffuse disease, patent SVG-OM, 10% to 20% mid right coronary but an EF of about 35%, quantitatively iw was  38.9.  [name] Cardiac catheterization      "today;s makes 28" (05/23/2014)  . Coronary angioplasty with stent placement      "I have a tota of 4" (05/23/2014)  . Coronary artery bypass graft  2003    With LIMA-LAD and vein graft to OM, and associated mitral valve repair with anuloplasty ring and quadrangular resection of the posterior leaflet, 24 mm Seguin ring by Dr. Roxy Manns.  . Vaginal hysterectomy    . Pacemaker generator change    . Cataract extraction, bilateral Bilateral   . Left heart catheterization with coronary/graft angiogram N/A 05/23/2012    Procedure: LEFT HEART CATHETERIZATION WITH Beatrix Fetters;  Surgeon: Troy Sine, MD;  Location: [facility] CATH LAB;  Service: Cardiovascular;  Laterality: N/A;  . Bi-ventricular pacemaker upgrade N/A 10/31/2012    Procedure: BI-VENTRICULAR PACEMAKER UPGRADE;  Surgeon:  Deboraha Sprang, MD;  Location: [facility] CATH LAB;  Service: Cardiovascular;  Laterality: N/A;  . Bi-ventricular pacemaker upgrade N/A 11/21/2012    Procedure: BI-VENTRICULAR PACEMAKER UPGRADE;  Surgeon: Deboraha Sprang, MD;  Location: [facility] CATH LAB;  Service: Cardiovascular;  Laterality: N/A;  . Left heart catheterization with coronary angiogram N/A 06/07/2013    Procedure: LEFT HEART CATHETERIZATION WITH CORONARY ANGIOGRAM;  Surgeon: Lorretta Harp, MD;  Location: [facility] CATH LAB;  Service: Cardiovascular;  Laterality: N/A;  . Left heart catheterization with coronary/graft angiogram N/A 05/23/2014    Procedure: LEFT HEART CATHETERIZATION WITH Beatrix Fetters;  Surgeon: Wellington Hampshire, MD;  Location: [place] CATH LAB;  Service: Cardiovascular;  Laterality: N/A;    Allergies  Allergen Reactions  . Dilaudid [Hydromorphone Hcl] Other (See Comments)    "jerking"  . Morphine And Related Other (See Comments)    "jerking"  . Sulfa Antibiotics Other (See Comments)    unknown    Current Outpatient Prescriptions  Medication Sig Dispense Refill  . apixaban (ELIQUIS) 2.5 MG TABS tablet Take 1  tablet (2.5 mg total) by mouth 2 (two) times daily. 60 tablet 10  . atorvastatin (LIPITOR) 20 MG tablet Take 1 tablet (20 mg total) by mouth daily at 6 PM. 30 tablet 11  . buPROPion (WELLBUTRIN XL) 150 MG 24 hr tablet Take 150 mg by mouth at bedtime.     . Calcium Carbonate-Vitamin D (CALCIUM 600/VITAMIN D) 600-400 MG-UNIT per tablet Take 1 tablet by mouth daily.    . ergocalciferol (VITAMIN D2) 50000 UNITS capsule Take 50,000 Units by mouth once a week.    . furosemide (LASIX) 20 MG tablet Take 20 mg by mouth as needed.    . Misc Natural Products (TURMERIC CURCUMIN) CAPS Take 1 capsule by mouth as needed (extra strength for migraines).    . nebivolol (BYSTOLIC) 5 MG tablet Take 1 tablet (5 mg total) by mouth at bedtime. 30 tablet 5  . nitroGLYCERIN (NITROSTAT) 0.4 MG SL tablet Place 1 tablet (0.4 mg total) under the tongue every 5 (five) minutes x 3 doses as needed for chest pain. 25 tablet 2  . PARoxetine (PAXIL) 30 MG tablet Take 30 mg by mouth every evening.    . potassium chloride (K-DUR) 10 MEQ tablet Take 10 mEq by mouth as needed.    . valsartan (DIOVAN) 80 MG tablet Take 0.5 tablets (40 mg total) by mouth 2 (two) times daily. 30 tablet 5   No current facility-administered medications for this visit.    History   Social History  . Marital Status: Married    Spouse Name: N/A  . Number of Children: N/A  . Years of Education: N/A   Occupational History  . Not on file.   Social History Main Topics  . Smoking status: Never Smoker   . Smokeless tobacco: Never Used  . Alcohol Use: No  . Drug Use: No  . Sexual Activity: Not Currently   Other Topics Concern  . Not on file   Social History Narrative   socially she is married has 3 children and 9 grandchildren.  Family History  Problem Relation Age of Onset  . Cancer Father    ROS General: Negative; No fevers, chills, or night sweats;  HEENT: Negative; No changes in vision or hearing, sinus congestion, difficulty  swallowing Pulmonary: Negative; No cough, wheezing, shortness of breath, hemoptysis Cardiovascular: See history of present illness GI: Negative; No nausea, vomiting, diarrhea, or abdominal pain GU: Positive for chronic bladder  incontinence Musculoskeletal: Negative; no myalgias, joint pain, or weakness Hematologic/Oncology: Negative; no easy bruising, bleeding Endocrine: Negative; no heat/cold intolerance; no diabetes Neuro: strip.  Migraine headaches.  Question mini stroke not documented in September Skin: Negative; No rashes or skin lesions Psychiatric: Negative; No behavioral problems, depression Sleep: positive for complex sleep apnea; no daytime sleepiness, hypersomnolence, bruxism, restless legs, hypnogognic hallucinations, no cataplexy Other comprehensive 14 point system review is negative.   PE BP 90/52 mmHg  Pulse 89  Ht $R'5\' 2"'Gw$  (1.575 m)  Wt 57.788 kg (127 lb 6.4 oz)  BMI 23.30 kg/m2  Repeat blood pressure 120/70 supine; 90/60 standing General: Alert, oriented, no distress.  Skin: normal turgor, no rashes; warm and dry HEENT: Normocephalic, atraumatic. Pupils round and reactive; sclera anicteric;no lid lag.  Nose without nasal septal hypertrophy Mouth/Parynx benign; Mallinpatti scale 3 Neck: No JVD, no carotid bruits with normal carotid upstroke Lungs: clear to ausculatation and percussion; no wheezing or rales Heart: RRR, s1 s2 normal 1- 2/6 systolic murmur; no diastolic murmur, rubs, thrills or heaves. Abdomen: soft, nontender; no hepatosplenomehaly, BS+; abdominal aorta nontender and not dilated by palpation. Pulses 2+ Extremities: no clubbing cyanosis or edema, Homan's sign negative  Neurologic: grossly nonfocal Psychologic: normal affect and mood.  ECG (independently read by me): Atrial sensing and 100% ventricular pacing.  Biventricular pacemaker.  Ventricular rate 89 bpm.  November 2015 ECG (independently read by me): atrial sensing, ventricular pacing at 79  bpm  April 2015 ECG (independently read by me): Appropriate atrial sensing and pacing with 100% ventricular pacing  LABS: BMP Latest Ref Rng 10/12/2014 09/29/2014 05/22/2014  Glucose 70 - 99 mg/dL 128(H) 117(H) 97  BUN 6 - 23 mg/dL $Remove'22 18 15  'ZUCITkd$ Creatinine 0.40 - 1.20 mg/dL 1.30(H) 1.19(H) 0.86  Sodium 135 - 145 mEq/L 135 136 137  Potassium 3.5 - 5.1 mEq/L 4.1 3.8 3.5(L)  Chloride 96 - 112 mEq/L 96 99 95(L)  CO2 19 - 32 mEq/L 34(H) 24 27  Calcium 8.4 - 10.5 mg/dL 9.8 9.3 9.5   Hepatic Function Latest Ref Rng 05/22/2014 06/07/2013 10/10/2010  Total Protein 6.0 - 8.3 g/dL 6.6 6.0 6.3  Albumin 3.5 - 5.2 g/dL 3.9 3.5 3.7  AST 0 - 37 U/L 21 17 68(H)  ALT 0 - 35 U/L 17 11 100(H)  Alk Phosphatase 39 - 117 U/L 54 58 218(H)  Total Bilirubin 0.3 - 1.2 mg/dL 0.5 0.2(L) 1.0  Bilirubin, Direct 0.0 - 0.3 mg/dL - - 0.2   CBC Latest Ref Rng 09/29/2014 05/24/2014 05/23/2014  WBC 4.0 - 10.5 K/uL 5.4 6.0 4.4  Hemoglobin 12.0 - 15.0 g/dL 11.4(L) 11.3(L) 10.7(L)  Hematocrit 36.0 - 46.0 % 34.4(L) 34.6(L) 31.4(L)  Platelets 150 - 400 K/uL 175 133(L) 107(L)   Lab Results  Component Value Date   MCV 87.8 09/29/2014   MCV 86.9 05/24/2014   MCV 88.2 05/23/2014   Lab Results  Component Value Date   TSH 2.780 05/22/2014  No results found for: HGBA1C  Lipid Panel     Component Value Date/Time   CHOL 192 05/23/2014 0830   TRIG 173* 05/23/2014 0830   HDL 39* 05/23/2014 0830   CHOLHDL 4.9 05/23/2014 0830   VLDL 35 05/23/2014 0830   LDLCALC 118* 05/23/2014 0830     RADIOLOGY: No results found.    ASSESSMENT AND PLAN: [name] is 15 years status post CABG revascularization surgery with a LIMA to her LAD and vein graft to obtuse marginal vessel. She also status post  mitral valve repair with annuloplasty ring and quadrangular resection of posterior leaflet which was done by Dr. Roxy Manns.  I reviewed her records from the hospital as well as her outpatient office visit since my last evaluation.  Her repeat  catheterization in December 2015 was unchanged from her previous study.  She had recently developed an exacerbation of her congestive heart failure.  Presently, she is orthostatic on physical examination.  I am recommending she change her Diovan from 80 mg in the morning and try taking this 40 mg twice a day.  She currently does not have any signs of edema, and she has been taking Lasix 20 mg on Monday, Wednesday and Friday.  I recommended she discontinue this and just take this on an as-needed basis.  She will continue with her current dose of Bystolic 5 mg.  She is on lipid lowering therapy with atorvastatin 20 mg.  She is on eliquis 2.5 mg twice a day for anticoagulation.  She does have migraine headaches.  She also recently has developed some memory issues.  She continues to have her chronic problem with bladder incontinence which is unchanged.  I will see her in 3 months for reevaluation.  Time spent: 30 minutes  Troy Sine, MD, [facility]  12/02/2014 12:36 PM

## 2014-12-12 ENCOUNTER — Other Ambulatory Visit: Payer: Self-pay | Admitting: Cardiology

## 2014-12-12 ENCOUNTER — Other Ambulatory Visit: Payer: Self-pay | Admitting: Cardiovascular Disease

## 2014-12-12 NOTE — Telephone Encounter (Signed)
REFILL 

## 2014-12-12 NOTE — Telephone Encounter (Signed)
Rx(s) sent to pharmacy electronically.  

## 2014-12-13 DIAGNOSIS — I480 Paroxysmal atrial fibrillation: Secondary | ICD-10-CM | POA: Diagnosis not present

## 2014-12-13 DIAGNOSIS — E785 Hyperlipidemia, unspecified: Secondary | ICD-10-CM | POA: Diagnosis not present

## 2015-01-18 ENCOUNTER — Ambulatory Visit (INDEPENDENT_AMBULATORY_CARE_PROVIDER_SITE_OTHER): Payer: Medicare Other | Admitting: Internal Medicine

## 2015-01-18 ENCOUNTER — Encounter: Payer: Self-pay | Admitting: Internal Medicine

## 2015-01-18 VITALS — BP 124/60 | HR 70 | Ht 60.0 in | Wt 127.0 lb

## 2015-01-18 DIAGNOSIS — Z950 Presence of cardiac pacemaker: Secondary | ICD-10-CM | POA: Diagnosis not present

## 2015-01-18 DIAGNOSIS — I442 Atrioventricular block, complete: Secondary | ICD-10-CM | POA: Diagnosis not present

## 2015-01-18 DIAGNOSIS — I480 Paroxysmal atrial fibrillation: Secondary | ICD-10-CM | POA: Diagnosis not present

## 2015-01-18 DIAGNOSIS — I255 Ischemic cardiomyopathy: Secondary | ICD-10-CM

## 2015-01-18 LAB — CUP PACEART INCLINIC DEVICE CHECK
Brady Statistic RA Percent Paced: 17 %
Date Time Interrogation Session: 20160729112813
Lead Channel Impedance Value: 987.5 Ohm
Lead Channel Pacing Threshold Amplitude: 0.5 V
Lead Channel Pacing Threshold Amplitude: 0.5 V
Lead Channel Pacing Threshold Amplitude: 2.75 V
Lead Channel Pacing Threshold Pulse Width: 0.5 ms
Lead Channel Pacing Threshold Pulse Width: 1 ms
Lead Channel Pacing Threshold Pulse Width: 1 ms
Lead Channel Setting Pacing Pulse Width: 0.5 ms
Lead Channel Setting Pacing Pulse Width: 1 ms
Lead Channel Setting Sensing Sensitivity: 5 mV
MDC IDC MSMT BATTERY REMAINING LONGEVITY: 61.2 mo
MDC IDC MSMT BATTERY VOLTAGE: 2.98 V
MDC IDC MSMT LEADCHNL LV PACING THRESHOLD AMPLITUDE: 2.25 V
MDC IDC MSMT LEADCHNL RA IMPEDANCE VALUE: 450 Ohm
MDC IDC MSMT LEADCHNL RA PACING THRESHOLD PULSEWIDTH: 0.4 ms
MDC IDC MSMT LEADCHNL RA SENSING INTR AMPL: 2.5 mV
MDC IDC MSMT LEADCHNL RV IMPEDANCE VALUE: 625 Ohm
MDC IDC PG MODEL: 3242
MDC IDC PG SERIAL: 2960456
MDC IDC SET LEADCHNL LV PACING AMPLITUDE: 3.25 V
MDC IDC SET LEADCHNL RA PACING AMPLITUDE: 2 V
MDC IDC SET LEADCHNL RV PACING AMPLITUDE: 2.5 V
MDC IDC STAT BRADY RV PERCENT PACED: 99.97 %

## 2015-01-18 NOTE — Progress Notes (Signed)
Patient Care Team: Myrlene Broker, MD as PCP - General (Family Medicine)   HPI  [name] is a 81 y.o. female Seen in followup for CRT-P. Upgrade accomplished 6/14.  Device was originally implanted for sick sinus syndrome  there is no appreciable change in functional status  She has hx of coronary artery disease with bypass graft in 2003. She had undergone prior stenting. She also  Had mitral valve repair at that time. Because of recurring chest pain in 2012 she underwent catheterization demonstrated a patent LIMA and patent vein graft to the ramus. A large and patent right CA   She has  a catheterization 2013-December demonstrating 80-85% left main and 70% ostial LAD at the point of an old stent in the ramus occlusion and a large dominant right. Ejection fraction was 35-40% repeat catheterization 05/2013 demonstrated normalization of systolic function  sshe has afib and is now on apixoban   She continues to struggle with fatigue and depression she says many days and then.   She has not had significant edema. Her shortness of breath is relatively stable.   Past Medical History  Diagnosis Date  . Chronic systolic heart failure 0/37/0488  . Ischemic cardiomyopathy- s/p CABG 10/11/2012    EF 39%  . Complete heart block 10/11/2012  . Pacemaker-Medtronic 10/11/2012    Updated to BiV  . Stress incontinence     multiple injections of collagen  . Anginal pain   . Hypertension   . Shortness of breath   . GERD (gastroesophageal reflux disease)   . H/O hiatal hernia   . Hyperlipidemia   . Coronary artery disease   . Weakness 10/06/12  . CHF (congestive heart failure)     hx  . OSA on CPAP 10/11/2012    No longer using CPAP due to worsening of Migraines. AHI was .45/hr at 9 cm water pressure. RDI was 0.4/hr. Patient was unable to reach REM sleep and maintain the supine position at optimal pressure. Patient was able to sleep for prolonged periods of time at 9 cm water  pressure without suffering further from respiratory events.  . Hypothyroidism   . Anemia 1940's  . History of blood transfusion     "related to uretheral OR & when 1st child was born"  . Migraine     ~ qd for 11 months now (05/23/2014)  . Depression   . Chronic anticoagulation     apixaban  . Thrombocytopenia     Past Surgical History  Procedure Laterality Date  . Bi-ventricular pacemaker revision (crt-r)  11/21/2012    BIV   UPGRADE    WITH DR Caryl Comes  . Appendectomy    . Incontinence surgery    . Cholecystectomy    . Insert / replace / remove pacemaker  2007  . US echocardiography  04/13/12    EF - 25% to 35% with intraventricular dyssynchrony, RVSP of 46, moderate TR, and mild to moderate AI. She had a mild increased mitral valve gradient of 5 mmHg with minimal MR.   [name] Lexiscan myocardial stress test  08/20/11    The EF = 34%. This is a low risk scan. There is a new fix abnormality in the distal LAD artery territory. Although no reversibility is seen, high grade obstruction with resting ischemia cannot be excluded.  . Cardiac valve replacement    . Coronary angioplasty with stent placement  2012    No significant CAD, patent but ectatic graft supplying  the ramus, patent LIMA-LAD, and an EF of 50% at that time. She had an occluded stent to her circumflex from remote intervention. Required recatheterization 05/2012 by Dr. Claiborne Billings and had a patent LIMA-LAD with distal diffuse disease, patent SVG-OM, 10%-20% mid right coronary but an EF of about 35%, quantitatively it was 38.9.  [name] Cardiac catheterization  05/2012    (1st cath 2012) This required recatheterization by Dr. Claiborne Billings and had a patent LIMA-LAD with distal diffuse disease, patent SVG-OM, 10% to 20% mid right coronary but an EF of about 35%, quantitatively iw was 38.9.  [name] Cardiac catheterization      "today;s makes 28" (05/23/2014)  . Coronary angioplasty with stent placement      "I have a tota of 4" (05/23/2014)  . Coronary artery  bypass graft  2003    With LIMA-LAD and vein graft to OM, and associated mitral valve repair with anuloplasty ring and quadrangular resection of the posterior leaflet, 24 mm Seguin ring by Dr. Roxy Manns.  . Vaginal hysterectomy    . Pacemaker generator change    . Cataract extraction, bilateral Bilateral   . Left heart catheterization with coronary/graft angiogram N/A 05/23/2012    Procedure: LEFT HEART CATHETERIZATION WITH Beatrix Fetters;  Surgeon: Troy Sine, MD;  Location: [facility] CATH LAB;  Service: Cardiovascular;  Laterality: N/A;  . Bi-ventricular pacemaker upgrade N/A 10/31/2012    Procedure: BI-VENTRICULAR PACEMAKER UPGRADE;  Surgeon: Deboraha Sprang, MD;  Location: [facility] CATH LAB;  Service: Cardiovascular;  Laterality: N/A;  . Bi-ventricular pacemaker upgrade N/A 11/21/2012    Procedure: BI-VENTRICULAR PACEMAKER UPGRADE;  Surgeon: Deboraha Sprang, MD;  Location: [facility] CATH LAB;  Service: Cardiovascular;  Laterality: N/A;  . Left heart catheterization with coronary angiogram N/A 06/07/2013    Procedure: LEFT HEART CATHETERIZATION WITH CORONARY ANGIOGRAM;  Surgeon: Lorretta Harp, MD;  Location: [facility] CATH LAB;  Service: Cardiovascular;  Laterality: N/A;  . Left heart catheterization with coronary/graft angiogram N/A 05/23/2014    Procedure: LEFT HEART CATHETERIZATION WITH Beatrix Fetters;  Surgeon: Wellington Hampshire, MD;  Location: [place] CATH LAB;  Service: Cardiovascular;  Laterality: N/A;    Current Outpatient Prescriptions  Medication Sig Dispense Refill  . amLODipine (NORVASC) 5 MG tablet Take 5 mg by mouth daily.  11  . apixaban (ELIQUIS) 2.5 MG TABS tablet Take 1 tablet (2.5 mg total) by mouth 2 (two) times daily. 60 tablet 10  . atorvastatin (LIPITOR) 20 MG tablet Take 1 tablet (20 mg total) by mouth daily at 6 PM. 30 tablet 11  . buPROPion (WELLBUTRIN XL) 150 MG 24 hr tablet Take 150 mg by mouth at bedtime.     [name] BYSTOLIC 5 MG tablet TAKE 1 TABLET (5 MG TOTAL) BY MOUTH AT BEDTIME.  30 tablet 6  . Calcium Carbonate-Vitamin D (CALCIUM 600/VITAMIN D) 600-400 MG-UNIT per tablet Take 1 tablet by mouth daily.    . ergocalciferol (VITAMIN D2) 50000 UNITS capsule Take 50,000 Units by mouth once a week.    . furosemide (LASIX) 20 MG tablet Take 20 mg by mouth as needed for fluid or edema.     . Misc Natural Products (TURMERIC CURCUMIN) CAPS Take 1 capsule by mouth as needed (extra strength for migraines).    . nebivolol (BYSTOLIC) 5 MG tablet Take 1 tablet (5 mg total) by mouth at bedtime. 30 tablet 5  . NITROSTAT 0.4 MG SL tablet PLACE 1 TAB UNDER TONGUE EVERY 5 MINUTES UP TO 3 DOSES FOR CHEST PAIN 25 tablet  2  . PARoxetine (PAXIL) 30 MG tablet Take 30 mg by mouth every evening.    . potassium chloride (K-DUR) 10 MEQ tablet Take 10 mEq by mouth as needed (with lasix, fulid, edema).     . valsartan (DIOVAN) 80 MG tablet Take 0.5 tablets (40 mg total) by mouth 2 (two) times daily. 30 tablet 5  . valsartan-hydrochlorothiazide (DIOVAN-HCT) 320-25 MG per tablet TAKE 1 TABLET BY MOUTH DAILY. 90 tablet 3   No current facility-administered medications for this visit.    Allergies  Allergen Reactions  . Dilaudid [Hydromorphone Hcl] Other (See Comments)    "jerking"  . Morphine And Related Other (See Comments)    "jerking"  . Sulfa Antibiotics Other (See Comments)    unknown    Review of Systems negative except from HPI and PMH  Physical Exam BP 124/60 mmHg  Pulse 70  Ht 5' (1.524 m)  Wt 127 lb (57.607 kg)  BMI 24.80 kg/m2 Well developed and well nourished in no acute distress HENT normal E scleral and icterus clear Neck Supple JVP A-10 cm; carotids brisk and full Clear to ausculation   Regular   frate and rhythm, no murmurs gallops or rub Soft with active bowel sounds No clubbing cyanosis  Edema Alert and oriented, grossly normal motor and sensory function Skin Warm and Dry  ECG demonstrates sinus rhythm with a synchronous pacing   Assessment and   Plan  Coronary disease with prior bypass surgery  Atrial fibrillation detected on her device  Sick sinus syndrome  Pacemaker-St. Jude The patient's device was interrogated.  The information was reviewed. No changes were made in the programming.    Congestive heart failure-chronic-systolic  Fatigue  Depression  She has untreated sleep apnea. She is encouraged to wear her CPAP.  She is modestly volume overloaded; we will increase her diuretics for 1 week.  We will continue her on her current other medications  Without symptoms of ischemia Blood pressure is well-controlled  I've asked that they consider with her primary care physician adjusting antidepressives

## 2015-01-18 NOTE — Patient Instructions (Addendum)
Medication Instructions:  Your physician has recommended you make the following change in your medication:  1) TAKE Lasix 20 mg daily for the next 5 days.    Labwork: NONE  Testing/Procedures: NONE  Follow-Up: Your physician wants you to follow-up in: [place], NP. You will receive a reminder letter in the mail two months in advance. If you don't receive a letter, please call our office to schedule the follow-up appointment.   Any Other Special Instructions Will Be Listed Below (If Applicable).

## 2015-01-29 DIAGNOSIS — I10 Essential (primary) hypertension: Secondary | ICD-10-CM | POA: Diagnosis not present

## 2015-01-29 DIAGNOSIS — F329 Major depressive disorder, single episode, unspecified: Secondary | ICD-10-CM | POA: Diagnosis not present

## 2015-01-29 DIAGNOSIS — Z0000 Encounter for general adult medical examination without abnormal findings: Secondary | ICD-10-CM | POA: Diagnosis not present

## 2015-02-11 ENCOUNTER — Other Ambulatory Visit: Payer: Self-pay | Admitting: Internal Medicine

## 2015-03-08 ENCOUNTER — Ambulatory Visit: Payer: Medicare Other | Admitting: Cardiovascular Disease

## 2015-03-08 ENCOUNTER — Telehealth: Payer: Self-pay | Admitting: Cardiovascular Disease

## 2015-03-08 NOTE — Telephone Encounter (Signed)
Pt's husband called in stating that he would like to speak with a nurse about some issues his wife is having. They had to cancelled their appt today because she was up all night sick and now she has a bad migraine and was unable to make the appt. Please f/u with the pt's husband.  Thanks

## 2015-03-08 NOTE — Telephone Encounter (Signed)
Patients husband talked about wishing he could have kept his wife's appointment today but she had been up all night with migraine which she has frequently and is followed up with by Dr. Domingo Cocking.  Said she hasn't been using her CPAP because she says it causes too much pressure in her head.  Said that she seems more depressed.  Cancelled appointment today but would like to get in earlier than appointment on November 3rd.  Is on waiting list for sooner appointment if one comes available

## 2015-03-11 DIAGNOSIS — R251 Tremor, unspecified: Secondary | ICD-10-CM | POA: Diagnosis not present

## 2015-03-11 DIAGNOSIS — E869 Volume depletion, unspecified: Secondary | ICD-10-CM | POA: Diagnosis not present

## 2015-03-11 DIAGNOSIS — Z23 Encounter for immunization: Secondary | ICD-10-CM | POA: Diagnosis not present

## 2015-03-11 DIAGNOSIS — I951 Orthostatic hypotension: Secondary | ICD-10-CM | POA: Diagnosis not present

## 2015-03-11 DIAGNOSIS — L74519 Primary focal hyperhidrosis, unspecified: Secondary | ICD-10-CM | POA: Diagnosis not present

## 2015-03-12 DIAGNOSIS — R296 Repeated falls: Secondary | ICD-10-CM | POA: Diagnosis not present

## 2015-03-12 DIAGNOSIS — Z885 Allergy status to narcotic agent status: Secondary | ICD-10-CM | POA: Diagnosis not present

## 2015-03-12 DIAGNOSIS — Z8673 Personal history of transient ischemic attack (TIA), and cerebral infarction without residual deficits: Secondary | ICD-10-CM | POA: Diagnosis not present

## 2015-03-12 DIAGNOSIS — E039 Hypothyroidism, unspecified: Secondary | ICD-10-CM | POA: Diagnosis present

## 2015-03-12 DIAGNOSIS — I252 Old myocardial infarction: Secondary | ICD-10-CM | POA: Diagnosis not present

## 2015-03-12 DIAGNOSIS — R0602 Shortness of breath: Secondary | ICD-10-CM | POA: Diagnosis not present

## 2015-03-12 DIAGNOSIS — N289 Disorder of kidney and ureter, unspecified: Secondary | ICD-10-CM | POA: Diagnosis present

## 2015-03-12 DIAGNOSIS — N3000 Acute cystitis without hematuria: Secondary | ICD-10-CM | POA: Diagnosis not present

## 2015-03-12 DIAGNOSIS — R410 Disorientation, unspecified: Secondary | ICD-10-CM | POA: Diagnosis not present

## 2015-03-12 DIAGNOSIS — I25810 Atherosclerosis of coronary artery bypass graft(s) without angina pectoris: Secondary | ICD-10-CM | POA: Diagnosis not present

## 2015-03-12 DIAGNOSIS — Z79899 Other long term (current) drug therapy: Secondary | ICD-10-CM | POA: Diagnosis not present

## 2015-03-12 DIAGNOSIS — R55 Syncope and collapse: Secondary | ICD-10-CM | POA: Diagnosis not present

## 2015-03-12 DIAGNOSIS — Z951 Presence of aortocoronary bypass graft: Secondary | ICD-10-CM | POA: Diagnosis not present

## 2015-03-12 DIAGNOSIS — Z882 Allergy status to sulfonamides status: Secondary | ICD-10-CM | POA: Diagnosis not present

## 2015-03-12 DIAGNOSIS — E860 Dehydration: Secondary | ICD-10-CM | POA: Diagnosis not present

## 2015-03-12 DIAGNOSIS — I4891 Unspecified atrial fibrillation: Secondary | ICD-10-CM | POA: Diagnosis not present

## 2015-03-12 DIAGNOSIS — I10 Essential (primary) hypertension: Secondary | ICD-10-CM | POA: Diagnosis present

## 2015-03-12 DIAGNOSIS — I482 Chronic atrial fibrillation: Secondary | ICD-10-CM | POA: Diagnosis not present

## 2015-03-12 DIAGNOSIS — R531 Weakness: Secondary | ICD-10-CM | POA: Diagnosis not present

## 2015-03-12 DIAGNOSIS — N390 Urinary tract infection, site not specified: Secondary | ICD-10-CM | POA: Diagnosis not present

## 2015-03-12 DIAGNOSIS — Z950 Presence of cardiac pacemaker: Secondary | ICD-10-CM | POA: Diagnosis not present

## 2015-03-15 DIAGNOSIS — I4891 Unspecified atrial fibrillation: Secondary | ICD-10-CM | POA: Diagnosis not present

## 2015-03-15 DIAGNOSIS — R531 Weakness: Secondary | ICD-10-CM | POA: Diagnosis not present

## 2015-03-15 DIAGNOSIS — N289 Disorder of kidney and ureter, unspecified: Secondary | ICD-10-CM | POA: Diagnosis not present

## 2015-03-15 DIAGNOSIS — N390 Urinary tract infection, site not specified: Secondary | ICD-10-CM | POA: Diagnosis not present

## 2015-03-15 DIAGNOSIS — N3000 Acute cystitis without hematuria: Secondary | ICD-10-CM | POA: Diagnosis not present

## 2015-03-15 DIAGNOSIS — R262 Difficulty in walking, not elsewhere classified: Secondary | ICD-10-CM | POA: Diagnosis not present

## 2015-03-15 DIAGNOSIS — I2510 Atherosclerotic heart disease of native coronary artery without angina pectoris: Secondary | ICD-10-CM | POA: Diagnosis not present

## 2015-03-15 DIAGNOSIS — R55 Syncope and collapse: Secondary | ICD-10-CM | POA: Diagnosis not present

## 2015-03-18 DIAGNOSIS — I2510 Atherosclerotic heart disease of native coronary artery without angina pectoris: Secondary | ICD-10-CM | POA: Diagnosis not present

## 2015-03-18 DIAGNOSIS — N390 Urinary tract infection, site not specified: Secondary | ICD-10-CM | POA: Diagnosis not present

## 2015-03-18 DIAGNOSIS — R262 Difficulty in walking, not elsewhere classified: Secondary | ICD-10-CM | POA: Diagnosis not present

## 2015-03-18 DIAGNOSIS — I4891 Unspecified atrial fibrillation: Secondary | ICD-10-CM | POA: Diagnosis not present

## 2015-03-25 DIAGNOSIS — Z951 Presence of aortocoronary bypass graft: Secondary | ICD-10-CM | POA: Diagnosis not present

## 2015-03-25 DIAGNOSIS — I482 Chronic atrial fibrillation: Secondary | ICD-10-CM | POA: Diagnosis not present

## 2015-03-25 DIAGNOSIS — Z950 Presence of cardiac pacemaker: Secondary | ICD-10-CM | POA: Diagnosis not present

## 2015-03-25 DIAGNOSIS — Z87440 Personal history of urinary (tract) infections: Secondary | ICD-10-CM | POA: Diagnosis not present

## 2015-03-25 DIAGNOSIS — I10 Essential (primary) hypertension: Secondary | ICD-10-CM | POA: Diagnosis not present

## 2015-03-25 DIAGNOSIS — Z7901 Long term (current) use of anticoagulants: Secondary | ICD-10-CM | POA: Diagnosis not present

## 2015-03-25 DIAGNOSIS — I2510 Atherosclerotic heart disease of native coronary artery without angina pectoris: Secondary | ICD-10-CM | POA: Diagnosis not present

## 2015-03-25 DIAGNOSIS — N289 Disorder of kidney and ureter, unspecified: Secondary | ICD-10-CM | POA: Diagnosis not present

## 2015-03-25 DIAGNOSIS — R531 Weakness: Secondary | ICD-10-CM | POA: Diagnosis not present

## 2015-03-27 DIAGNOSIS — Z87440 Personal history of urinary (tract) infections: Secondary | ICD-10-CM | POA: Diagnosis not present

## 2015-03-27 DIAGNOSIS — R531 Weakness: Secondary | ICD-10-CM | POA: Diagnosis not present

## 2015-03-27 DIAGNOSIS — I2510 Atherosclerotic heart disease of native coronary artery without angina pectoris: Secondary | ICD-10-CM | POA: Diagnosis not present

## 2015-03-27 DIAGNOSIS — I482 Chronic atrial fibrillation: Secondary | ICD-10-CM | POA: Diagnosis not present

## 2015-03-27 DIAGNOSIS — I10 Essential (primary) hypertension: Secondary | ICD-10-CM | POA: Diagnosis not present

## 2015-03-27 DIAGNOSIS — N289 Disorder of kidney and ureter, unspecified: Secondary | ICD-10-CM | POA: Diagnosis not present

## 2015-03-28 DIAGNOSIS — I482 Chronic atrial fibrillation: Secondary | ICD-10-CM | POA: Diagnosis not present

## 2015-03-28 DIAGNOSIS — I2510 Atherosclerotic heart disease of native coronary artery without angina pectoris: Secondary | ICD-10-CM | POA: Diagnosis not present

## 2015-03-28 DIAGNOSIS — Z87440 Personal history of urinary (tract) infections: Secondary | ICD-10-CM | POA: Diagnosis not present

## 2015-03-28 DIAGNOSIS — I10 Essential (primary) hypertension: Secondary | ICD-10-CM | POA: Diagnosis not present

## 2015-03-28 DIAGNOSIS — R531 Weakness: Secondary | ICD-10-CM | POA: Diagnosis not present

## 2015-03-28 DIAGNOSIS — N289 Disorder of kidney and ureter, unspecified: Secondary | ICD-10-CM | POA: Diagnosis not present

## 2015-04-02 DIAGNOSIS — I10 Essential (primary) hypertension: Secondary | ICD-10-CM | POA: Diagnosis not present

## 2015-04-02 DIAGNOSIS — I2510 Atherosclerotic heart disease of native coronary artery without angina pectoris: Secondary | ICD-10-CM | POA: Diagnosis not present

## 2015-04-02 DIAGNOSIS — N289 Disorder of kidney and ureter, unspecified: Secondary | ICD-10-CM | POA: Diagnosis not present

## 2015-04-02 DIAGNOSIS — R531 Weakness: Secondary | ICD-10-CM | POA: Diagnosis not present

## 2015-04-02 DIAGNOSIS — Z87440 Personal history of urinary (tract) infections: Secondary | ICD-10-CM | POA: Diagnosis not present

## 2015-04-02 DIAGNOSIS — I482 Chronic atrial fibrillation: Secondary | ICD-10-CM | POA: Diagnosis not present

## 2015-04-04 DIAGNOSIS — Z87440 Personal history of urinary (tract) infections: Secondary | ICD-10-CM | POA: Diagnosis not present

## 2015-04-04 DIAGNOSIS — N289 Disorder of kidney and ureter, unspecified: Secondary | ICD-10-CM | POA: Diagnosis not present

## 2015-04-04 DIAGNOSIS — I482 Chronic atrial fibrillation: Secondary | ICD-10-CM | POA: Diagnosis not present

## 2015-04-04 DIAGNOSIS — I10 Essential (primary) hypertension: Secondary | ICD-10-CM | POA: Diagnosis not present

## 2015-04-04 DIAGNOSIS — R531 Weakness: Secondary | ICD-10-CM | POA: Diagnosis not present

## 2015-04-04 DIAGNOSIS — I2510 Atherosclerotic heart disease of native coronary artery without angina pectoris: Secondary | ICD-10-CM | POA: Diagnosis not present

## 2015-04-11 DIAGNOSIS — R531 Weakness: Secondary | ICD-10-CM | POA: Diagnosis not present

## 2015-04-11 DIAGNOSIS — N289 Disorder of kidney and ureter, unspecified: Secondary | ICD-10-CM | POA: Diagnosis not present

## 2015-04-11 DIAGNOSIS — Z87440 Personal history of urinary (tract) infections: Secondary | ICD-10-CM | POA: Diagnosis not present

## 2015-04-11 DIAGNOSIS — I482 Chronic atrial fibrillation: Secondary | ICD-10-CM | POA: Diagnosis not present

## 2015-04-11 DIAGNOSIS — I2510 Atherosclerotic heart disease of native coronary artery without angina pectoris: Secondary | ICD-10-CM | POA: Diagnosis not present

## 2015-04-11 DIAGNOSIS — I10 Essential (primary) hypertension: Secondary | ICD-10-CM | POA: Diagnosis not present

## 2015-04-17 DIAGNOSIS — R531 Weakness: Secondary | ICD-10-CM | POA: Diagnosis not present

## 2015-04-17 DIAGNOSIS — N289 Disorder of kidney and ureter, unspecified: Secondary | ICD-10-CM | POA: Diagnosis not present

## 2015-04-17 DIAGNOSIS — I482 Chronic atrial fibrillation: Secondary | ICD-10-CM | POA: Diagnosis not present

## 2015-04-17 DIAGNOSIS — I10 Essential (primary) hypertension: Secondary | ICD-10-CM | POA: Diagnosis not present

## 2015-04-17 DIAGNOSIS — Z87440 Personal history of urinary (tract) infections: Secondary | ICD-10-CM | POA: Diagnosis not present

## 2015-04-17 DIAGNOSIS — I2510 Atherosclerotic heart disease of native coronary artery without angina pectoris: Secondary | ICD-10-CM | POA: Diagnosis not present

## 2015-04-18 DIAGNOSIS — R531 Weakness: Secondary | ICD-10-CM | POA: Diagnosis not present

## 2015-04-18 DIAGNOSIS — I2510 Atherosclerotic heart disease of native coronary artery without angina pectoris: Secondary | ICD-10-CM | POA: Diagnosis not present

## 2015-04-18 DIAGNOSIS — N289 Disorder of kidney and ureter, unspecified: Secondary | ICD-10-CM | POA: Diagnosis not present

## 2015-04-18 DIAGNOSIS — I482 Chronic atrial fibrillation: Secondary | ICD-10-CM | POA: Diagnosis not present

## 2015-04-18 DIAGNOSIS — Z87440 Personal history of urinary (tract) infections: Secondary | ICD-10-CM | POA: Diagnosis not present

## 2015-04-18 DIAGNOSIS — I10 Essential (primary) hypertension: Secondary | ICD-10-CM | POA: Diagnosis not present

## 2015-04-22 ENCOUNTER — Encounter: Payer: Medicare Other | Admitting: *Deleted

## 2015-04-24 ENCOUNTER — Encounter: Payer: Self-pay | Admitting: Cardiology

## 2015-04-25 ENCOUNTER — Ambulatory Visit (INDEPENDENT_AMBULATORY_CARE_PROVIDER_SITE_OTHER): Payer: Medicare Other | Admitting: Cardiovascular Disease

## 2015-04-25 VITALS — BP 124/60 | HR 73 | Ht 63.0 in | Wt 128.6 lb

## 2015-04-25 DIAGNOSIS — G4733 Obstructive sleep apnea (adult) (pediatric): Secondary | ICD-10-CM

## 2015-04-25 DIAGNOSIS — I2583 Coronary atherosclerosis due to lipid rich plaque: Secondary | ICD-10-CM

## 2015-04-25 DIAGNOSIS — Z7901 Long term (current) use of anticoagulants: Secondary | ICD-10-CM

## 2015-04-25 DIAGNOSIS — I480 Paroxysmal atrial fibrillation: Secondary | ICD-10-CM

## 2015-04-25 DIAGNOSIS — I2510 Atherosclerotic heart disease of native coronary artery without angina pectoris: Secondary | ICD-10-CM

## 2015-04-25 DIAGNOSIS — I255 Ischemic cardiomyopathy: Secondary | ICD-10-CM

## 2015-04-25 DIAGNOSIS — E785 Hyperlipidemia, unspecified: Secondary | ICD-10-CM

## 2015-04-25 DIAGNOSIS — R0602 Shortness of breath: Secondary | ICD-10-CM | POA: Diagnosis not present

## 2015-04-25 DIAGNOSIS — R5383 Other fatigue: Secondary | ICD-10-CM

## 2015-04-25 DIAGNOSIS — Z79899 Other long term (current) drug therapy: Secondary | ICD-10-CM

## 2015-04-25 DIAGNOSIS — N3000 Acute cystitis without hematuria: Secondary | ICD-10-CM | POA: Diagnosis not present

## 2015-04-25 NOTE — Patient Instructions (Signed)
Your physician recommends that you return for lab work TOMORROW - fasting  Please hold your lasix TOMORROW 11/4  Your physician recommends that you schedule a follow-up appointment 4 months with Dr. Claiborne Billings

## 2015-04-27 ENCOUNTER — Encounter: Payer: Self-pay | Admitting: Cardiovascular Disease

## 2015-04-27 NOTE — Progress Notes (Signed)
Patient ID: [name], female   DOB: 1930/07/20, 81 y.o.   MRN: [id number]    HPI: [name] is a 81 y.o. female who presents for 5 month cardiology evaluation.  [name]  has a history of CAD and had undergone intervention to an intermediate vessel. In 2003 she underwent CABG surgery with a LIMA to the LAD, vein to the intermediate and at that time also had mitral valve repair with angioplasty ring. Cardiac catheterization in December 2013 showed an ejection fraction of 35-40% with  significant native CAD with previously noted 80-85% distal left main stenosis, 70% ostial stenosis of her LAD proximal to the previously placed stent, total occlusion of the ramus intermediate vessel at the site of remote stenting. The circumflex was diminutive without stenosis. The RCA was a large dominant vessel with 20% luminal irregularities. She had dyskinetic segment in the distal anterolateral apical region. Initially, the patient had undergone permanent pacemaker insertion in 2007 for symptomatic bradycardia. An echo Doppler study in October 2013 showed reduction in ejection fraction to 25-35% with intraventricular dyssynchrony, moderate pulmonary hypertension with estimated RV systolic pressure 46 mm, moderate TR, and mild to moderate AR. She was referred to Dr. Caryl Comes and in June 2014 had a biventricular upgrade revision. An echo Doppler study on 02/21/2013  Showed significantly improved LV function with an ejection fraction of 50-55%. There was grade 2 diastolic dysfunction. Her septal motion showed paradox. There was mild aortic regurgitation and an  Mitral annuloplasty ring was present. There was mild left atrial dilatation. PA pressure was now 38 mm.  She has obstructive sleep apnea and has used CPAP therapy not consistently.    Since I last saw her, she has been started on anticoagulant therapy with a picks of a.m. After she was found to have a true fibrillation detected on her St. Jude device.  She has  tolerated Eliquis.  She was told I Dr. Caryl Comes to discontinue the aspirin.  She may have suffered a mini stroke on Eliquis in September 2015.  She went to the emergency room in [place].  Her blood pressure was significantly elevated at 916 systolic.  She was admitted to [facility] hospital in December 2015 with some chest pain.  She underwent repeat catheterization by Dr. Fletcher Anon on 05/23/2014 which revealed native disease with 80% distal left main stenosis, 90% ostial LAD stenosis with competitive flow from a patent LIMA and occluded mid circumflex as well as occluded ramus intermediate stent.  She had widely patent RCA, vein graft to the ramus, and LIMA to the LAD.  There is no significant change in her coronary anatomy since her last catheterization.  She was readmitted to [facility] hospital overnight on September 28, 2014  with chest pain in the setting of acute on chronic diastolic CHF.  She was transferred from [facility].  CK-MB was mildly elevated, although troponins were negative.  She improved with 1 dose of Lasix.  Her Diovan was held secondary to low blood pressure.    Since I last saw her, she has seen Dr. Caryl Comes in follow-up of her St. Jude device.  She has not been utilizing CPAP therapy despite her significant sleep apnea.  She has been experiencing chronic headaches.  She denies visual symptoms.  She had been seen by Dr. Domingo Cocking and had migraine headaches. She tells me that she was hospitalized for 5 days in [place] with a very significant urinary tract infection and ultimately had 5 days of rehabilitation following her hospitalization.  She denies any anginal type symptoms.  She is fatigued.  She is not active.  She presents for evaluation.  Past Medical History  Diagnosis Date  . Chronic systolic heart failure ([place]) 10/11/2012  . Ischemic cardiomyopathy- s/p CABG 10/11/2012    EF 39%  . Complete heart block ([place]) 10/11/2012  . Pacemaker-Medtronic 10/11/2012    Updated to BiV  . Stress incontinence      multiple injections of collagen  . Anginal pain ([place])   . Hypertension   . Shortness of breath   . GERD (gastroesophageal reflux disease)   . H/O hiatal hernia   . Hyperlipidemia   . Coronary artery disease   . Weakness 10/06/12  . CHF (congestive heart failure) (HCC)     hx  . OSA on CPAP 10/11/2012    No longer using CPAP due to worsening of Migraines. AHI was .45/hr at 9 cm water pressure. RDI was 0.4/hr. Patient was unable to reach REM sleep and maintain the supine position at optimal pressure. Patient was able to sleep for prolonged periods of time at 9 cm water pressure without suffering further from respiratory events.  . Hypothyroidism   . Anemia 1940's  . History of blood transfusion     "related to uretheral OR & when 1st child was born"  . Migraine     ~ qd for 11 months now (05/23/2014)  . Depression   . Chronic anticoagulation     apixaban  . Thrombocytopenia [facility])     Past Surgical History  Procedure Laterality Date  . Bi-ventricular pacemaker revision (crt-r)  11/21/2012    BIV   UPGRADE    WITH DR Caryl Comes  . Appendectomy    . Incontinence surgery    . Cholecystectomy    . Insert / replace / remove pacemaker  2007  . US echocardiography  04/13/12    EF - 25% to 35% with intraventricular dyssynchrony, RVSP of 46, moderate TR, and mild to moderate AI. She had a mild increased mitral valve gradient of 5 mmHg with minimal MR.   [name] Lexiscan myocardial stress test  08/20/11    The EF = 34%. This is a low risk scan. There is a new fix abnormality in the distal LAD artery territory. Although no reversibility is seen, high grade obstruction with resting ischemia cannot be excluded.  . Cardiac valve replacement    . Coronary angioplasty with stent placement  2012    No significant CAD, patent but ectatic graft supplying the ramus, patent LIMA-LAD, and an EF of 50% at that time. She had an occluded stent to her circumflex from remote intervention. Required recatheterization  05/2012 by Dr. Claiborne Billings and had a patent LIMA-LAD with distal diffuse disease, patent SVG-OM, 10%-20% mid right coronary but an EF of about 35%, quantitatively it was 38.9.  [name] Cardiac catheterization  05/2012    (1st cath 2012) This required recatheterization by Dr. Claiborne Billings and had a patent LIMA-LAD with distal diffuse disease, patent SVG-OM, 10% to 20% mid right coronary but an EF of about 35%, quantitatively iw was 38.9.  [name] Cardiac catheterization      "today;s makes 28" (05/23/2014)  . Coronary angioplasty with stent placement      "I have a tota of 4" (05/23/2014)  . Coronary artery bypass graft  2003    With LIMA-LAD and vein graft to OM, and associated mitral valve repair with anuloplasty ring and quadrangular resection of the posterior leaflet, 24 mm Seguin ring by  Dr. Roxy Manns.  . Vaginal hysterectomy    . Pacemaker generator change    . Cataract extraction, bilateral Bilateral   . Left heart catheterization with coronary/graft angiogram N/A 05/23/2012    Procedure: LEFT HEART CATHETERIZATION WITH Beatrix Fetters;  Surgeon: Troy Sine, MD;  Location: [facility] CATH LAB;  Service: Cardiovascular;  Laterality: N/A;  . Bi-ventricular pacemaker upgrade N/A 10/31/2012    Procedure: BI-VENTRICULAR PACEMAKER UPGRADE;  Surgeon: Deboraha Sprang, MD;  Location: [facility] CATH LAB;  Service: Cardiovascular;  Laterality: N/A;  . Bi-ventricular pacemaker upgrade N/A 11/21/2012    Procedure: BI-VENTRICULAR PACEMAKER UPGRADE;  Surgeon: Deboraha Sprang, MD;  Location: [facility] CATH LAB;  Service: Cardiovascular;  Laterality: N/A;  . Left heart catheterization with coronary angiogram N/A 06/07/2013    Procedure: LEFT HEART CATHETERIZATION WITH CORONARY ANGIOGRAM;  Surgeon: Lorretta Harp, MD;  Location: [facility] CATH LAB;  Service: Cardiovascular;  Laterality: N/A;  . Left heart catheterization with coronary/graft angiogram N/A 05/23/2014    Procedure: LEFT HEART CATHETERIZATION WITH Beatrix Fetters;  Surgeon: Wellington Hampshire, MD;  Location: [place] CATH LAB;  Service: Cardiovascular;  Laterality: N/A;    Allergies  Allergen Reactions  . Dilaudid [Hydromorphone Hcl] Other (See Comments)    "jerking"  . Morphine And Related Other (See Comments)    "jerking"  . Sulfa Antibiotics Other (See Comments)    unknown    Current Outpatient Prescriptions  Medication Sig Dispense Refill  . amLODipine (NORVASC) 5 MG tablet Take 5 mg by mouth daily.  11  . apixaban (ELIQUIS) 2.5 MG TABS tablet Take 1 tablet (2.5 mg total) by mouth 2 (two) times daily. 60 tablet 10  . atorvastatin (LIPITOR) 20 MG tablet Take 1 tablet (20 mg total) by mouth daily at 6 PM. 30 tablet 11  . buPROPion (WELLBUTRIN XL) 150 MG 24 hr tablet Take 150 mg by mouth at bedtime.     [name] BYSTOLIC 5 MG tablet TAKE 1 TABLET (5 MG TOTAL) BY MOUTH AT BEDTIME. 30 tablet 6  . Calcium Carbonate-Vitamin D (CALCIUM 600/VITAMIN D) 600-400 MG-UNIT per tablet Take 1 tablet by mouth daily.    [name] ELIQUIS 2.5 MG TABS tablet TAKE 1 TABLET TWICE DAILY 60 tablet 6  . ergocalciferol (VITAMIN D2) 50000 UNITS capsule Take 50,000 Units by mouth once a week.    . furosemide (LASIX) 20 MG tablet Take 20 mg by mouth as needed for fluid or edema.     . Misc Natural Products (TURMERIC CURCUMIN) CAPS Take 1 capsule by mouth as needed (extra strength for migraines).    . nebivolol (BYSTOLIC) 5 MG tablet Take 1 tablet (5 mg total) by mouth at bedtime. 30 tablet 5  . NITROSTAT 0.4 MG SL tablet PLACE 1 TAB UNDER TONGUE EVERY 5 MINUTES UP TO 3 DOSES FOR CHEST PAIN 25 tablet 2  . PARoxetine (PAXIL) 30 MG tablet Take 30 mg by mouth every evening.    . potassium chloride (K-DUR) 10 MEQ tablet Take 10 mEq by mouth as needed (with lasix, fulid, edema).     . valsartan (DIOVAN) 80 MG tablet Take 0.5 tablets (40 mg total) by mouth 2 (two) times daily. 30 tablet 5  . valsartan-hydrochlorothiazide (DIOVAN-HCT) 320-25 MG per tablet TAKE 1 TABLET BY MOUTH DAILY. 90 tablet 3   No current  facility-administered medications for this visit.    Social History   Social History  . Marital Status: Married    Spouse Name: N/A  . Number of Children:  N/A  . Years of Education: N/A   Occupational History  . Not on file.   Social History Main Topics  . Smoking status: Never Smoker   . Smokeless tobacco: Never Used  . Alcohol Use: No  . Drug Use: No  . Sexual Activity: Not Currently   Other Topics Concern  . Not on file   Social History Narrative   socially she is married has 3 children and 9 grandchildren.  Family History  Problem Relation Age of Onset  . Cancer Father    ROS General: Negative; No fevers, chills, or night sweats;  HEENT: Negative; No changes in vision or hearing, sinus congestion, difficulty swallowing Pulmonary: Negative; No cough, wheezing, shortness of breath, hemoptysis Cardiovascular: See history of present illness GI: Negative; No nausea, vomiting, diarrhea, or abdominal pain GU: Positive for chronic bladder incontinence Musculoskeletal: Negative; no myalgias, joint pain, or weakness Hematologic/Oncology: Negative; no easy bruising, bleeding Endocrine: Negative; no heat/cold intolerance; no diabetes Neuro: strip.  Migraine headaches.  Question mini stroke not documented in September Skin: Negative; No rashes or skin lesions Psychiatric: Negative; No behavioral problems, depression Sleep: positive for complex sleep apnea; no daytime sleepiness, hypersomnolence, bruxism, restless legs, hypnogognic hallucinations, no cataplexy Other comprehensive 14 point system review is negative.   PE BP 124/60 mmHg  Pulse 73  Ht $R'5\' 3"'FJ$  (1.6 m)  Wt 128 lb 9.6 oz (58.333 kg)  BMI 22.79 kg/m2   Wt Readings from Last 3 Encounters:  04/25/15 128 lb 9.6 oz (58.333 kg)  01/18/15 127 lb (57.607 kg)  11/29/14 127 lb 6.4 oz (57.788 kg)   General: Alert, oriented, no distress.  Skin: normal turgor, no rashes; warm and dry HEENT: Normocephalic, atraumatic.  Pupils round and reactive; sclera anicteric;no lid lag.  Nose without nasal septal hypertrophy Mouth/Parynx benign; Mallinpatti scale 3 Neck: No JVD, no carotid bruits with normal carotid upstroke Lungs: clear to ausculatation and percussion; no wheezing or rales Heart: RRR, s1 s2 normal 1- 2/6 systolic murmur; no diastolic murmur, rubs, thrills or heaves. Abdomen: soft, nontender; no hepatosplenomehaly, BS+; abdominal aorta nontender and not dilated by palpation. Pulses 2+ Extremities: no clubbing cyanosis or edema, Homan's sign negative  Neurologic: grossly nonfocal Psychologic: normal affect and mood.  ECG (independently read by me): atrial sensing ventricular paced rhythm with BiV pacemaker.  June 2016ECG (independently read by me): Atrial sensing and 100% ventricular pacing.  Biventricular pacemaker.  Ventricular rate 89 bpm.  November 2015 ECG (independently read by me): atrial sensing, ventricular pacing at 79 bpm  April 2015 ECG (independently read by me): Appropriate atrial sensing and pacing with 100% ventricular pacing  LABS: BMP Latest Ref Rng 10/12/2014 09/29/2014 05/22/2014  Glucose 70 - 99 mg/dL 128(H) 117(H) 97  BUN 6 - 23 mg/dL $Remove'22 18 15  'FwqlOwQ$ Creatinine 0.40 - 1.20 mg/dL 1.30(H) 1.19(H) 0.86  Sodium 135 - 145 mEq/L 135 136 137  Potassium 3.5 - 5.1 mEq/L 4.1 3.8 3.5(L)  Chloride 96 - 112 mEq/L 96 99 95(L)  CO2 19 - 32 mEq/L 34(H) 24 27  Calcium 8.4 - 10.5 mg/dL 9.8 9.3 9.5   Hepatic Function Latest Ref Rng 05/22/2014 06/07/2013 10/10/2010  Total Protein 6.0 - 8.3 g/dL 6.6 6.0 6.3  Albumin 3.5 - 5.2 g/dL 3.9 3.5 3.7  AST 0 - 37 U/L 21 17 68(H)  ALT 0 - 35 U/L 17 11 100(H)  Alk Phosphatase 39 - 117 U/L 54 58 218(H)  Total Bilirubin 0.3 - 1.2 mg/dL 0.5 0.2(L) 1.0  Bilirubin, Direct 0.0 - 0.3 mg/dL - - 0.2   CBC Latest Ref Rng 09/29/2014 05/24/2014 05/23/2014  WBC 4.0 - 10.5 K/uL 5.4 6.0 4.4  Hemoglobin 12.0 - 15.0 g/dL 11.4(L) 11.3(L) 10.7(L)  Hematocrit 36.0 - 46.0 % 34.4(L)  34.6(L) 31.4(L)  Platelets 150 - 400 K/uL 175 133(L) 107(L)   Lab Results  Component Value Date   MCV 87.8 09/29/2014   MCV 86.9 05/24/2014   MCV 88.2 05/23/2014   Lab Results  Component Value Date   TSH 2.780 05/22/2014  No results found for: HGBA1C  Lipid Panel     Component Value Date/Time   CHOL 192 05/23/2014 0830   TRIG 173* 05/23/2014 0830   HDL 39* 05/23/2014 0830   CHOLHDL 4.9 05/23/2014 0830   VLDL 35 05/23/2014 0830   LDLCALC 118* 05/23/2014 0830     RADIOLOGY: No results found.    ASSESSMENT AND PLAN: [name] is an 81 year old white female who is now 69 years status post CABG revascularization surgery with a LIMA to her LAD and vein graft to obtuse marginal vessel. She also status post mitral valve repair with annuloplasty ring and quadrangular resection of posterior leaflet which was done by Dr. Roxy Manns.  Her repeat catheterization in December 2015 was unchanged from her previous study.  She had recently developed an exacerbation of her congestive heart failure. She denies any recurrent chest pain symptomatology.  She has had difficulty with recent significant urinary tract infection requiring five-day hospitalization [place], and ultimate rehabilitation.  She has been plagued by chronic headaches.  She has seen Dr. Domingo Cocking in the past.  Her ECG today shows appropriate pacemaker function.  On exam, there are no signs of overt CHF.  She continues to be on eloquence anticoagulation.  There is depression for which she is on appropriate and 150 mg.  Her blood pressure today is stable on valsartan HCT, Bystolic, and low-dose amlodipine.  I am recommending follow-up laboratory be obtained including chemistry, CBC with differential, TSH, lipid studies and will also check a BNP.  I will see her in 4 months for reevaluation.  Time spent: 30 minutes  Troy Sine, MD, [facility]  04/27/2015 9:37 AM

## 2015-04-29 DIAGNOSIS — N289 Disorder of kidney and ureter, unspecified: Secondary | ICD-10-CM | POA: Diagnosis not present

## 2015-04-29 DIAGNOSIS — I482 Chronic atrial fibrillation: Secondary | ICD-10-CM | POA: Diagnosis not present

## 2015-04-29 DIAGNOSIS — E860 Dehydration: Secondary | ICD-10-CM | POA: Diagnosis not present

## 2015-04-29 DIAGNOSIS — F329 Major depressive disorder, single episode, unspecified: Secondary | ICD-10-CM | POA: Diagnosis not present

## 2015-04-29 DIAGNOSIS — R42 Dizziness and giddiness: Secondary | ICD-10-CM | POA: Diagnosis not present

## 2015-04-29 DIAGNOSIS — E039 Hypothyroidism, unspecified: Secondary | ICD-10-CM | POA: Diagnosis not present

## 2015-04-29 DIAGNOSIS — R531 Weakness: Secondary | ICD-10-CM | POA: Diagnosis not present

## 2015-04-29 DIAGNOSIS — I10 Essential (primary) hypertension: Secondary | ICD-10-CM | POA: Diagnosis not present

## 2015-04-29 DIAGNOSIS — Z9181 History of falling: Secondary | ICD-10-CM | POA: Diagnosis not present

## 2015-04-29 DIAGNOSIS — R278 Other lack of coordination: Secondary | ICD-10-CM | POA: Diagnosis not present

## 2015-04-29 DIAGNOSIS — R32 Unspecified urinary incontinence: Secondary | ICD-10-CM | POA: Diagnosis not present

## 2015-04-29 DIAGNOSIS — I2510 Atherosclerotic heart disease of native coronary artery without angina pectoris: Secondary | ICD-10-CM | POA: Diagnosis not present

## 2015-04-29 DIAGNOSIS — G4452 New daily persistent headache (NDPH): Secondary | ICD-10-CM | POA: Diagnosis not present

## 2015-04-29 DIAGNOSIS — R262 Difficulty in walking, not elsewhere classified: Secondary | ICD-10-CM | POA: Diagnosis not present

## 2015-04-29 DIAGNOSIS — N3000 Acute cystitis without hematuria: Secondary | ICD-10-CM | POA: Diagnosis not present

## 2015-04-29 DIAGNOSIS — Z79899 Other long term (current) drug therapy: Secondary | ICD-10-CM | POA: Diagnosis not present

## 2015-04-29 DIAGNOSIS — Z7901 Long term (current) use of anticoagulants: Secondary | ICD-10-CM | POA: Diagnosis not present

## 2015-04-30 DIAGNOSIS — N289 Disorder of kidney and ureter, unspecified: Secondary | ICD-10-CM | POA: Diagnosis not present

## 2015-04-30 DIAGNOSIS — R42 Dizziness and giddiness: Secondary | ICD-10-CM | POA: Diagnosis not present

## 2015-04-30 DIAGNOSIS — I10 Essential (primary) hypertension: Secondary | ICD-10-CM | POA: Diagnosis not present

## 2015-04-30 DIAGNOSIS — R278 Other lack of coordination: Secondary | ICD-10-CM | POA: Diagnosis not present

## 2015-04-30 DIAGNOSIS — G4452 New daily persistent headache (NDPH): Secondary | ICD-10-CM | POA: Diagnosis not present

## 2015-05-01 DIAGNOSIS — G4452 New daily persistent headache (NDPH): Secondary | ICD-10-CM | POA: Diagnosis not present

## 2015-05-01 DIAGNOSIS — N289 Disorder of kidney and ureter, unspecified: Secondary | ICD-10-CM | POA: Diagnosis not present

## 2015-05-01 DIAGNOSIS — I10 Essential (primary) hypertension: Secondary | ICD-10-CM | POA: Diagnosis not present

## 2015-05-01 DIAGNOSIS — R42 Dizziness and giddiness: Secondary | ICD-10-CM | POA: Diagnosis not present

## 2015-05-01 DIAGNOSIS — R296 Repeated falls: Secondary | ICD-10-CM | POA: Diagnosis not present

## 2015-05-01 DIAGNOSIS — R278 Other lack of coordination: Secondary | ICD-10-CM | POA: Diagnosis not present

## 2015-05-03 DIAGNOSIS — R32 Unspecified urinary incontinence: Secondary | ICD-10-CM | POA: Diagnosis not present

## 2015-05-03 DIAGNOSIS — I25119 Atherosclerotic heart disease of native coronary artery with unspecified angina pectoris: Secondary | ICD-10-CM | POA: Diagnosis not present

## 2015-05-03 DIAGNOSIS — R296 Repeated falls: Secondary | ICD-10-CM | POA: Diagnosis not present

## 2015-05-03 DIAGNOSIS — G319 Degenerative disease of nervous system, unspecified: Secondary | ICD-10-CM | POA: Diagnosis not present

## 2015-05-03 DIAGNOSIS — M6281 Muscle weakness (generalized): Secondary | ICD-10-CM | POA: Diagnosis not present

## 2015-05-03 DIAGNOSIS — I482 Chronic atrial fibrillation: Secondary | ICD-10-CM | POA: Diagnosis not present

## 2015-05-03 DIAGNOSIS — F329 Major depressive disorder, single episode, unspecified: Secondary | ICD-10-CM | POA: Diagnosis not present

## 2015-05-03 DIAGNOSIS — G4452 New daily persistent headache (NDPH): Secondary | ICD-10-CM | POA: Diagnosis not present

## 2015-05-03 DIAGNOSIS — I10 Essential (primary) hypertension: Secondary | ICD-10-CM | POA: Diagnosis not present

## 2015-05-03 DIAGNOSIS — Z8673 Personal history of transient ischemic attack (TIA), and cerebral infarction without residual deficits: Secondary | ICD-10-CM | POA: Diagnosis not present

## 2015-05-03 DIAGNOSIS — Z950 Presence of cardiac pacemaker: Secondary | ICD-10-CM | POA: Diagnosis not present

## 2015-05-03 DIAGNOSIS — R42 Dizziness and giddiness: Secondary | ICD-10-CM | POA: Diagnosis not present

## 2015-05-06 ENCOUNTER — Telehealth: Payer: Self-pay | Admitting: Internal Medicine

## 2015-05-06 DIAGNOSIS — M6281 Muscle weakness (generalized): Secondary | ICD-10-CM | POA: Diagnosis not present

## 2015-05-06 DIAGNOSIS — I25119 Atherosclerotic heart disease of native coronary artery with unspecified angina pectoris: Secondary | ICD-10-CM | POA: Diagnosis not present

## 2015-05-06 DIAGNOSIS — R296 Repeated falls: Secondary | ICD-10-CM | POA: Diagnosis not present

## 2015-05-06 DIAGNOSIS — G4452 New daily persistent headache (NDPH): Secondary | ICD-10-CM | POA: Diagnosis not present

## 2015-05-06 DIAGNOSIS — R42 Dizziness and giddiness: Secondary | ICD-10-CM | POA: Diagnosis not present

## 2015-05-06 DIAGNOSIS — G319 Degenerative disease of nervous system, unspecified: Secondary | ICD-10-CM | POA: Diagnosis not present

## 2015-05-06 NOTE — Telephone Encounter (Signed)
LMOVM for pt to return call 

## 2015-05-06 NOTE — Telephone Encounter (Signed)
°  1. Has your device fired? No ° °2. Is you device beeping? No ° °3. Are you experiencing draining or swelling at device site? No ° °4. Are you calling to see if we received your device transmission? Yes ° °5. Have you passed out? No ° °

## 2015-05-07 NOTE — Telephone Encounter (Signed)
Spoke w/ pt husband and informed him that we have not received a transmission since April 2016. Pt husband said he is going to resend transmission tonight. Request a call back tomorrow to let them know if we got the transmission or not.

## 2015-05-08 ENCOUNTER — Telehealth: Payer: Self-pay | Admitting: Cardiology

## 2015-05-08 ENCOUNTER — Ambulatory Visit (INDEPENDENT_AMBULATORY_CARE_PROVIDER_SITE_OTHER): Payer: Medicare Other | Admitting: *Deleted

## 2015-05-08 DIAGNOSIS — I442 Atrioventricular block, complete: Secondary | ICD-10-CM | POA: Diagnosis not present

## 2015-05-08 DIAGNOSIS — I25119 Atherosclerotic heart disease of native coronary artery with unspecified angina pectoris: Secondary | ICD-10-CM | POA: Diagnosis not present

## 2015-05-08 DIAGNOSIS — R42 Dizziness and giddiness: Secondary | ICD-10-CM | POA: Diagnosis not present

## 2015-05-08 DIAGNOSIS — G4452 New daily persistent headache (NDPH): Secondary | ICD-10-CM | POA: Diagnosis not present

## 2015-05-08 DIAGNOSIS — M6281 Muscle weakness (generalized): Secondary | ICD-10-CM | POA: Diagnosis not present

## 2015-05-08 DIAGNOSIS — G319 Degenerative disease of nervous system, unspecified: Secondary | ICD-10-CM | POA: Diagnosis not present

## 2015-05-08 DIAGNOSIS — R296 Repeated falls: Secondary | ICD-10-CM | POA: Diagnosis not present

## 2015-05-08 NOTE — Telephone Encounter (Signed)
Informed pt husband that we still have not received a transmission. Instructed him to send a manual transmission and how. Pt husband verbalized understanding.

## 2015-05-08 NOTE — Telephone Encounter (Signed)
-----   Message from [place], [place] sent at 05/07/2015  9:02 AM EST ----- Regarding: call pt back and let them know if we recieved transmission call pt back and let them know if we recieved transmission

## 2015-05-09 DIAGNOSIS — F329 Major depressive disorder, single episode, unspecified: Secondary | ICD-10-CM | POA: Diagnosis not present

## 2015-05-09 DIAGNOSIS — I10 Essential (primary) hypertension: Secondary | ICD-10-CM | POA: Diagnosis not present

## 2015-05-09 DIAGNOSIS — R5381 Other malaise: Secondary | ICD-10-CM | POA: Diagnosis not present

## 2015-05-10 NOTE — Progress Notes (Signed)
Remote pacemaker transmission.   

## 2015-05-13 DIAGNOSIS — I25119 Atherosclerotic heart disease of native coronary artery with unspecified angina pectoris: Secondary | ICD-10-CM | POA: Diagnosis not present

## 2015-05-13 DIAGNOSIS — G4452 New daily persistent headache (NDPH): Secondary | ICD-10-CM | POA: Diagnosis not present

## 2015-05-13 DIAGNOSIS — R42 Dizziness and giddiness: Secondary | ICD-10-CM | POA: Diagnosis not present

## 2015-05-13 DIAGNOSIS — R296 Repeated falls: Secondary | ICD-10-CM | POA: Diagnosis not present

## 2015-05-13 DIAGNOSIS — M6281 Muscle weakness (generalized): Secondary | ICD-10-CM | POA: Diagnosis not present

## 2015-05-13 DIAGNOSIS — G319 Degenerative disease of nervous system, unspecified: Secondary | ICD-10-CM | POA: Diagnosis not present

## 2015-05-21 DIAGNOSIS — R42 Dizziness and giddiness: Secondary | ICD-10-CM | POA: Diagnosis not present

## 2015-05-21 DIAGNOSIS — R296 Repeated falls: Secondary | ICD-10-CM | POA: Diagnosis not present

## 2015-05-21 DIAGNOSIS — I25119 Atherosclerotic heart disease of native coronary artery with unspecified angina pectoris: Secondary | ICD-10-CM | POA: Diagnosis not present

## 2015-05-21 DIAGNOSIS — G319 Degenerative disease of nervous system, unspecified: Secondary | ICD-10-CM | POA: Diagnosis not present

## 2015-05-21 DIAGNOSIS — G4452 New daily persistent headache (NDPH): Secondary | ICD-10-CM | POA: Diagnosis not present

## 2015-05-21 DIAGNOSIS — M6281 Muscle weakness (generalized): Secondary | ICD-10-CM | POA: Diagnosis not present

## 2015-05-24 DIAGNOSIS — Z87898 Personal history of other specified conditions: Secondary | ICD-10-CM | POA: Diagnosis not present

## 2015-05-24 DIAGNOSIS — I63341 Cerebral infarction due to thrombosis of right cerebellar artery: Secondary | ICD-10-CM | POA: Diagnosis not present

## 2015-05-24 DIAGNOSIS — G309 Alzheimer's disease, unspecified: Secondary | ICD-10-CM | POA: Diagnosis not present

## 2015-05-24 DIAGNOSIS — G3184 Mild cognitive impairment, so stated: Secondary | ICD-10-CM | POA: Diagnosis not present

## 2015-05-24 LAB — CUP PACEART REMOTE DEVICE CHECK
Brady Statistic AP VP Percent: 6.6 %
Brady Statistic AP VS Percent: 1 %
Brady Statistic AS VP Percent: 93 %
Brady Statistic RA Percent Paced: 6.6 %
Date Time Interrogation Session: 20161117022850
Implantable Lead Implant Date: 20070720
Implantable Lead Implant Date: 20070720
Implantable Lead Implant Date: 20140602
Implantable Lead Location: 753858
Implantable Lead Location: 753859
Implantable Lead Location: 753860
Implantable Lead Model: 4092
Implantable Lead Model: 5076
Lead Channel Impedance Value: 580 Ohm
Lead Channel Impedance Value: 960 Ohm
Lead Channel Pacing Threshold Pulse Width: 0.4 ms
Lead Channel Sensing Intrinsic Amplitude: 12 mV
Lead Channel Setting Pacing Amplitude: 2 V
Lead Channel Setting Pacing Amplitude: 3.375
MDC IDC MSMT BATTERY REMAINING LONGEVITY: 62 mo
MDC IDC MSMT BATTERY REMAINING PERCENTAGE: 95.5 %
MDC IDC MSMT BATTERY VOLTAGE: 2.96 V
MDC IDC MSMT LEADCHNL LV PACING THRESHOLD AMPLITUDE: 2.375 V
MDC IDC MSMT LEADCHNL LV PACING THRESHOLD PULSEWIDTH: 1 ms
MDC IDC MSMT LEADCHNL RA IMPEDANCE VALUE: 430 Ohm
MDC IDC MSMT LEADCHNL RA PACING THRESHOLD AMPLITUDE: 0.5 V
MDC IDC MSMT LEADCHNL RA SENSING INTR AMPL: 2 mV
MDC IDC MSMT LEADCHNL RV PACING THRESHOLD AMPLITUDE: 0.5 V
MDC IDC MSMT LEADCHNL RV PACING THRESHOLD PULSEWIDTH: 0.5 ms
MDC IDC PG MODEL: 3242
MDC IDC PG SERIAL: 2960456
MDC IDC SET LEADCHNL LV PACING PULSEWIDTH: 1 ms
MDC IDC SET LEADCHNL RV PACING AMPLITUDE: 2.5 V
MDC IDC SET LEADCHNL RV PACING PULSEWIDTH: 0.5 ms
MDC IDC SET LEADCHNL RV SENSING SENSITIVITY: 5 mV
MDC IDC STAT BRADY AS VS PERCENT: 1 %

## 2015-05-27 ENCOUNTER — Encounter: Payer: Self-pay | Admitting: Cardiology

## 2015-05-27 DIAGNOSIS — R296 Repeated falls: Secondary | ICD-10-CM | POA: Diagnosis not present

## 2015-05-27 DIAGNOSIS — G319 Degenerative disease of nervous system, unspecified: Secondary | ICD-10-CM | POA: Diagnosis not present

## 2015-05-27 DIAGNOSIS — G4452 New daily persistent headache (NDPH): Secondary | ICD-10-CM | POA: Diagnosis not present

## 2015-05-27 DIAGNOSIS — M6281 Muscle weakness (generalized): Secondary | ICD-10-CM | POA: Diagnosis not present

## 2015-05-27 DIAGNOSIS — I25119 Atherosclerotic heart disease of native coronary artery with unspecified angina pectoris: Secondary | ICD-10-CM | POA: Diagnosis not present

## 2015-05-27 DIAGNOSIS — R42 Dizziness and giddiness: Secondary | ICD-10-CM | POA: Diagnosis not present

## 2015-05-28 DIAGNOSIS — G309 Alzheimer's disease, unspecified: Secondary | ICD-10-CM | POA: Diagnosis not present

## 2015-05-28 DIAGNOSIS — D509 Iron deficiency anemia, unspecified: Secondary | ICD-10-CM | POA: Diagnosis not present

## 2015-05-28 DIAGNOSIS — Z79899 Other long term (current) drug therapy: Secondary | ICD-10-CM | POA: Diagnosis not present

## 2015-05-29 DIAGNOSIS — R42 Dizziness and giddiness: Secondary | ICD-10-CM | POA: Diagnosis not present

## 2015-05-29 DIAGNOSIS — R296 Repeated falls: Secondary | ICD-10-CM | POA: Diagnosis not present

## 2015-05-29 DIAGNOSIS — M6281 Muscle weakness (generalized): Secondary | ICD-10-CM | POA: Diagnosis not present

## 2015-05-29 DIAGNOSIS — G4452 New daily persistent headache (NDPH): Secondary | ICD-10-CM | POA: Diagnosis not present

## 2015-05-29 DIAGNOSIS — I25119 Atherosclerotic heart disease of native coronary artery with unspecified angina pectoris: Secondary | ICD-10-CM | POA: Diagnosis not present

## 2015-05-29 DIAGNOSIS — G319 Degenerative disease of nervous system, unspecified: Secondary | ICD-10-CM | POA: Diagnosis not present

## 2015-06-01 DIAGNOSIS — S32502A Unspecified fracture of left pubis, initial encounter for closed fracture: Secondary | ICD-10-CM | POA: Diagnosis not present

## 2015-06-01 DIAGNOSIS — M545 Low back pain: Secondary | ICD-10-CM | POA: Diagnosis not present

## 2015-06-03 DIAGNOSIS — M5416 Radiculopathy, lumbar region: Secondary | ICD-10-CM | POA: Diagnosis not present

## 2015-06-03 DIAGNOSIS — M4806 Spinal stenosis, lumbar region: Secondary | ICD-10-CM | POA: Diagnosis not present

## 2015-06-03 DIAGNOSIS — Z87898 Personal history of other specified conditions: Secondary | ICD-10-CM | POA: Diagnosis not present

## 2015-06-06 DIAGNOSIS — R262 Difficulty in walking, not elsewhere classified: Secondary | ICD-10-CM | POA: Diagnosis not present

## 2015-06-06 DIAGNOSIS — S32502S Unspecified fracture of left pubis, sequela: Secondary | ICD-10-CM | POA: Diagnosis not present

## 2015-06-08 DIAGNOSIS — I255 Ischemic cardiomyopathy: Secondary | ICD-10-CM | POA: Diagnosis not present

## 2015-06-08 DIAGNOSIS — S32502D Unspecified fracture of left pubis, subsequent encounter for fracture with routine healing: Secondary | ICD-10-CM | POA: Diagnosis not present

## 2015-06-08 DIAGNOSIS — Z950 Presence of cardiac pacemaker: Secondary | ICD-10-CM | POA: Diagnosis not present

## 2015-06-08 DIAGNOSIS — Z7901 Long term (current) use of anticoagulants: Secondary | ICD-10-CM | POA: Diagnosis not present

## 2015-06-08 DIAGNOSIS — I5022 Chronic systolic (congestive) heart failure: Secondary | ICD-10-CM | POA: Diagnosis not present

## 2015-06-08 DIAGNOSIS — I4891 Unspecified atrial fibrillation: Secondary | ICD-10-CM | POA: Diagnosis not present

## 2015-06-08 DIAGNOSIS — W19XXXD Unspecified fall, subsequent encounter: Secondary | ICD-10-CM | POA: Diagnosis not present

## 2015-06-08 DIAGNOSIS — Z79891 Long term (current) use of opiate analgesic: Secondary | ICD-10-CM | POA: Diagnosis not present

## 2015-06-08 DIAGNOSIS — Z951 Presence of aortocoronary bypass graft: Secondary | ICD-10-CM | POA: Diagnosis not present

## 2015-06-08 DIAGNOSIS — R296 Repeated falls: Secondary | ICD-10-CM | POA: Diagnosis not present

## 2015-06-08 DIAGNOSIS — I2510 Atherosclerotic heart disease of native coronary artery without angina pectoris: Secondary | ICD-10-CM | POA: Diagnosis not present

## 2015-06-08 DIAGNOSIS — F329 Major depressive disorder, single episode, unspecified: Secondary | ICD-10-CM | POA: Diagnosis not present

## 2015-06-08 DIAGNOSIS — R42 Dizziness and giddiness: Secondary | ICD-10-CM | POA: Diagnosis not present

## 2015-06-08 DIAGNOSIS — I110 Hypertensive heart disease with heart failure: Secondary | ICD-10-CM | POA: Diagnosis not present

## 2015-06-11 DIAGNOSIS — I255 Ischemic cardiomyopathy: Secondary | ICD-10-CM | POA: Diagnosis not present

## 2015-06-11 DIAGNOSIS — I2510 Atherosclerotic heart disease of native coronary artery without angina pectoris: Secondary | ICD-10-CM | POA: Diagnosis not present

## 2015-06-11 DIAGNOSIS — R296 Repeated falls: Secondary | ICD-10-CM | POA: Diagnosis not present

## 2015-06-11 DIAGNOSIS — S32502D Unspecified fracture of left pubis, subsequent encounter for fracture with routine healing: Secondary | ICD-10-CM | POA: Diagnosis not present

## 2015-06-11 DIAGNOSIS — I5022 Chronic systolic (congestive) heart failure: Secondary | ICD-10-CM | POA: Diagnosis not present

## 2015-06-11 DIAGNOSIS — I110 Hypertensive heart disease with heart failure: Secondary | ICD-10-CM | POA: Diagnosis not present

## 2015-06-12 DIAGNOSIS — R296 Repeated falls: Secondary | ICD-10-CM | POA: Diagnosis not present

## 2015-06-12 DIAGNOSIS — S32502D Unspecified fracture of left pubis, subsequent encounter for fracture with routine healing: Secondary | ICD-10-CM | POA: Diagnosis not present

## 2015-06-12 DIAGNOSIS — I255 Ischemic cardiomyopathy: Secondary | ICD-10-CM | POA: Diagnosis not present

## 2015-06-12 DIAGNOSIS — I2510 Atherosclerotic heart disease of native coronary artery without angina pectoris: Secondary | ICD-10-CM | POA: Diagnosis not present

## 2015-06-12 DIAGNOSIS — I110 Hypertensive heart disease with heart failure: Secondary | ICD-10-CM | POA: Diagnosis not present

## 2015-06-12 DIAGNOSIS — I5022 Chronic systolic (congestive) heart failure: Secondary | ICD-10-CM | POA: Diagnosis not present

## 2015-06-13 DIAGNOSIS — M5418 Radiculopathy, sacral and sacrococcygeal region: Secondary | ICD-10-CM | POA: Diagnosis not present

## 2015-06-13 DIAGNOSIS — I5022 Chronic systolic (congestive) heart failure: Secondary | ICD-10-CM | POA: Diagnosis not present

## 2015-06-13 DIAGNOSIS — I110 Hypertensive heart disease with heart failure: Secondary | ICD-10-CM | POA: Diagnosis not present

## 2015-06-13 DIAGNOSIS — R296 Repeated falls: Secondary | ICD-10-CM | POA: Diagnosis not present

## 2015-06-13 DIAGNOSIS — M5416 Radiculopathy, lumbar region: Secondary | ICD-10-CM | POA: Diagnosis not present

## 2015-06-13 DIAGNOSIS — I255 Ischemic cardiomyopathy: Secondary | ICD-10-CM | POA: Diagnosis not present

## 2015-06-13 DIAGNOSIS — S32502D Unspecified fracture of left pubis, subsequent encounter for fracture with routine healing: Secondary | ICD-10-CM | POA: Diagnosis not present

## 2015-06-13 DIAGNOSIS — I2510 Atherosclerotic heart disease of native coronary artery without angina pectoris: Secondary | ICD-10-CM | POA: Diagnosis not present

## 2015-06-18 ENCOUNTER — Other Ambulatory Visit: Payer: Self-pay | Admitting: Physician Assistant

## 2015-06-18 DIAGNOSIS — S32502D Unspecified fracture of left pubis, subsequent encounter for fracture with routine healing: Secondary | ICD-10-CM | POA: Diagnosis not present

## 2015-06-18 DIAGNOSIS — I255 Ischemic cardiomyopathy: Secondary | ICD-10-CM | POA: Diagnosis not present

## 2015-06-18 DIAGNOSIS — I2510 Atherosclerotic heart disease of native coronary artery without angina pectoris: Secondary | ICD-10-CM | POA: Diagnosis not present

## 2015-06-18 DIAGNOSIS — I110 Hypertensive heart disease with heart failure: Secondary | ICD-10-CM | POA: Diagnosis not present

## 2015-06-18 DIAGNOSIS — R296 Repeated falls: Secondary | ICD-10-CM | POA: Diagnosis not present

## 2015-06-18 DIAGNOSIS — I5022 Chronic systolic (congestive) heart failure: Secondary | ICD-10-CM | POA: Diagnosis not present

## 2015-06-19 DIAGNOSIS — I255 Ischemic cardiomyopathy: Secondary | ICD-10-CM | POA: Diagnosis not present

## 2015-06-19 DIAGNOSIS — R296 Repeated falls: Secondary | ICD-10-CM | POA: Diagnosis not present

## 2015-06-19 DIAGNOSIS — I110 Hypertensive heart disease with heart failure: Secondary | ICD-10-CM | POA: Diagnosis not present

## 2015-06-19 DIAGNOSIS — I2510 Atherosclerotic heart disease of native coronary artery without angina pectoris: Secondary | ICD-10-CM | POA: Diagnosis not present

## 2015-06-19 DIAGNOSIS — I5022 Chronic systolic (congestive) heart failure: Secondary | ICD-10-CM | POA: Diagnosis not present

## 2015-06-19 DIAGNOSIS — S32502D Unspecified fracture of left pubis, subsequent encounter for fracture with routine healing: Secondary | ICD-10-CM | POA: Diagnosis not present

## 2015-06-20 DIAGNOSIS — I2510 Atherosclerotic heart disease of native coronary artery without angina pectoris: Secondary | ICD-10-CM | POA: Diagnosis not present

## 2015-06-20 DIAGNOSIS — I255 Ischemic cardiomyopathy: Secondary | ICD-10-CM | POA: Diagnosis not present

## 2015-06-20 DIAGNOSIS — S32502D Unspecified fracture of left pubis, subsequent encounter for fracture with routine healing: Secondary | ICD-10-CM | POA: Diagnosis not present

## 2015-06-20 DIAGNOSIS — I110 Hypertensive heart disease with heart failure: Secondary | ICD-10-CM | POA: Diagnosis not present

## 2015-06-20 DIAGNOSIS — I5022 Chronic systolic (congestive) heart failure: Secondary | ICD-10-CM | POA: Diagnosis not present

## 2015-06-20 DIAGNOSIS — R296 Repeated falls: Secondary | ICD-10-CM | POA: Diagnosis not present

## 2015-06-25 DIAGNOSIS — I255 Ischemic cardiomyopathy: Secondary | ICD-10-CM | POA: Diagnosis not present

## 2015-06-25 DIAGNOSIS — I2510 Atherosclerotic heart disease of native coronary artery without angina pectoris: Secondary | ICD-10-CM | POA: Diagnosis not present

## 2015-06-25 DIAGNOSIS — I110 Hypertensive heart disease with heart failure: Secondary | ICD-10-CM | POA: Diagnosis not present

## 2015-06-25 DIAGNOSIS — I5022 Chronic systolic (congestive) heart failure: Secondary | ICD-10-CM | POA: Diagnosis not present

## 2015-06-25 DIAGNOSIS — S32502D Unspecified fracture of left pubis, subsequent encounter for fracture with routine healing: Secondary | ICD-10-CM | POA: Diagnosis not present

## 2015-06-25 DIAGNOSIS — R296 Repeated falls: Secondary | ICD-10-CM | POA: Diagnosis not present

## 2015-06-27 DIAGNOSIS — R296 Repeated falls: Secondary | ICD-10-CM | POA: Diagnosis not present

## 2015-06-27 DIAGNOSIS — I110 Hypertensive heart disease with heart failure: Secondary | ICD-10-CM | POA: Diagnosis not present

## 2015-06-27 DIAGNOSIS — S32502D Unspecified fracture of left pubis, subsequent encounter for fracture with routine healing: Secondary | ICD-10-CM | POA: Diagnosis not present

## 2015-06-27 DIAGNOSIS — I255 Ischemic cardiomyopathy: Secondary | ICD-10-CM | POA: Diagnosis not present

## 2015-06-27 DIAGNOSIS — I5022 Chronic systolic (congestive) heart failure: Secondary | ICD-10-CM | POA: Diagnosis not present

## 2015-06-27 DIAGNOSIS — I2510 Atherosclerotic heart disease of native coronary artery without angina pectoris: Secondary | ICD-10-CM | POA: Diagnosis not present

## 2015-07-02 DIAGNOSIS — I255 Ischemic cardiomyopathy: Secondary | ICD-10-CM | POA: Diagnosis not present

## 2015-07-02 DIAGNOSIS — I5022 Chronic systolic (congestive) heart failure: Secondary | ICD-10-CM | POA: Diagnosis not present

## 2015-07-02 DIAGNOSIS — I63341 Cerebral infarction due to thrombosis of right cerebellar artery: Secondary | ICD-10-CM | POA: Diagnosis not present

## 2015-07-02 DIAGNOSIS — Z87898 Personal history of other specified conditions: Secondary | ICD-10-CM | POA: Diagnosis not present

## 2015-07-02 DIAGNOSIS — I110 Hypertensive heart disease with heart failure: Secondary | ICD-10-CM | POA: Diagnosis not present

## 2015-07-02 DIAGNOSIS — I2510 Atherosclerotic heart disease of native coronary artery without angina pectoris: Secondary | ICD-10-CM | POA: Diagnosis not present

## 2015-07-02 DIAGNOSIS — M4806 Spinal stenosis, lumbar region: Secondary | ICD-10-CM | POA: Diagnosis not present

## 2015-07-02 DIAGNOSIS — R296 Repeated falls: Secondary | ICD-10-CM | POA: Diagnosis not present

## 2015-07-02 DIAGNOSIS — S32502D Unspecified fracture of left pubis, subsequent encounter for fracture with routine healing: Secondary | ICD-10-CM | POA: Diagnosis not present

## 2015-07-02 DIAGNOSIS — G3184 Mild cognitive impairment, so stated: Secondary | ICD-10-CM | POA: Diagnosis not present

## 2015-07-11 DIAGNOSIS — N390 Urinary tract infection, site not specified: Secondary | ICD-10-CM | POA: Diagnosis not present

## 2015-07-25 DIAGNOSIS — R262 Difficulty in walking, not elsewhere classified: Secondary | ICD-10-CM | POA: Diagnosis not present

## 2015-07-25 DIAGNOSIS — I951 Orthostatic hypotension: Secondary | ICD-10-CM | POA: Diagnosis not present

## 2015-07-25 DIAGNOSIS — R42 Dizziness and giddiness: Secondary | ICD-10-CM | POA: Diagnosis not present

## 2015-07-25 DIAGNOSIS — R918 Other nonspecific abnormal finding of lung field: Secondary | ICD-10-CM | POA: Diagnosis not present

## 2015-07-25 DIAGNOSIS — R079 Chest pain, unspecified: Secondary | ICD-10-CM | POA: Diagnosis not present

## 2015-07-25 DIAGNOSIS — I208 Other forms of angina pectoris: Secondary | ICD-10-CM | POA: Diagnosis not present

## 2015-08-01 DIAGNOSIS — M4806 Spinal stenosis, lumbar region: Secondary | ICD-10-CM | POA: Diagnosis not present

## 2015-08-07 ENCOUNTER — Telehealth: Payer: Self-pay | Admitting: Cardiology

## 2015-08-07 ENCOUNTER — Ambulatory Visit (INDEPENDENT_AMBULATORY_CARE_PROVIDER_SITE_OTHER): Payer: Medicare Other | Admitting: *Deleted

## 2015-08-07 DIAGNOSIS — I442 Atrioventricular block, complete: Secondary | ICD-10-CM

## 2015-08-07 NOTE — Telephone Encounter (Signed)
Spoke with pt and reminded pt of remote transmission that is due today. Pt verbalized understanding.   

## 2015-08-08 LAB — CUP PACEART REMOTE DEVICE CHECK
Battery Remaining Longevity: 60 mo
Brady Statistic AP VP Percent: 10 %
Brady Statistic AS VS Percent: 1 %
Implantable Lead Implant Date: 20070720
Implantable Lead Location: 753858
Implantable Lead Location: 753859
Implantable Lead Location: 753860
Implantable Lead Model: 5076
Lead Channel Impedance Value: 550 Ohm
Lead Channel Impedance Value: 930 Ohm
Lead Channel Setting Pacing Amplitude: 2 V
Lead Channel Setting Pacing Amplitude: 2.5 V
Lead Channel Setting Pacing Amplitude: 3.125
Lead Channel Setting Pacing Pulse Width: 0.5 ms
Lead Channel Setting Sensing Sensitivity: 5 mV
MDC IDC LEAD IMPLANT DT: 20070720
MDC IDC LEAD IMPLANT DT: 20140602
MDC IDC MSMT BATTERY REMAINING PERCENTAGE: 95.5 %
MDC IDC MSMT BATTERY VOLTAGE: 2.96 V
MDC IDC MSMT LEADCHNL LV PACING THRESHOLD AMPLITUDE: 2.125 V
MDC IDC MSMT LEADCHNL LV PACING THRESHOLD PULSEWIDTH: 1 ms
MDC IDC MSMT LEADCHNL RA IMPEDANCE VALUE: 440 Ohm
MDC IDC MSMT LEADCHNL RA SENSING INTR AMPL: 1.9 mV
MDC IDC PG MODEL: 3242
MDC IDC SESS DTM: 20170216044128
MDC IDC SET LEADCHNL LV PACING PULSEWIDTH: 1 ms
MDC IDC STAT BRADY AP VS PERCENT: 1 %
MDC IDC STAT BRADY AS VP PERCENT: 90 %
MDC IDC STAT BRADY RA PERCENT PACED: 9.9 %
Pulse Gen Serial Number: 2960456

## 2015-08-08 NOTE — Progress Notes (Signed)
Remote pacemaker transmission.   

## 2015-08-20 DIAGNOSIS — R51 Headache: Secondary | ICD-10-CM | POA: Diagnosis not present

## 2015-08-20 DIAGNOSIS — Z7901 Long term (current) use of anticoagulants: Secondary | ICD-10-CM | POA: Diagnosis not present

## 2015-08-20 DIAGNOSIS — I255 Ischemic cardiomyopathy: Secondary | ICD-10-CM | POA: Diagnosis not present

## 2015-08-20 DIAGNOSIS — R262 Difficulty in walking, not elsewhere classified: Secondary | ICD-10-CM | POA: Diagnosis not present

## 2015-08-20 DIAGNOSIS — Z8719 Personal history of other diseases of the digestive system: Secondary | ICD-10-CM | POA: Diagnosis not present

## 2015-08-20 DIAGNOSIS — I2510 Atherosclerotic heart disease of native coronary artery without angina pectoris: Secondary | ICD-10-CM | POA: Diagnosis not present

## 2015-08-20 DIAGNOSIS — Z79891 Long term (current) use of opiate analgesic: Secondary | ICD-10-CM | POA: Diagnosis not present

## 2015-08-20 DIAGNOSIS — Z950 Presence of cardiac pacemaker: Secondary | ICD-10-CM | POA: Diagnosis not present

## 2015-08-20 DIAGNOSIS — I10 Essential (primary) hypertension: Secondary | ICD-10-CM | POA: Diagnosis not present

## 2015-08-20 DIAGNOSIS — M4806 Spinal stenosis, lumbar region: Secondary | ICD-10-CM | POA: Diagnosis not present

## 2015-08-20 DIAGNOSIS — E782 Mixed hyperlipidemia: Secondary | ICD-10-CM | POA: Diagnosis not present

## 2015-08-20 DIAGNOSIS — K219 Gastro-esophageal reflux disease without esophagitis: Secondary | ICD-10-CM | POA: Diagnosis not present

## 2015-08-20 DIAGNOSIS — R296 Repeated falls: Secondary | ICD-10-CM | POA: Diagnosis not present

## 2015-08-20 DIAGNOSIS — N393 Stress incontinence (female) (male): Secondary | ICD-10-CM | POA: Diagnosis not present

## 2015-08-20 DIAGNOSIS — G4733 Obstructive sleep apnea (adult) (pediatric): Secondary | ICD-10-CM | POA: Diagnosis not present

## 2015-08-20 DIAGNOSIS — Z8673 Personal history of transient ischemic attack (TIA), and cerebral infarction without residual deficits: Secondary | ICD-10-CM | POA: Diagnosis not present

## 2015-08-20 DIAGNOSIS — I5022 Chronic systolic (congestive) heart failure: Secondary | ICD-10-CM | POA: Diagnosis not present

## 2015-08-20 DIAGNOSIS — G309 Alzheimer's disease, unspecified: Secondary | ICD-10-CM | POA: Diagnosis not present

## 2015-08-20 DIAGNOSIS — F329 Major depressive disorder, single episode, unspecified: Secondary | ICD-10-CM | POA: Diagnosis not present

## 2015-08-20 DIAGNOSIS — D649 Anemia, unspecified: Secondary | ICD-10-CM | POA: Diagnosis not present

## 2015-08-20 DIAGNOSIS — Z951 Presence of aortocoronary bypass graft: Secondary | ICD-10-CM | POA: Diagnosis not present

## 2015-08-20 DIAGNOSIS — Z8679 Personal history of other diseases of the circulatory system: Secondary | ICD-10-CM | POA: Diagnosis not present

## 2015-08-23 ENCOUNTER — Other Ambulatory Visit: Payer: Self-pay | Admitting: Cardiovascular Disease

## 2015-08-23 NOTE — Telephone Encounter (Signed)
Rx request sent to pharmacy.  

## 2015-08-26 DIAGNOSIS — R262 Difficulty in walking, not elsewhere classified: Secondary | ICD-10-CM | POA: Diagnosis not present

## 2015-08-26 DIAGNOSIS — E860 Dehydration: Secondary | ICD-10-CM | POA: Diagnosis not present

## 2015-08-26 DIAGNOSIS — M5417 Radiculopathy, lumbosacral region: Secondary | ICD-10-CM | POA: Diagnosis not present

## 2015-08-26 DIAGNOSIS — I10 Essential (primary) hypertension: Secondary | ICD-10-CM | POA: Diagnosis not present

## 2015-08-26 DIAGNOSIS — M4806 Spinal stenosis, lumbar region: Secondary | ICD-10-CM | POA: Diagnosis not present

## 2015-08-28 DIAGNOSIS — R296 Repeated falls: Secondary | ICD-10-CM | POA: Diagnosis not present

## 2015-08-28 DIAGNOSIS — G4733 Obstructive sleep apnea (adult) (pediatric): Secondary | ICD-10-CM | POA: Diagnosis not present

## 2015-08-28 DIAGNOSIS — G309 Alzheimer's disease, unspecified: Secondary | ICD-10-CM | POA: Diagnosis not present

## 2015-08-28 DIAGNOSIS — I255 Ischemic cardiomyopathy: Secondary | ICD-10-CM | POA: Diagnosis not present

## 2015-08-28 DIAGNOSIS — M4806 Spinal stenosis, lumbar region: Secondary | ICD-10-CM | POA: Diagnosis not present

## 2015-08-28 DIAGNOSIS — R262 Difficulty in walking, not elsewhere classified: Secondary | ICD-10-CM | POA: Diagnosis not present

## 2015-09-04 ENCOUNTER — Encounter: Payer: Self-pay | Admitting: Cardiology

## 2015-09-10 ENCOUNTER — Ambulatory Visit: Payer: Medicare Other | Admitting: Cardiovascular Disease

## 2015-09-11 DIAGNOSIS — R296 Repeated falls: Secondary | ICD-10-CM | POA: Diagnosis not present

## 2015-09-11 DIAGNOSIS — R262 Difficulty in walking, not elsewhere classified: Secondary | ICD-10-CM | POA: Diagnosis not present

## 2015-09-11 DIAGNOSIS — M4806 Spinal stenosis, lumbar region: Secondary | ICD-10-CM | POA: Diagnosis not present

## 2015-09-11 DIAGNOSIS — I255 Ischemic cardiomyopathy: Secondary | ICD-10-CM | POA: Diagnosis not present

## 2015-09-11 DIAGNOSIS — G4733 Obstructive sleep apnea (adult) (pediatric): Secondary | ICD-10-CM | POA: Diagnosis not present

## 2015-09-11 DIAGNOSIS — G309 Alzheimer's disease, unspecified: Secondary | ICD-10-CM | POA: Diagnosis not present

## 2015-09-23 DIAGNOSIS — R1011 Right upper quadrant pain: Secondary | ICD-10-CM | POA: Diagnosis not present

## 2015-09-23 DIAGNOSIS — K591 Functional diarrhea: Secondary | ICD-10-CM | POA: Diagnosis not present

## 2015-09-25 ENCOUNTER — Other Ambulatory Visit: Payer: Self-pay | Admitting: Cardiovascular Disease

## 2015-09-25 NOTE — Telephone Encounter (Signed)
Rx(s) sent to pharmacy electronically.  

## 2015-10-04 ENCOUNTER — Other Ambulatory Visit: Payer: Self-pay

## 2015-10-04 NOTE — Patient Outreach (Signed)
[facility]) Care Management  10/04/2015  [name] December 10, 1930 [id number]   Telephone call to patient regarding Nurse Call line follow up.  Person answering phone identified himself as patients husband. States patient is resting now and request call back in 30 minutes.    PLAN:  RNCM will attempt 2nd telephone outreach to patient within 1 business day.  Quinn Plowman RN,BSN,CCM [facility] Telephonic  [phone]

## 2015-10-04 NOTE — Patient Outreach (Signed)
[facility]) Care Management  10/04/2015  TINLEIGH LEIFER [birth year] [id number]  SUBJECTIVE: Telephone call to patient regarding nurse advise line follow up.  HIPAA verified with patient.  Patient gave verbal permission to speak with her husband, Lunamarie Lyu regarding all of her medical information.  Spouse states he contacted patients primary MD doctor on yesterday due to patient showing signs of having a urinary tract infection. Spouse states patient was not seen but office requested he bring in a urine sample for testing. Spouse states due to results of urine sample patient was started on Cipro 250mg  on yesterday 10/03/15.  Patient states he wanted to know if someone could come out to the home to do IV fluids on patient. Spouse states he thinks patient is dehydrated.  Spouse states patient currently has [facility] home [facility] seeing patient for physical therapy only ordered by her neurologist, Dr. Metta Clines.  Spouse states patient has been in the hospital within the past 6 months due to urinary tract infection.  RNCM advised patients husband to contact primary MD office and notify them of his concern and request regarding IV fluids for patient. Advised husband that patient needs to be seen for follow up by the primary MD.  Spouse verbalizes understanding.  Spouse states patient is drinking fluids well.  States she is being seen by a gastroenterologist due to chronic diarrhea and nausea. Spouse states he has had to cancel some of patients follow up appointments due to her having diarrhea. Spouse states some days she may have 3-4 loose stools for 1-2 loose stools. Spouse states they are currently charting the stools she has and reporting it to the gastroenterologist, Dr. Lyda Jester.  Spouse states patient has heart failure and is being seen by Cardiologist, Dr. Ellouise Newer.  RNCM discussed and offered [facility] care management services to spouse for patient. Spouse declined services at this time.   Spouse verbally agreed to receiving [facility] care management letter/brochure to review and talk with patient about.  RNCM reinforced need for spouse to contact patients primary MD office to schedule appointment for patient and to discuss IV hydration. RNCM explained to spouse that patients doctor would have to approve of and write an order to home health for  IV hydration.  Spouse verbalized understanding.   ASSESSMENT: Nurse call advise line  PLAN:  RNCM will refer patient to Josepha Pigg to close patient due to refusal of [facility] care management services.  RNCM will send patient/spouse [facility] care management outreach letter and brochure. RNCM will send notification to patients primary MD of refusal of services.   Quinn Plowman RN,BSN,CCM [facility] Telephonic  [phone]

## 2015-10-08 DIAGNOSIS — M4806 Spinal stenosis, lumbar region: Secondary | ICD-10-CM | POA: Diagnosis not present

## 2015-10-08 DIAGNOSIS — R296 Repeated falls: Secondary | ICD-10-CM | POA: Diagnosis not present

## 2015-10-08 DIAGNOSIS — I255 Ischemic cardiomyopathy: Secondary | ICD-10-CM | POA: Diagnosis not present

## 2015-10-08 DIAGNOSIS — G4733 Obstructive sleep apnea (adult) (pediatric): Secondary | ICD-10-CM | POA: Diagnosis not present

## 2015-10-08 DIAGNOSIS — G309 Alzheimer's disease, unspecified: Secondary | ICD-10-CM | POA: Diagnosis not present

## 2015-10-08 DIAGNOSIS — R262 Difficulty in walking, not elsewhere classified: Secondary | ICD-10-CM | POA: Diagnosis not present

## 2015-10-23 DIAGNOSIS — K591 Functional diarrhea: Secondary | ICD-10-CM | POA: Diagnosis not present

## 2015-10-23 DIAGNOSIS — R1011 Right upper quadrant pain: Secondary | ICD-10-CM | POA: Diagnosis not present

## 2015-10-24 DIAGNOSIS — K591 Functional diarrhea: Secondary | ICD-10-CM | POA: Diagnosis not present

## 2015-11-06 ENCOUNTER — Telehealth: Payer: Self-pay | Admitting: Cardiology

## 2015-11-06 ENCOUNTER — Ambulatory Visit (INDEPENDENT_AMBULATORY_CARE_PROVIDER_SITE_OTHER): Payer: Medicare Other | Admitting: *Deleted

## 2015-11-06 DIAGNOSIS — I442 Atrioventricular block, complete: Secondary | ICD-10-CM

## 2015-11-06 NOTE — Telephone Encounter (Signed)
Confirmed remote transmission w/ pt husband.   

## 2015-11-06 NOTE — Progress Notes (Signed)
Remote pacemaker transmission.   

## 2015-11-07 DIAGNOSIS — Z79899 Other long term (current) drug therapy: Secondary | ICD-10-CM | POA: Diagnosis not present

## 2015-11-07 DIAGNOSIS — F419 Anxiety disorder, unspecified: Secondary | ICD-10-CM | POA: Diagnosis not present

## 2015-11-07 DIAGNOSIS — E782 Mixed hyperlipidemia: Secondary | ICD-10-CM | POA: Diagnosis not present

## 2015-11-07 DIAGNOSIS — R7309 Other abnormal glucose: Secondary | ICD-10-CM | POA: Diagnosis not present

## 2015-11-07 DIAGNOSIS — D649 Anemia, unspecified: Secondary | ICD-10-CM | POA: Diagnosis not present

## 2015-11-07 DIAGNOSIS — E559 Vitamin D deficiency, unspecified: Secondary | ICD-10-CM | POA: Diagnosis not present

## 2015-11-07 DIAGNOSIS — D519 Vitamin B12 deficiency anemia, unspecified: Secondary | ICD-10-CM | POA: Diagnosis not present

## 2015-11-07 NOTE — Telephone Encounter (Signed)
Patient's husband called wondering if transmission was received yesterday.  Advised that remote transmission was received.  He is appreciative and denies additional questions or concerns at this time.

## 2015-11-27 ENCOUNTER — Encounter: Payer: Self-pay | Admitting: Cardiology

## 2015-11-28 LAB — CUP PACEART REMOTE DEVICE CHECK
Battery Remaining Longevity: 64 mo
Brady Statistic AP VP Percent: 14 %
Brady Statistic AP VS Percent: 1 %
Brady Statistic AS VP Percent: 86 %
Brady Statistic AS VS Percent: 1 %
Date Time Interrogation Session: 20170517155117
Implantable Lead Implant Date: 20070720
Implantable Lead Implant Date: 20140602
Implantable Lead Model: 5076
Lead Channel Impedance Value: 430 Ohm
Lead Channel Impedance Value: 590 Ohm
Lead Channel Sensing Intrinsic Amplitude: 2.2 mV
Lead Channel Setting Pacing Amplitude: 2 V
Lead Channel Setting Pacing Amplitude: 2.5 V
Lead Channel Setting Pacing Amplitude: 3.625
Lead Channel Setting Pacing Pulse Width: 1 ms
MDC IDC LEAD IMPLANT DT: 20070720
MDC IDC LEAD LOCATION: 753858
MDC IDC LEAD LOCATION: 753859
MDC IDC LEAD LOCATION: 753860
MDC IDC MSMT BATTERY REMAINING PERCENTAGE: 95.5 %
MDC IDC MSMT BATTERY VOLTAGE: 2.96 V
MDC IDC MSMT LEADCHNL LV IMPEDANCE VALUE: 910 Ohm
MDC IDC MSMT LEADCHNL LV PACING THRESHOLD AMPLITUDE: 2.625 V
MDC IDC MSMT LEADCHNL LV PACING THRESHOLD PULSEWIDTH: 1 ms
MDC IDC PG MODEL: 3242
MDC IDC PG SERIAL: 2960456
MDC IDC SET LEADCHNL RV PACING PULSEWIDTH: 0.5 ms
MDC IDC SET LEADCHNL RV SENSING SENSITIVITY: 5 mV
MDC IDC STAT BRADY RA PERCENT PACED: 14 %

## 2015-12-26 DIAGNOSIS — R079 Chest pain, unspecified: Secondary | ICD-10-CM | POA: Diagnosis not present

## 2015-12-26 DIAGNOSIS — E860 Dehydration: Secondary | ICD-10-CM | POA: Diagnosis not present

## 2015-12-26 DIAGNOSIS — S060X9A Concussion with loss of consciousness of unspecified duration, initial encounter: Secondary | ICD-10-CM | POA: Diagnosis not present

## 2015-12-26 DIAGNOSIS — R410 Disorientation, unspecified: Secondary | ICD-10-CM | POA: Diagnosis not present

## 2015-12-26 DIAGNOSIS — S0990XA Unspecified injury of head, initial encounter: Secondary | ICD-10-CM | POA: Diagnosis not present

## 2015-12-27 DIAGNOSIS — R079 Chest pain, unspecified: Secondary | ICD-10-CM | POA: Diagnosis not present

## 2015-12-27 DIAGNOSIS — S0990XA Unspecified injury of head, initial encounter: Secondary | ICD-10-CM | POA: Diagnosis not present

## 2015-12-27 DIAGNOSIS — R410 Disorientation, unspecified: Secondary | ICD-10-CM | POA: Diagnosis not present

## 2016-01-24 DIAGNOSIS — Z9181 History of falling: Secondary | ICD-10-CM | POA: Diagnosis not present

## 2016-01-24 DIAGNOSIS — R262 Difficulty in walking, not elsewhere classified: Secondary | ICD-10-CM | POA: Diagnosis not present

## 2016-01-24 DIAGNOSIS — R61 Generalized hyperhidrosis: Secondary | ICD-10-CM | POA: Diagnosis not present

## 2016-02-12 DIAGNOSIS — R262 Difficulty in walking, not elsewhere classified: Secondary | ICD-10-CM | POA: Diagnosis not present

## 2016-02-12 DIAGNOSIS — M6281 Muscle weakness (generalized): Secondary | ICD-10-CM | POA: Diagnosis not present

## 2016-02-14 ENCOUNTER — Encounter: Payer: Self-pay | Admitting: Internal Medicine

## 2016-02-14 ENCOUNTER — Ambulatory Visit (INDEPENDENT_AMBULATORY_CARE_PROVIDER_SITE_OTHER): Payer: Medicare Other | Admitting: Internal Medicine

## 2016-02-14 VITALS — BP 122/60 | HR 66 | Ht 63.0 in | Wt 132.0 lb

## 2016-02-14 DIAGNOSIS — I255 Ischemic cardiomyopathy: Secondary | ICD-10-CM | POA: Diagnosis not present

## 2016-02-14 DIAGNOSIS — I480 Paroxysmal atrial fibrillation: Secondary | ICD-10-CM | POA: Diagnosis not present

## 2016-02-14 DIAGNOSIS — I495 Sick sinus syndrome: Secondary | ICD-10-CM | POA: Diagnosis not present

## 2016-02-14 DIAGNOSIS — Z950 Presence of cardiac pacemaker: Secondary | ICD-10-CM

## 2016-02-14 DIAGNOSIS — I5022 Chronic systolic (congestive) heart failure: Secondary | ICD-10-CM

## 2016-02-14 NOTE — Patient Instructions (Signed)
Medication Instructions: - Please call to clarify your medications that you are taking at home.  Labwork: - none  Procedures/Testing: - none  Follow-Up: - Remote monitoring is used to monitor your Pacemaker of ICD from home. This monitoring reduces the number of office visits required to check your device to one time per year. It allows [place] to keep an eye on the functioning of your device to ensure it is working properly. You are scheduled for a device check from home on 05/19/16. You may send your transmission at any time that day. If you have a wireless device, the transmission will be sent automatically. After your physician reviews your transmission, you will receive a postcard with your next transmission date.  - Your physician wants you to follow-up in: 1 year with Dr. Caryl Comes. You will receive a reminder letter in the mail two months in advance. If you don't receive a letter, please call our office to schedule the follow-up appointment.    Any Additional Special Instructions Will Be Listed Below (If Applicable). - abdominal girdle/ spandex from an athletic store.   If you need a refill on your cardiac medications before your next appointment, please call your pharmacy.

## 2016-02-14 NOTE — Progress Notes (Signed)
Patient Care Team: Myrlene Broker, MD as PCP - General (Family Medicine)   HPI  [name] is a 82 y.o. female Seen in followup for CRT-P. Upgrade accomplished 6/14.  Device was originally implanted for sick sinus syndrome now w complete Heart block and device dependent.  There was no  appreciable change in functional status  She has hx of coronary artery disease with bypass graft in 2003. She had undergone prior stenting. She had mitral valve repair at that time. Because of recurring chest pain in 2012 she underwent catheterization demonstrated a patent LIMA and patent vein graft to the ramus a  large and patent right   She has  a catheterization 2013-December demonstrating 80-85% left main and 70% ostial LAD at the point of an old stent in the ramus occlusion and a large dominant right. Ejection fraction was 35-40% repeat catheterization 05/2013 demonstrated normalization of systolic function  She has afib and is now on apixoban   She has had multiple falls. There've been 17 over the last 9 months. She has fractured her pelvis on one occasion and had a concussion on the wound. They're now supporting her as she ambulates. Her blood pressures are notably low and over the last year blood pressures 90--122 have been recorded. She is on 23 blood pressure meds She has not had significant edema. Her shortness of breath is relatively stable.  Neurological notes  were reviewed from the fall. She has been diagnosed with spinal stenosis. There is issues of memory impairment. She's had significant pain treated with gabapentin.  She's also had multiple falls with significant trauma.  She has not had any spells while seated. Efforts to improve muscle strength. described taking a few steps and then being prone to falling.   Past Medical History:  Diagnosis Date  . Anemia 1940's  . Anginal pain ([place])   . CHF (congestive heart failure) (HCC)    hx  . Chronic anticoagulation    apixaban   . Chronic systolic heart failure ([place]) 10/11/2012  . Complete heart block ([place]) 10/11/2012  . Coronary artery disease   . Depression   . GERD (gastroesophageal reflux disease)   . H/O hiatal hernia   . History of blood transfusion    "related to uretheral OR & when 1st child was born"  . Hyperlipidemia   . Hypertension   . Hypothyroidism   . Ischemic cardiomyopathy- s/p CABG 10/11/2012   EF 39%  . Migraine    ~ qd for 11 months now (05/23/2014)  . OSA on CPAP 10/11/2012   No longer using CPAP due to worsening of Migraines. AHI was .45/hr at 9 cm water pressure. RDI was 0.4/hr. Patient was unable to reach REM sleep and maintain the supine position at optimal pressure. Patient was able to sleep for prolonged periods of time at 9 cm water pressure without suffering further from respiratory events.  [name] 10/11/2012   Updated to BiV  . Shortness of breath   . Stress incontinence    multiple injections of collagen  . Thrombocytopenia ([place])   . Weakness 10/06/12    Past Surgical History:  Procedure Laterality Date  . APPENDECTOMY    . BI-VENTRICULAR PACEMAKER REVISION (CRT-R)  11/21/2012   BIV   UPGRADE    WITH DR Caryl Comes  . BI-VENTRICULAR PACEMAKER UPGRADE N/A 10/31/2012   Procedure: BI-VENTRICULAR PACEMAKER UPGRADE;  Surgeon: Deboraha Sprang, MD;  Location: [facility] CATH LAB;  Service: Cardiovascular;  Laterality: N/A;  . BI-VENTRICULAR PACEMAKER UPGRADE N/A 11/21/2012   Procedure: BI-VENTRICULAR PACEMAKER UPGRADE;  Surgeon: Deboraha Sprang, MD;  Location: [facility] CATH LAB;  Service: Cardiovascular;  Laterality: N/A;  . CARDIAC CATHETERIZATION  05/2012   (1st cath 2012) This required recatheterization by Dr. Claiborne Billings and had a patent LIMA-LAD with distal diffuse disease, patent SVG-OM, 10% to 20% mid right coronary but an EF of about 35%, quantitatively iw was 38.9.  [name] CARDIAC CATHETERIZATION     "today;s makes 63" (05/23/2014)  . CARDIAC VALVE REPLACEMENT    . CATARACT EXTRACTION, BILATERAL  Bilateral   . CHOLECYSTECTOMY    . CORONARY ANGIOPLASTY WITH STENT PLACEMENT  2012   No significant CAD, patent but ectatic graft supplying the ramus, patent LIMA-LAD, and an EF of 50% at that time. She had an occluded stent to her circumflex from remote intervention. Required recatheterization 05/2012 by Dr. Claiborne Billings and had a patent LIMA-LAD with distal diffuse disease, patent SVG-OM, 10%-20% mid right coronary but an EF of about 35%, quantitatively it was 38.9.  . CORONARY ANGIOPLASTY WITH STENT PLACEMENT     "I have a tota of 4" (05/23/2014)  . CORONARY ARTERY BYPASS GRAFT  2003   With LIMA-LAD and vein graft to OM, and associated mitral valve repair with anuloplasty ring and quadrangular resection of the posterior leaflet, 24 mm Seguin ring by Dr. Roxy Manns.  . INCONTINENCE SURGERY    . INSERT / REPLACE / REMOVE PACEMAKER  2007  . LEFT HEART CATHETERIZATION WITH CORONARY ANGIOGRAM N/A 06/07/2013   Procedure: LEFT HEART CATHETERIZATION WITH CORONARY ANGIOGRAM;  Surgeon: Lorretta Harp, MD;  Location: [facility] CATH LAB;  Service: Cardiovascular;  Laterality: N/A;  . LEFT HEART CATHETERIZATION WITH CORONARY/GRAFT ANGIOGRAM N/A 05/23/2012   Procedure: LEFT HEART CATHETERIZATION WITH Beatrix Fetters;  Surgeon: Troy Sine, MD;  Location: [facility] C[facility] LAB;  Service: Cardiovascular;  Laterality: N/A;  . LEFT HEART CATHETERIZATION WITH CORONARY/GRAFT ANGIOGRAM N/A 05/23/2014   Procedure: LEFT HEART CATHETERIZATION WITH Beatrix Fetters;  Surgeon: Wellington Hampshire, MD;  Location: [facility] CATH LAB;  Service: Cardiovascular;  Laterality: N/A;  . lexiscan myocardial stress test  08/20/11   The EF = 34%. This is a low risk scan. There is a new fix abnormality in the distal LAD artery territory. Although no reversibility is seen, high grade obstruction with resting ischemia cannot be excluded.  [name] PACEMAKER GENERATOR CHANGE    . US ECHOCARDIOGRAPHY  04/13/12   EF - 25% to 35% with intraventricular dyssynchrony,  RVSP of 46, moderate TR, and mild to moderate AI. She had a mild increased mitral valve gradient of 5 mmHg with minimal MR.   [name] VAGINAL HYSTERECTOMY      Current Outpatient Prescriptions  Medication Sig Dispense Refill  . amLODipine (NORVASC) 5 MG tablet Take 5 mg by mouth daily.  11  . atorvastatin (LIPITOR) 20 MG tablet Take 1 tablet (20 mg total) by mouth daily at 6 PM. 30 tablet 11  . buPROPion (WELLBUTRIN XL) 150 MG 24 hr tablet Take 150 mg by mouth at bedtime.     . Calcium Carbonate-Vitamin D (CALCIUM 600/VITAMIN D) 600-400 MG-UNIT per tablet Take 1 tablet by mouth daily.    [name] ELIQUIS 2.5 MG TABS tablet TAKE 1 TABLET TWICE DAILY 60 tablet 6  . ergocalciferol (VITAMIN D2) 50000 UNITS capsule Take 50,000 Units by mouth once a week.    . furosemide (LASIX) 20 MG tablet Take 1 tablet (20 mg total) by mouth daily.  30 tablet 7  . nebivolol (BYSTOLIC) 5 MG tablet Take 1 tablet (5 mg total) by mouth at bedtime. 30 tablet 5  . nebivolol (BYSTOLIC) 5 MG tablet Take 1 tablet (5 mg total) by mouth at bedtime. 30 tablet 7  . NITROSTAT 0.4 MG SL tablet PLACE 1 TAB UNDER TONGUE EVERY 5 MINUTES UP TO 3 DOSES FOR CHEST PAIN 25 tablet 2  . PARoxetine (PAXIL) 30 MG tablet Take 30 mg by mouth every evening.    . potassium chloride (K-DUR) 10 MEQ tablet Take 10 mEq by mouth as needed (with lasix, fulid, edema).     . valsartan (DIOVAN) 80 MG tablet Take 0.5 tablets (40 mg total) by mouth 2 (two) times daily. 30 tablet 5  . valsartan-hydrochlorothiazide (DIOVAN-HCT) 320-25 MG per tablet TAKE 1 TABLET BY MOUTH DAILY. 90 tablet 3   No current facility-administered medications for this visit.     Allergies  Allergen Reactions  . Dilaudid [Hydromorphone Hcl] Other (See Comments)    "jerking"  . Morphine And Related Other (See Comments)    "jerking"  . Sulfa Antibiotics Other (See Comments)    unknown    Review of Systems negative except from HPI and PMH  Physical Exam BP 122/60   Pulse 66   Ht 5'  3" (1.6 m)   Wt 132 lb (59.9 kg)   SpO2 95%   BMI 23.38 kg/m  Well developed and well nourished in no acute distress HENT normal E scleral and icterus clear Neck Supple JVP 6-8 cm; carotids brisk and full Clear to ausculation   Regular   frate and rhythm, no murmurs gallops or rub Soft with active bowel sounds No clubbing cyanosis  Edema Alert and oriented, grossly normal motor and sensory function Skin Warm and Dry  ECG demonstrates sinus rhythm with a synchronous pacing   Assessment and  Plan  Coronary disease with prior bypass surgery  Atrial fibrillation detected on her device  Sick sinus syndrome  Pacemaker-[employer] The patient's device was interrogated.  The information was reviewed. No changes were made in the programming.    Congestive heart failure-chronic-systolic  Falls--Orthostatic hypotension\  Without symptoms of ischemia  Euvolemic continue current meds  I suspect her falls are related to orthostatic hypotension which is objectively demonstrated today. She has systolic supine hypertension so decreasing her antihypertensives is not ideal. The alternatives include compression devices including an abdominal binder and/or spandex by compression. If these are insufficient ProAmatine and/or Mestinon can be used adjunctively. So as to avoid side effects, we will start with the former.  It is I think reasonable and important to continue her on anticoagulation. Interrogated to suggest that falls need to be in range of 100 per year to mitigate the benefits of anticoagulation and high risk patients

## 2016-02-21 LAB — CUP PACEART INCLINIC DEVICE CHECK
Battery Remaining Longevity: 61.2
Battery Voltage: 2.96 V
Brady Statistic RA Percent Paced: 18 %
Implantable Lead Implant Date: 20070720
Implantable Lead Model: 5076
Lead Channel Pacing Threshold Pulse Width: 1 ms
Lead Channel Sensing Intrinsic Amplitude: 2 mV
Lead Channel Setting Pacing Amplitude: 2.5 V
Lead Channel Setting Pacing Amplitude: 2.875
Lead Channel Setting Pacing Pulse Width: 0.5 ms
Lead Channel Setting Pacing Pulse Width: 1 ms
MDC IDC LEAD IMPLANT DT: 20070720
MDC IDC LEAD IMPLANT DT: 20140602
MDC IDC LEAD LOCATION: 753858
MDC IDC LEAD LOCATION: 753859
MDC IDC LEAD LOCATION: 753860
MDC IDC MSMT LEADCHNL LV PACING THRESHOLD AMPLITUDE: 1.875 V
MDC IDC MSMT LEADCHNL RA PACING THRESHOLD AMPLITUDE: 0.5 V
MDC IDC MSMT LEADCHNL RA PACING THRESHOLD PULSEWIDTH: 0.4 ms
MDC IDC MSMT LEADCHNL RV PACING THRESHOLD AMPLITUDE: 0.5 V
MDC IDC MSMT LEADCHNL RV PACING THRESHOLD PULSEWIDTH: 0.5 ms
MDC IDC PG MODEL: 3242
MDC IDC PG SERIAL: 2960456
MDC IDC SESS DTM: 20170825184414
MDC IDC SET LEADCHNL RA PACING AMPLITUDE: 2 V
MDC IDC SET LEADCHNL RV SENSING SENSITIVITY: 5 mV
MDC IDC STAT BRADY RV PERCENT PACED: 99.97 %

## 2016-02-25 DIAGNOSIS — R32 Unspecified urinary incontinence: Secondary | ICD-10-CM | POA: Diagnosis not present

## 2016-02-25 DIAGNOSIS — F99 Mental disorder, not otherwise specified: Secondary | ICD-10-CM | POA: Diagnosis not present

## 2016-02-27 DIAGNOSIS — R262 Difficulty in walking, not elsewhere classified: Secondary | ICD-10-CM | POA: Diagnosis not present

## 2016-02-27 DIAGNOSIS — M6281 Muscle weakness (generalized): Secondary | ICD-10-CM | POA: Diagnosis not present

## 2016-03-05 DIAGNOSIS — R51 Headache: Secondary | ICD-10-CM | POA: Diagnosis not present

## 2016-03-05 DIAGNOSIS — S79911A Unspecified injury of right hip, initial encounter: Secondary | ICD-10-CM | POA: Diagnosis not present

## 2016-03-05 DIAGNOSIS — S79912A Unspecified injury of left hip, initial encounter: Secondary | ICD-10-CM | POA: Diagnosis not present

## 2016-03-05 DIAGNOSIS — S72002A Fracture of unspecified part of neck of left femur, initial encounter for closed fracture: Secondary | ICD-10-CM | POA: Diagnosis not present

## 2016-03-05 DIAGNOSIS — S299XXA Unspecified injury of thorax, initial encounter: Secondary | ICD-10-CM | POA: Diagnosis not present

## 2016-03-05 DIAGNOSIS — S72012A Unspecified intracapsular fracture of left femur, initial encounter for closed fracture: Secondary | ICD-10-CM | POA: Diagnosis not present

## 2016-03-05 DIAGNOSIS — M25552 Pain in left hip: Secondary | ICD-10-CM | POA: Diagnosis not present

## 2016-03-05 DIAGNOSIS — R079 Chest pain, unspecified: Secondary | ICD-10-CM | POA: Diagnosis not present

## 2016-03-05 DIAGNOSIS — S7002XA Contusion of left hip, initial encounter: Secondary | ICD-10-CM | POA: Diagnosis not present

## 2016-03-06 DIAGNOSIS — S72012A Unspecified intracapsular fracture of left femur, initial encounter for closed fracture: Secondary | ICD-10-CM | POA: Diagnosis not present

## 2016-03-06 DIAGNOSIS — D649 Anemia, unspecified: Secondary | ICD-10-CM | POA: Diagnosis not present

## 2016-03-06 DIAGNOSIS — Z950 Presence of cardiac pacemaker: Secondary | ICD-10-CM | POA: Diagnosis not present

## 2016-03-06 DIAGNOSIS — R2689 Other abnormalities of gait and mobility: Secondary | ICD-10-CM | POA: Diagnosis not present

## 2016-03-06 DIAGNOSIS — Z7901 Long term (current) use of anticoagulants: Secondary | ICD-10-CM | POA: Diagnosis not present

## 2016-03-06 DIAGNOSIS — I252 Old myocardial infarction: Secondary | ICD-10-CM | POA: Diagnosis not present

## 2016-03-06 DIAGNOSIS — R259 Unspecified abnormal involuntary movements: Secondary | ICD-10-CM | POA: Diagnosis not present

## 2016-03-06 DIAGNOSIS — R262 Difficulty in walking, not elsewhere classified: Secondary | ICD-10-CM | POA: Diagnosis not present

## 2016-03-06 DIAGNOSIS — R5381 Other malaise: Secondary | ICD-10-CM | POA: Diagnosis present

## 2016-03-06 DIAGNOSIS — S79911A Unspecified injury of right hip, initial encounter: Secondary | ICD-10-CM | POA: Diagnosis not present

## 2016-03-06 DIAGNOSIS — Z9181 History of falling: Secondary | ICD-10-CM | POA: Diagnosis not present

## 2016-03-06 DIAGNOSIS — Z79899 Other long term (current) drug therapy: Secondary | ICD-10-CM | POA: Diagnosis not present

## 2016-03-06 DIAGNOSIS — E039 Hypothyroidism, unspecified: Secondary | ICD-10-CM | POA: Diagnosis present

## 2016-03-06 DIAGNOSIS — S728X2A Other fracture of left femur, initial encounter for closed fracture: Secondary | ICD-10-CM | POA: Diagnosis not present

## 2016-03-06 DIAGNOSIS — Z8673 Personal history of transient ischemic attack (TIA), and cerebral infarction without residual deficits: Secondary | ICD-10-CM | POA: Diagnosis not present

## 2016-03-06 DIAGNOSIS — S7002XA Contusion of left hip, initial encounter: Secondary | ICD-10-CM | POA: Diagnosis not present

## 2016-03-06 DIAGNOSIS — W19XXXD Unspecified fall, subsequent encounter: Secondary | ICD-10-CM | POA: Diagnosis not present

## 2016-03-06 DIAGNOSIS — R079 Chest pain, unspecified: Secondary | ICD-10-CM | POA: Diagnosis not present

## 2016-03-06 DIAGNOSIS — Z951 Presence of aortocoronary bypass graft: Secondary | ICD-10-CM | POA: Diagnosis not present

## 2016-03-06 DIAGNOSIS — R42 Dizziness and giddiness: Secondary | ICD-10-CM | POA: Diagnosis not present

## 2016-03-06 DIAGNOSIS — S79912A Unspecified injury of left hip, initial encounter: Secondary | ICD-10-CM | POA: Diagnosis not present

## 2016-03-06 DIAGNOSIS — R531 Weakness: Secondary | ICD-10-CM | POA: Diagnosis present

## 2016-03-06 DIAGNOSIS — S7292XD Unspecified fracture of left femur, subsequent encounter for closed fracture with routine healing: Secondary | ICD-10-CM | POA: Diagnosis not present

## 2016-03-06 DIAGNOSIS — I10 Essential (primary) hypertension: Secondary | ICD-10-CM | POA: Diagnosis not present

## 2016-03-06 DIAGNOSIS — R51 Headache: Secondary | ICD-10-CM | POA: Diagnosis not present

## 2016-03-06 DIAGNOSIS — S299XXA Unspecified injury of thorax, initial encounter: Secondary | ICD-10-CM | POA: Diagnosis not present

## 2016-03-06 DIAGNOSIS — I951 Orthostatic hypotension: Secondary | ICD-10-CM | POA: Diagnosis present

## 2016-03-06 DIAGNOSIS — M25552 Pain in left hip: Secondary | ICD-10-CM | POA: Diagnosis not present

## 2016-03-06 DIAGNOSIS — M85862 Other specified disorders of bone density and structure, left lower leg: Secondary | ICD-10-CM | POA: Diagnosis present

## 2016-03-06 DIAGNOSIS — I4891 Unspecified atrial fibrillation: Secondary | ICD-10-CM | POA: Diagnosis present

## 2016-03-06 DIAGNOSIS — S72002A Fracture of unspecified part of neck of left femur, initial encounter for closed fracture: Secondary | ICD-10-CM | POA: Diagnosis not present

## 2016-03-08 DIAGNOSIS — S72002A Fracture of unspecified part of neck of left femur, initial encounter for closed fracture: Secondary | ICD-10-CM | POA: Diagnosis not present

## 2016-03-10 DIAGNOSIS — R531 Weakness: Secondary | ICD-10-CM | POA: Diagnosis not present

## 2016-03-10 DIAGNOSIS — I4891 Unspecified atrial fibrillation: Secondary | ICD-10-CM | POA: Diagnosis not present

## 2016-03-10 DIAGNOSIS — M439 Deforming dorsopathy, unspecified: Secondary | ICD-10-CM | POA: Diagnosis not present

## 2016-03-10 DIAGNOSIS — G8918 Other acute postprocedural pain: Secondary | ICD-10-CM | POA: Diagnosis not present

## 2016-03-10 DIAGNOSIS — I10 Essential (primary) hypertension: Secondary | ICD-10-CM | POA: Diagnosis not present

## 2016-03-10 DIAGNOSIS — S72012D Unspecified intracapsular fracture of left femur, subsequent encounter for closed fracture with routine healing: Secondary | ICD-10-CM | POA: Diagnosis not present

## 2016-03-10 DIAGNOSIS — S7292XD Unspecified fracture of left femur, subsequent encounter for closed fracture with routine healing: Secondary | ICD-10-CM | POA: Diagnosis not present

## 2016-03-10 DIAGNOSIS — R262 Difficulty in walking, not elsewhere classified: Secondary | ICD-10-CM | POA: Diagnosis not present

## 2016-03-10 DIAGNOSIS — F0391 Unspecified dementia with behavioral disturbance: Secondary | ICD-10-CM | POA: Diagnosis not present

## 2016-03-10 DIAGNOSIS — W19XXXD Unspecified fall, subsequent encounter: Secondary | ICD-10-CM | POA: Diagnosis not present

## 2016-03-10 DIAGNOSIS — R42 Dizziness and giddiness: Secondary | ICD-10-CM | POA: Diagnosis not present

## 2016-03-10 DIAGNOSIS — R29898 Other symptoms and signs involving the musculoskeletal system: Secondary | ICD-10-CM | POA: Diagnosis not present

## 2016-03-12 DIAGNOSIS — M439 Deforming dorsopathy, unspecified: Secondary | ICD-10-CM | POA: Diagnosis not present

## 2016-03-12 DIAGNOSIS — G8918 Other acute postprocedural pain: Secondary | ICD-10-CM | POA: Diagnosis not present

## 2016-03-12 DIAGNOSIS — R29898 Other symptoms and signs involving the musculoskeletal system: Secondary | ICD-10-CM | POA: Diagnosis not present

## 2016-03-12 DIAGNOSIS — F0391 Unspecified dementia with behavioral disturbance: Secondary | ICD-10-CM | POA: Diagnosis not present

## 2016-03-24 DIAGNOSIS — S72012D Unspecified intracapsular fracture of left femur, subsequent encounter for closed fracture with routine healing: Secondary | ICD-10-CM | POA: Diagnosis not present

## 2016-04-09 DIAGNOSIS — I110 Hypertensive heart disease with heart failure: Secondary | ICD-10-CM | POA: Diagnosis not present

## 2016-04-09 DIAGNOSIS — G309 Alzheimer's disease, unspecified: Secondary | ICD-10-CM | POA: Diagnosis not present

## 2016-04-09 DIAGNOSIS — Z950 Presence of cardiac pacemaker: Secondary | ICD-10-CM | POA: Diagnosis not present

## 2016-04-09 DIAGNOSIS — F329 Major depressive disorder, single episode, unspecified: Secondary | ICD-10-CM | POA: Diagnosis not present

## 2016-04-09 DIAGNOSIS — I480 Paroxysmal atrial fibrillation: Secondary | ICD-10-CM | POA: Diagnosis not present

## 2016-04-09 DIAGNOSIS — Z951 Presence of aortocoronary bypass graft: Secondary | ICD-10-CM | POA: Diagnosis not present

## 2016-04-09 DIAGNOSIS — F419 Anxiety disorder, unspecified: Secondary | ICD-10-CM | POA: Diagnosis not present

## 2016-04-09 DIAGNOSIS — I255 Ischemic cardiomyopathy: Secondary | ICD-10-CM | POA: Diagnosis not present

## 2016-04-09 DIAGNOSIS — Z7901 Long term (current) use of anticoagulants: Secondary | ICD-10-CM | POA: Diagnosis not present

## 2016-04-09 DIAGNOSIS — I2510 Atherosclerotic heart disease of native coronary artery without angina pectoris: Secondary | ICD-10-CM | POA: Diagnosis not present

## 2016-04-09 DIAGNOSIS — W19XXXD Unspecified fall, subsequent encounter: Secondary | ICD-10-CM | POA: Diagnosis not present

## 2016-04-09 DIAGNOSIS — F0280 Dementia in other diseases classified elsewhere without behavioral disturbance: Secondary | ICD-10-CM | POA: Diagnosis not present

## 2016-04-09 DIAGNOSIS — I951 Orthostatic hypotension: Secondary | ICD-10-CM | POA: Diagnosis not present

## 2016-04-09 DIAGNOSIS — S72002D Fracture of unspecified part of neck of left femur, subsequent encounter for closed fracture with routine healing: Secondary | ICD-10-CM | POA: Diagnosis not present

## 2016-04-09 DIAGNOSIS — I5022 Chronic systolic (congestive) heart failure: Secondary | ICD-10-CM | POA: Diagnosis not present

## 2016-04-10 DIAGNOSIS — S72002D Fracture of unspecified part of neck of left femur, subsequent encounter for closed fracture with routine healing: Secondary | ICD-10-CM | POA: Diagnosis not present

## 2016-04-10 DIAGNOSIS — W19XXXD Unspecified fall, subsequent encounter: Secondary | ICD-10-CM | POA: Diagnosis not present

## 2016-04-10 DIAGNOSIS — I110 Hypertensive heart disease with heart failure: Secondary | ICD-10-CM | POA: Diagnosis not present

## 2016-04-10 DIAGNOSIS — I5022 Chronic systolic (congestive) heart failure: Secondary | ICD-10-CM | POA: Diagnosis not present

## 2016-04-10 DIAGNOSIS — F0280 Dementia in other diseases classified elsewhere without behavioral disturbance: Secondary | ICD-10-CM | POA: Diagnosis not present

## 2016-04-10 DIAGNOSIS — G309 Alzheimer's disease, unspecified: Secondary | ICD-10-CM | POA: Diagnosis not present

## 2016-04-14 DIAGNOSIS — W19XXXD Unspecified fall, subsequent encounter: Secondary | ICD-10-CM | POA: Diagnosis not present

## 2016-04-14 DIAGNOSIS — I5022 Chronic systolic (congestive) heart failure: Secondary | ICD-10-CM | POA: Diagnosis not present

## 2016-04-14 DIAGNOSIS — G309 Alzheimer's disease, unspecified: Secondary | ICD-10-CM | POA: Diagnosis not present

## 2016-04-14 DIAGNOSIS — F0280 Dementia in other diseases classified elsewhere without behavioral disturbance: Secondary | ICD-10-CM | POA: Diagnosis not present

## 2016-04-14 DIAGNOSIS — I110 Hypertensive heart disease with heart failure: Secondary | ICD-10-CM | POA: Diagnosis not present

## 2016-04-14 DIAGNOSIS — S72002D Fracture of unspecified part of neck of left femur, subsequent encounter for closed fracture with routine healing: Secondary | ICD-10-CM | POA: Diagnosis not present

## 2016-04-15 DIAGNOSIS — I5022 Chronic systolic (congestive) heart failure: Secondary | ICD-10-CM | POA: Diagnosis not present

## 2016-04-15 DIAGNOSIS — F0280 Dementia in other diseases classified elsewhere without behavioral disturbance: Secondary | ICD-10-CM | POA: Diagnosis not present

## 2016-04-15 DIAGNOSIS — I110 Hypertensive heart disease with heart failure: Secondary | ICD-10-CM | POA: Diagnosis not present

## 2016-04-15 DIAGNOSIS — S72002D Fracture of unspecified part of neck of left femur, subsequent encounter for closed fracture with routine healing: Secondary | ICD-10-CM | POA: Diagnosis not present

## 2016-04-15 DIAGNOSIS — G309 Alzheimer's disease, unspecified: Secondary | ICD-10-CM | POA: Diagnosis not present

## 2016-04-15 DIAGNOSIS — W19XXXD Unspecified fall, subsequent encounter: Secondary | ICD-10-CM | POA: Diagnosis not present

## 2016-04-17 DIAGNOSIS — G309 Alzheimer's disease, unspecified: Secondary | ICD-10-CM | POA: Diagnosis not present

## 2016-04-17 DIAGNOSIS — W19XXXD Unspecified fall, subsequent encounter: Secondary | ICD-10-CM | POA: Diagnosis not present

## 2016-04-17 DIAGNOSIS — F0280 Dementia in other diseases classified elsewhere without behavioral disturbance: Secondary | ICD-10-CM | POA: Diagnosis not present

## 2016-04-17 DIAGNOSIS — I5022 Chronic systolic (congestive) heart failure: Secondary | ICD-10-CM | POA: Diagnosis not present

## 2016-04-17 DIAGNOSIS — S72002D Fracture of unspecified part of neck of left femur, subsequent encounter for closed fracture with routine healing: Secondary | ICD-10-CM | POA: Diagnosis not present

## 2016-04-17 DIAGNOSIS — I110 Hypertensive heart disease with heart failure: Secondary | ICD-10-CM | POA: Diagnosis not present

## 2016-04-21 DIAGNOSIS — S72002S Fracture of unspecified part of neck of left femur, sequela: Secondary | ICD-10-CM | POA: Diagnosis not present

## 2016-04-21 DIAGNOSIS — F329 Major depressive disorder, single episode, unspecified: Secondary | ICD-10-CM | POA: Diagnosis not present

## 2016-04-21 DIAGNOSIS — D649 Anemia, unspecified: Secondary | ICD-10-CM | POA: Diagnosis not present

## 2016-04-21 DIAGNOSIS — S72012D Unspecified intracapsular fracture of left femur, subsequent encounter for closed fracture with routine healing: Secondary | ICD-10-CM | POA: Diagnosis not present

## 2016-04-21 DIAGNOSIS — Z23 Encounter for immunization: Secondary | ICD-10-CM | POA: Diagnosis not present

## 2016-04-22 DIAGNOSIS — G309 Alzheimer's disease, unspecified: Secondary | ICD-10-CM | POA: Diagnosis not present

## 2016-04-22 DIAGNOSIS — I5022 Chronic systolic (congestive) heart failure: Secondary | ICD-10-CM | POA: Diagnosis not present

## 2016-04-22 DIAGNOSIS — I110 Hypertensive heart disease with heart failure: Secondary | ICD-10-CM | POA: Diagnosis not present

## 2016-04-22 DIAGNOSIS — W19XXXD Unspecified fall, subsequent encounter: Secondary | ICD-10-CM | POA: Diagnosis not present

## 2016-04-22 DIAGNOSIS — F0280 Dementia in other diseases classified elsewhere without behavioral disturbance: Secondary | ICD-10-CM | POA: Diagnosis not present

## 2016-04-22 DIAGNOSIS — S72002D Fracture of unspecified part of neck of left femur, subsequent encounter for closed fracture with routine healing: Secondary | ICD-10-CM | POA: Diagnosis not present

## 2016-04-24 DIAGNOSIS — G309 Alzheimer's disease, unspecified: Secondary | ICD-10-CM | POA: Diagnosis not present

## 2016-04-24 DIAGNOSIS — F0280 Dementia in other diseases classified elsewhere without behavioral disturbance: Secondary | ICD-10-CM | POA: Diagnosis not present

## 2016-04-24 DIAGNOSIS — W19XXXD Unspecified fall, subsequent encounter: Secondary | ICD-10-CM | POA: Diagnosis not present

## 2016-04-24 DIAGNOSIS — S72002D Fracture of unspecified part of neck of left femur, subsequent encounter for closed fracture with routine healing: Secondary | ICD-10-CM | POA: Diagnosis not present

## 2016-04-24 DIAGNOSIS — I5022 Chronic systolic (congestive) heart failure: Secondary | ICD-10-CM | POA: Diagnosis not present

## 2016-04-24 DIAGNOSIS — I110 Hypertensive heart disease with heart failure: Secondary | ICD-10-CM | POA: Diagnosis not present

## 2016-04-27 DIAGNOSIS — I5022 Chronic systolic (congestive) heart failure: Secondary | ICD-10-CM | POA: Diagnosis not present

## 2016-04-27 DIAGNOSIS — I110 Hypertensive heart disease with heart failure: Secondary | ICD-10-CM | POA: Diagnosis not present

## 2016-04-27 DIAGNOSIS — G309 Alzheimer's disease, unspecified: Secondary | ICD-10-CM | POA: Diagnosis not present

## 2016-04-27 DIAGNOSIS — W19XXXD Unspecified fall, subsequent encounter: Secondary | ICD-10-CM | POA: Diagnosis not present

## 2016-04-27 DIAGNOSIS — F0280 Dementia in other diseases classified elsewhere without behavioral disturbance: Secondary | ICD-10-CM | POA: Diagnosis not present

## 2016-04-27 DIAGNOSIS — S72002D Fracture of unspecified part of neck of left femur, subsequent encounter for closed fracture with routine healing: Secondary | ICD-10-CM | POA: Diagnosis not present

## 2016-04-28 DIAGNOSIS — I110 Hypertensive heart disease with heart failure: Secondary | ICD-10-CM | POA: Diagnosis not present

## 2016-04-28 DIAGNOSIS — W19XXXD Unspecified fall, subsequent encounter: Secondary | ICD-10-CM | POA: Diagnosis not present

## 2016-04-28 DIAGNOSIS — S72002D Fracture of unspecified part of neck of left femur, subsequent encounter for closed fracture with routine healing: Secondary | ICD-10-CM | POA: Diagnosis not present

## 2016-04-28 DIAGNOSIS — I5022 Chronic systolic (congestive) heart failure: Secondary | ICD-10-CM | POA: Diagnosis not present

## 2016-04-28 DIAGNOSIS — F0280 Dementia in other diseases classified elsewhere without behavioral disturbance: Secondary | ICD-10-CM | POA: Diagnosis not present

## 2016-04-28 DIAGNOSIS — G309 Alzheimer's disease, unspecified: Secondary | ICD-10-CM | POA: Diagnosis not present

## 2016-04-30 DIAGNOSIS — I110 Hypertensive heart disease with heart failure: Secondary | ICD-10-CM | POA: Diagnosis not present

## 2016-04-30 DIAGNOSIS — W19XXXD Unspecified fall, subsequent encounter: Secondary | ICD-10-CM | POA: Diagnosis not present

## 2016-04-30 DIAGNOSIS — S72002D Fracture of unspecified part of neck of left femur, subsequent encounter for closed fracture with routine healing: Secondary | ICD-10-CM | POA: Diagnosis not present

## 2016-04-30 DIAGNOSIS — F0280 Dementia in other diseases classified elsewhere without behavioral disturbance: Secondary | ICD-10-CM | POA: Diagnosis not present

## 2016-04-30 DIAGNOSIS — G309 Alzheimer's disease, unspecified: Secondary | ICD-10-CM | POA: Diagnosis not present

## 2016-04-30 DIAGNOSIS — I5022 Chronic systolic (congestive) heart failure: Secondary | ICD-10-CM | POA: Diagnosis not present

## 2016-05-05 DIAGNOSIS — S72002D Fracture of unspecified part of neck of left femur, subsequent encounter for closed fracture with routine healing: Secondary | ICD-10-CM | POA: Diagnosis not present

## 2016-05-05 DIAGNOSIS — F0280 Dementia in other diseases classified elsewhere without behavioral disturbance: Secondary | ICD-10-CM | POA: Diagnosis not present

## 2016-05-05 DIAGNOSIS — I110 Hypertensive heart disease with heart failure: Secondary | ICD-10-CM | POA: Diagnosis not present

## 2016-05-05 DIAGNOSIS — G309 Alzheimer's disease, unspecified: Secondary | ICD-10-CM | POA: Diagnosis not present

## 2016-05-05 DIAGNOSIS — I5022 Chronic systolic (congestive) heart failure: Secondary | ICD-10-CM | POA: Diagnosis not present

## 2016-05-05 DIAGNOSIS — W19XXXD Unspecified fall, subsequent encounter: Secondary | ICD-10-CM | POA: Diagnosis not present

## 2016-05-07 DIAGNOSIS — S72002D Fracture of unspecified part of neck of left femur, subsequent encounter for closed fracture with routine healing: Secondary | ICD-10-CM | POA: Diagnosis not present

## 2016-05-07 DIAGNOSIS — G309 Alzheimer's disease, unspecified: Secondary | ICD-10-CM | POA: Diagnosis not present

## 2016-05-07 DIAGNOSIS — I110 Hypertensive heart disease with heart failure: Secondary | ICD-10-CM | POA: Diagnosis not present

## 2016-05-07 DIAGNOSIS — I5022 Chronic systolic (congestive) heart failure: Secondary | ICD-10-CM | POA: Diagnosis not present

## 2016-05-07 DIAGNOSIS — W19XXXD Unspecified fall, subsequent encounter: Secondary | ICD-10-CM | POA: Diagnosis not present

## 2016-05-07 DIAGNOSIS — F0280 Dementia in other diseases classified elsewhere without behavioral disturbance: Secondary | ICD-10-CM | POA: Diagnosis not present

## 2016-05-19 ENCOUNTER — Ambulatory Visit (INDEPENDENT_AMBULATORY_CARE_PROVIDER_SITE_OTHER): Payer: Medicare Other | Admitting: *Deleted

## 2016-05-19 ENCOUNTER — Telehealth: Payer: Self-pay | Admitting: Cardiology

## 2016-05-19 DIAGNOSIS — I495 Sick sinus syndrome: Secondary | ICD-10-CM

## 2016-05-19 NOTE — Telephone Encounter (Signed)
Confirmed remote transmission w/ pt caregiver.   

## 2016-05-20 NOTE — Progress Notes (Signed)
Remote pacemaker transmission.   

## 2016-05-21 ENCOUNTER — Encounter: Payer: Self-pay | Admitting: Cardiology

## 2016-05-28 DIAGNOSIS — R441 Visual hallucinations: Secondary | ICD-10-CM | POA: Diagnosis not present

## 2016-05-28 DIAGNOSIS — F411 Generalized anxiety disorder: Secondary | ICD-10-CM | POA: Diagnosis not present

## 2016-06-24 ENCOUNTER — Other Ambulatory Visit: Payer: Self-pay | Admitting: Cardiovascular Disease

## 2016-06-24 NOTE — Telephone Encounter (Signed)
REFILL 

## 2016-07-02 LAB — CUP PACEART REMOTE DEVICE CHECK
Battery Remaining Longevity: 64 mo
Battery Remaining Percentage: 95.5 %
Brady Statistic AP VS Percent: 1 %
Brady Statistic AS VS Percent: 1 %
Brady Statistic RA Percent Paced: 23 %
Date Time Interrogation Session: 20171129033607
Implantable Lead Implant Date: 20070720
Implantable Lead Location: 753860
Implantable Lead Model: 5076
Implantable Pulse Generator Implant Date: 20140602
Lead Channel Impedance Value: 430 Ohm
Lead Channel Impedance Value: 790 Ohm
Lead Channel Pacing Threshold Amplitude: 0.5 V
Lead Channel Pacing Threshold Amplitude: 1.25 V
Lead Channel Pacing Threshold Pulse Width: 0.4 ms
Lead Channel Sensing Intrinsic Amplitude: 1.6 mV
Lead Channel Sensing Intrinsic Amplitude: 12 mV
Lead Channel Setting Pacing Amplitude: 2 V
Lead Channel Setting Pacing Amplitude: 2.25 V
Lead Channel Setting Pacing Amplitude: 2.5 V
Lead Channel Setting Pacing Pulse Width: 0.5 ms
Lead Channel Setting Sensing Sensitivity: 5 mV
MDC IDC LEAD IMPLANT DT: 20070720
MDC IDC LEAD IMPLANT DT: 20140602
MDC IDC LEAD LOCATION: 753858
MDC IDC LEAD LOCATION: 753859
MDC IDC MSMT BATTERY VOLTAGE: 2.96 V
MDC IDC MSMT LEADCHNL LV PACING THRESHOLD PULSEWIDTH: 1 ms
MDC IDC MSMT LEADCHNL RA PACING THRESHOLD AMPLITUDE: 0.5 V
MDC IDC MSMT LEADCHNL RV IMPEDANCE VALUE: 480 Ohm
MDC IDC MSMT LEADCHNL RV PACING THRESHOLD PULSEWIDTH: 0.5 ms
MDC IDC PG SERIAL: 2960456
MDC IDC SET LEADCHNL LV PACING PULSEWIDTH: 1 ms
MDC IDC STAT BRADY AP VP PERCENT: 23 %
MDC IDC STAT BRADY AS VP PERCENT: 77 %
Pulse Gen Model: 3242

## 2016-07-13 ENCOUNTER — Other Ambulatory Visit: Payer: Self-pay | Admitting: Cardiovascular Disease

## 2016-07-22 DIAGNOSIS — A419 Sepsis, unspecified organism: Secondary | ICD-10-CM | POA: Diagnosis not present

## 2016-07-22 DIAGNOSIS — R2689 Other abnormalities of gait and mobility: Secondary | ICD-10-CM | POA: Diagnosis not present

## 2016-07-22 DIAGNOSIS — F0390 Unspecified dementia without behavioral disturbance: Secondary | ICD-10-CM | POA: Diagnosis not present

## 2016-07-22 DIAGNOSIS — R410 Disorientation, unspecified: Secondary | ICD-10-CM | POA: Diagnosis not present

## 2016-07-22 DIAGNOSIS — J111 Influenza due to unidentified influenza virus with other respiratory manifestations: Secondary | ICD-10-CM | POA: Diagnosis not present

## 2016-07-22 DIAGNOSIS — R262 Difficulty in walking, not elsewhere classified: Secondary | ICD-10-CM | POA: Diagnosis not present

## 2016-07-22 DIAGNOSIS — R531 Weakness: Secondary | ICD-10-CM | POA: Diagnosis not present

## 2016-07-22 DIAGNOSIS — R404 Transient alteration of awareness: Secondary | ICD-10-CM | POA: Diagnosis not present

## 2016-07-24 DIAGNOSIS — F419 Anxiety disorder, unspecified: Secondary | ICD-10-CM | POA: Diagnosis not present

## 2016-07-24 DIAGNOSIS — J1189 Influenza due to unidentified influenza virus with other manifestations: Secondary | ICD-10-CM | POA: Diagnosis not present

## 2016-07-24 DIAGNOSIS — I255 Ischemic cardiomyopathy: Secondary | ICD-10-CM | POA: Diagnosis not present

## 2016-07-24 DIAGNOSIS — M6281 Muscle weakness (generalized): Secondary | ICD-10-CM | POA: Diagnosis not present

## 2016-07-24 DIAGNOSIS — Z951 Presence of aortocoronary bypass graft: Secondary | ICD-10-CM | POA: Diagnosis not present

## 2016-07-24 DIAGNOSIS — I2510 Atherosclerotic heart disease of native coronary artery without angina pectoris: Secondary | ICD-10-CM | POA: Diagnosis not present

## 2016-07-24 DIAGNOSIS — F0280 Dementia in other diseases classified elsewhere without behavioral disturbance: Secondary | ICD-10-CM | POA: Diagnosis not present

## 2016-07-24 DIAGNOSIS — I951 Orthostatic hypotension: Secondary | ICD-10-CM | POA: Diagnosis not present

## 2016-07-24 DIAGNOSIS — I5022 Chronic systolic (congestive) heart failure: Secondary | ICD-10-CM | POA: Diagnosis not present

## 2016-07-24 DIAGNOSIS — I480 Paroxysmal atrial fibrillation: Secondary | ICD-10-CM | POA: Diagnosis not present

## 2016-07-24 DIAGNOSIS — G309 Alzheimer's disease, unspecified: Secondary | ICD-10-CM | POA: Diagnosis not present

## 2016-07-24 DIAGNOSIS — F329 Major depressive disorder, single episode, unspecified: Secondary | ICD-10-CM | POA: Diagnosis not present

## 2016-07-24 DIAGNOSIS — I110 Hypertensive heart disease with heart failure: Secondary | ICD-10-CM | POA: Diagnosis not present

## 2016-07-24 DIAGNOSIS — Z950 Presence of cardiac pacemaker: Secondary | ICD-10-CM | POA: Diagnosis not present

## 2016-07-24 DIAGNOSIS — Z7901 Long term (current) use of anticoagulants: Secondary | ICD-10-CM | POA: Diagnosis not present

## 2016-07-27 DIAGNOSIS — I110 Hypertensive heart disease with heart failure: Secondary | ICD-10-CM | POA: Diagnosis not present

## 2016-07-27 DIAGNOSIS — I951 Orthostatic hypotension: Secondary | ICD-10-CM | POA: Diagnosis not present

## 2016-07-27 DIAGNOSIS — M6281 Muscle weakness (generalized): Secondary | ICD-10-CM | POA: Diagnosis not present

## 2016-07-27 DIAGNOSIS — I255 Ischemic cardiomyopathy: Secondary | ICD-10-CM | POA: Diagnosis not present

## 2016-07-27 DIAGNOSIS — G309 Alzheimer's disease, unspecified: Secondary | ICD-10-CM | POA: Diagnosis not present

## 2016-07-27 DIAGNOSIS — I5022 Chronic systolic (congestive) heart failure: Secondary | ICD-10-CM | POA: Diagnosis not present

## 2016-07-30 DIAGNOSIS — M6281 Muscle weakness (generalized): Secondary | ICD-10-CM | POA: Diagnosis not present

## 2016-07-30 DIAGNOSIS — I110 Hypertensive heart disease with heart failure: Secondary | ICD-10-CM | POA: Diagnosis not present

## 2016-07-30 DIAGNOSIS — I951 Orthostatic hypotension: Secondary | ICD-10-CM | POA: Diagnosis not present

## 2016-07-30 DIAGNOSIS — I5022 Chronic systolic (congestive) heart failure: Secondary | ICD-10-CM | POA: Diagnosis not present

## 2016-07-30 DIAGNOSIS — G309 Alzheimer's disease, unspecified: Secondary | ICD-10-CM | POA: Diagnosis not present

## 2016-07-30 DIAGNOSIS — I255 Ischemic cardiomyopathy: Secondary | ICD-10-CM | POA: Diagnosis not present

## 2016-08-03 DIAGNOSIS — R55 Syncope and collapse: Secondary | ICD-10-CM | POA: Diagnosis not present

## 2016-08-03 DIAGNOSIS — R404 Transient alteration of awareness: Secondary | ICD-10-CM | POA: Diagnosis not present

## 2016-08-03 DIAGNOSIS — R079 Chest pain, unspecified: Secondary | ICD-10-CM | POA: Diagnosis not present

## 2016-08-03 DIAGNOSIS — R569 Unspecified convulsions: Secondary | ICD-10-CM | POA: Diagnosis not present

## 2016-08-04 DIAGNOSIS — R55 Syncope and collapse: Secondary | ICD-10-CM | POA: Diagnosis not present

## 2016-08-04 DIAGNOSIS — Z8673 Personal history of transient ischemic attack (TIA), and cerebral infarction without residual deficits: Secondary | ICD-10-CM | POA: Diagnosis not present

## 2016-08-04 DIAGNOSIS — R5381 Other malaise: Secondary | ICD-10-CM | POA: Diagnosis not present

## 2016-08-04 DIAGNOSIS — Z833 Family history of diabetes mellitus: Secondary | ICD-10-CM | POA: Diagnosis not present

## 2016-08-04 DIAGNOSIS — Z951 Presence of aortocoronary bypass graft: Secondary | ICD-10-CM | POA: Diagnosis not present

## 2016-08-04 DIAGNOSIS — I482 Chronic atrial fibrillation: Secondary | ICD-10-CM | POA: Diagnosis not present

## 2016-08-04 DIAGNOSIS — I10 Essential (primary) hypertension: Secondary | ICD-10-CM | POA: Diagnosis not present

## 2016-08-04 DIAGNOSIS — R569 Unspecified convulsions: Secondary | ICD-10-CM | POA: Diagnosis not present

## 2016-08-04 DIAGNOSIS — R079 Chest pain, unspecified: Secondary | ICD-10-CM | POA: Diagnosis not present

## 2016-08-04 DIAGNOSIS — I2510 Atherosclerotic heart disease of native coronary artery without angina pectoris: Secondary | ICD-10-CM | POA: Diagnosis present

## 2016-08-04 DIAGNOSIS — E039 Hypothyroidism, unspecified: Secondary | ICD-10-CM | POA: Diagnosis present

## 2016-08-04 DIAGNOSIS — Z950 Presence of cardiac pacemaker: Secondary | ICD-10-CM | POA: Diagnosis not present

## 2016-08-04 DIAGNOSIS — I4891 Unspecified atrial fibrillation: Secondary | ICD-10-CM | POA: Diagnosis not present

## 2016-08-04 DIAGNOSIS — F0390 Unspecified dementia without behavioral disturbance: Secondary | ICD-10-CM | POA: Diagnosis not present

## 2016-08-04 DIAGNOSIS — Z794 Long term (current) use of insulin: Secondary | ICD-10-CM | POA: Diagnosis not present

## 2016-08-04 DIAGNOSIS — F410 Panic disorder [episodic paroxysmal anxiety] without agoraphobia: Secondary | ICD-10-CM | POA: Diagnosis present

## 2016-08-04 DIAGNOSIS — R4182 Altered mental status, unspecified: Secondary | ICD-10-CM | POA: Diagnosis not present

## 2016-08-04 DIAGNOSIS — N289 Disorder of kidney and ureter, unspecified: Secondary | ICD-10-CM | POA: Diagnosis not present

## 2016-08-04 DIAGNOSIS — R269 Unspecified abnormalities of gait and mobility: Secondary | ICD-10-CM | POA: Diagnosis present

## 2016-08-07 DIAGNOSIS — I951 Orthostatic hypotension: Secondary | ICD-10-CM | POA: Diagnosis not present

## 2016-08-07 DIAGNOSIS — G309 Alzheimer's disease, unspecified: Secondary | ICD-10-CM | POA: Diagnosis not present

## 2016-08-07 DIAGNOSIS — I110 Hypertensive heart disease with heart failure: Secondary | ICD-10-CM | POA: Diagnosis not present

## 2016-08-07 DIAGNOSIS — M6281 Muscle weakness (generalized): Secondary | ICD-10-CM | POA: Diagnosis not present

## 2016-08-07 DIAGNOSIS — I5022 Chronic systolic (congestive) heart failure: Secondary | ICD-10-CM | POA: Diagnosis not present

## 2016-08-07 DIAGNOSIS — I255 Ischemic cardiomyopathy: Secondary | ICD-10-CM | POA: Diagnosis not present

## 2016-08-10 DIAGNOSIS — I5022 Chronic systolic (congestive) heart failure: Secondary | ICD-10-CM | POA: Diagnosis not present

## 2016-08-10 DIAGNOSIS — M6281 Muscle weakness (generalized): Secondary | ICD-10-CM | POA: Diagnosis not present

## 2016-08-10 DIAGNOSIS — I255 Ischemic cardiomyopathy: Secondary | ICD-10-CM | POA: Diagnosis not present

## 2016-08-10 DIAGNOSIS — G309 Alzheimer's disease, unspecified: Secondary | ICD-10-CM | POA: Diagnosis not present

## 2016-08-10 DIAGNOSIS — I110 Hypertensive heart disease with heart failure: Secondary | ICD-10-CM | POA: Diagnosis not present

## 2016-08-10 DIAGNOSIS — I951 Orthostatic hypotension: Secondary | ICD-10-CM | POA: Diagnosis not present

## 2016-08-12 DIAGNOSIS — M6281 Muscle weakness (generalized): Secondary | ICD-10-CM | POA: Diagnosis not present

## 2016-08-12 DIAGNOSIS — I110 Hypertensive heart disease with heart failure: Secondary | ICD-10-CM | POA: Diagnosis not present

## 2016-08-12 DIAGNOSIS — G309 Alzheimer's disease, unspecified: Secondary | ICD-10-CM | POA: Diagnosis not present

## 2016-08-12 DIAGNOSIS — I5022 Chronic systolic (congestive) heart failure: Secondary | ICD-10-CM | POA: Diagnosis not present

## 2016-08-12 DIAGNOSIS — I255 Ischemic cardiomyopathy: Secondary | ICD-10-CM | POA: Diagnosis not present

## 2016-08-12 DIAGNOSIS — I951 Orthostatic hypotension: Secondary | ICD-10-CM | POA: Diagnosis not present

## 2016-08-14 DIAGNOSIS — Z0000 Encounter for general adult medical examination without abnormal findings: Secondary | ICD-10-CM | POA: Diagnosis not present

## 2016-08-14 DIAGNOSIS — R569 Unspecified convulsions: Secondary | ICD-10-CM | POA: Diagnosis not present

## 2016-08-18 ENCOUNTER — Telehealth: Payer: Self-pay | Admitting: Cardiology

## 2016-08-18 ENCOUNTER — Ambulatory Visit (INDEPENDENT_AMBULATORY_CARE_PROVIDER_SITE_OTHER): Payer: Medicare Other | Admitting: *Deleted

## 2016-08-18 DIAGNOSIS — G309 Alzheimer's disease, unspecified: Secondary | ICD-10-CM | POA: Diagnosis not present

## 2016-08-18 DIAGNOSIS — M6281 Muscle weakness (generalized): Secondary | ICD-10-CM | POA: Diagnosis not present

## 2016-08-18 DIAGNOSIS — I951 Orthostatic hypotension: Secondary | ICD-10-CM | POA: Diagnosis not present

## 2016-08-18 DIAGNOSIS — I110 Hypertensive heart disease with heart failure: Secondary | ICD-10-CM | POA: Diagnosis not present

## 2016-08-18 DIAGNOSIS — I5022 Chronic systolic (congestive) heart failure: Secondary | ICD-10-CM | POA: Diagnosis not present

## 2016-08-18 DIAGNOSIS — I495 Sick sinus syndrome: Secondary | ICD-10-CM | POA: Diagnosis not present

## 2016-08-18 DIAGNOSIS — I255 Ischemic cardiomyopathy: Secondary | ICD-10-CM | POA: Diagnosis not present

## 2016-08-18 NOTE — Telephone Encounter (Signed)
Confirmed remote transmission w/ pt cargiver.    

## 2016-08-19 ENCOUNTER — Encounter: Payer: Self-pay | Admitting: Cardiology

## 2016-08-19 DIAGNOSIS — K219 Gastro-esophageal reflux disease without esophagitis: Secondary | ICD-10-CM | POA: Diagnosis not present

## 2016-08-19 DIAGNOSIS — R110 Nausea: Secondary | ICD-10-CM | POA: Diagnosis not present

## 2016-08-19 DIAGNOSIS — K222 Esophageal obstruction: Secondary | ICD-10-CM | POA: Diagnosis not present

## 2016-08-19 NOTE — Progress Notes (Signed)
Remote pacemaker transmission.   

## 2016-08-20 DIAGNOSIS — I255 Ischemic cardiomyopathy: Secondary | ICD-10-CM | POA: Diagnosis not present

## 2016-08-20 DIAGNOSIS — G309 Alzheimer's disease, unspecified: Secondary | ICD-10-CM | POA: Diagnosis not present

## 2016-08-20 DIAGNOSIS — I951 Orthostatic hypotension: Secondary | ICD-10-CM | POA: Diagnosis not present

## 2016-08-20 DIAGNOSIS — M6281 Muscle weakness (generalized): Secondary | ICD-10-CM | POA: Diagnosis not present

## 2016-08-20 DIAGNOSIS — I110 Hypertensive heart disease with heart failure: Secondary | ICD-10-CM | POA: Diagnosis not present

## 2016-08-20 DIAGNOSIS — I5022 Chronic systolic (congestive) heart failure: Secondary | ICD-10-CM | POA: Diagnosis not present

## 2016-08-21 LAB — CUP PACEART REMOTE DEVICE CHECK
Implantable Lead Implant Date: 20070720
Implantable Lead Implant Date: 20140602
Implantable Lead Location: 753860
Implantable Lead Model: 4092
Implantable Pulse Generator Implant Date: 20140602
MDC IDC LEAD IMPLANT DT: 20070720
MDC IDC LEAD LOCATION: 753858
MDC IDC LEAD LOCATION: 753859
MDC IDC PG SERIAL: 2960456
MDC IDC SESS DTM: 20180302112748
Pulse Gen Model: 3242

## 2016-08-26 DIAGNOSIS — I110 Hypertensive heart disease with heart failure: Secondary | ICD-10-CM | POA: Diagnosis not present

## 2016-08-26 DIAGNOSIS — I951 Orthostatic hypotension: Secondary | ICD-10-CM | POA: Diagnosis not present

## 2016-08-26 DIAGNOSIS — G309 Alzheimer's disease, unspecified: Secondary | ICD-10-CM | POA: Diagnosis not present

## 2016-08-26 DIAGNOSIS — M6281 Muscle weakness (generalized): Secondary | ICD-10-CM | POA: Diagnosis not present

## 2016-08-26 DIAGNOSIS — I255 Ischemic cardiomyopathy: Secondary | ICD-10-CM | POA: Diagnosis not present

## 2016-08-26 DIAGNOSIS — I5022 Chronic systolic (congestive) heart failure: Secondary | ICD-10-CM | POA: Diagnosis not present

## 2016-08-31 DIAGNOSIS — I951 Orthostatic hypotension: Secondary | ICD-10-CM | POA: Diagnosis not present

## 2016-08-31 DIAGNOSIS — M6281 Muscle weakness (generalized): Secondary | ICD-10-CM | POA: Diagnosis not present

## 2016-08-31 DIAGNOSIS — I255 Ischemic cardiomyopathy: Secondary | ICD-10-CM | POA: Diagnosis not present

## 2016-08-31 DIAGNOSIS — G309 Alzheimer's disease, unspecified: Secondary | ICD-10-CM | POA: Diagnosis not present

## 2016-08-31 DIAGNOSIS — I110 Hypertensive heart disease with heart failure: Secondary | ICD-10-CM | POA: Diagnosis not present

## 2016-08-31 DIAGNOSIS — I5022 Chronic systolic (congestive) heart failure: Secondary | ICD-10-CM | POA: Diagnosis not present

## 2016-09-07 DIAGNOSIS — I951 Orthostatic hypotension: Secondary | ICD-10-CM | POA: Diagnosis not present

## 2016-09-07 DIAGNOSIS — I5022 Chronic systolic (congestive) heart failure: Secondary | ICD-10-CM | POA: Diagnosis not present

## 2016-09-07 DIAGNOSIS — M6281 Muscle weakness (generalized): Secondary | ICD-10-CM | POA: Diagnosis not present

## 2016-09-07 DIAGNOSIS — G309 Alzheimer's disease, unspecified: Secondary | ICD-10-CM | POA: Diagnosis not present

## 2016-09-07 DIAGNOSIS — I255 Ischemic cardiomyopathy: Secondary | ICD-10-CM | POA: Diagnosis not present

## 2016-09-07 DIAGNOSIS — I110 Hypertensive heart disease with heart failure: Secondary | ICD-10-CM | POA: Diagnosis not present

## 2016-09-09 DIAGNOSIS — D225 Melanocytic nevi of trunk: Secondary | ICD-10-CM | POA: Diagnosis not present

## 2016-09-09 DIAGNOSIS — L578 Other skin changes due to chronic exposure to nonionizing radiation: Secondary | ICD-10-CM | POA: Diagnosis not present

## 2016-09-09 DIAGNOSIS — R569 Unspecified convulsions: Secondary | ICD-10-CM | POA: Diagnosis not present

## 2016-09-09 DIAGNOSIS — L821 Other seborrheic keratosis: Secondary | ICD-10-CM | POA: Diagnosis not present

## 2016-09-09 DIAGNOSIS — B079 Viral wart, unspecified: Secondary | ICD-10-CM | POA: Diagnosis not present

## 2016-09-16 DIAGNOSIS — K219 Gastro-esophageal reflux disease without esophagitis: Secondary | ICD-10-CM | POA: Diagnosis not present

## 2016-09-16 DIAGNOSIS — K222 Esophageal obstruction: Secondary | ICD-10-CM | POA: Diagnosis not present

## 2016-09-16 DIAGNOSIS — R110 Nausea: Secondary | ICD-10-CM | POA: Diagnosis not present

## 2016-09-17 ENCOUNTER — Other Ambulatory Visit: Payer: Self-pay | Admitting: Cardiovascular Disease

## 2016-09-18 ENCOUNTER — Other Ambulatory Visit: Payer: Self-pay

## 2016-09-18 ENCOUNTER — Inpatient Hospital Stay (HOSPITAL_COMMUNITY)
Admission: AD | Admit: 2016-09-18 | Discharge: 2016-09-23 | DRG: 394 | Disposition: A | Discharge status: Home Health | Payer: Medicare Other | Source: Other Acute Inpatient Hospital | Attending: Cardiology | Admitting: Cardiology

## 2016-09-18 ENCOUNTER — Inpatient Hospital Stay (HOSPITAL_COMMUNITY): Payer: Medicare Other

## 2016-09-18 DIAGNOSIS — Z955 Presence of coronary angioplasty implant and graft: Secondary | ICD-10-CM

## 2016-09-18 DIAGNOSIS — J9811 Atelectasis: Secondary | ICD-10-CM | POA: Diagnosis not present

## 2016-09-18 DIAGNOSIS — R110 Nausea: Secondary | ICD-10-CM | POA: Diagnosis not present

## 2016-09-18 DIAGNOSIS — I255 Ischemic cardiomyopathy: Secondary | ICD-10-CM | POA: Diagnosis present

## 2016-09-18 DIAGNOSIS — I509 Heart failure, unspecified: Secondary | ICD-10-CM

## 2016-09-18 DIAGNOSIS — Z86010 Personal history of colonic polyps: Secondary | ICD-10-CM | POA: Diagnosis not present

## 2016-09-18 DIAGNOSIS — I5042 Chronic combined systolic (congestive) and diastolic (congestive) heart failure: Secondary | ICD-10-CM | POA: Diagnosis present

## 2016-09-18 DIAGNOSIS — K922 Gastrointestinal hemorrhage, unspecified: Secondary | ICD-10-CM | POA: Diagnosis not present

## 2016-09-18 DIAGNOSIS — I130 Hypertensive heart and chronic kidney disease with heart failure and stage 1 through stage 4 chronic kidney disease, or unspecified chronic kidney disease: Secondary | ICD-10-CM | POA: Diagnosis not present

## 2016-09-18 DIAGNOSIS — I10 Essential (primary) hypertension: Secondary | ICD-10-CM | POA: Diagnosis present

## 2016-09-18 DIAGNOSIS — Z951 Presence of aortocoronary bypass graft: Secondary | ICD-10-CM | POA: Diagnosis not present

## 2016-09-18 DIAGNOSIS — G4733 Obstructive sleep apnea (adult) (pediatric): Secondary | ICD-10-CM | POA: Diagnosis present

## 2016-09-18 DIAGNOSIS — R071 Chest pain on breathing: Secondary | ICD-10-CM

## 2016-09-18 DIAGNOSIS — D500 Iron deficiency anemia secondary to blood loss (chronic): Secondary | ICD-10-CM

## 2016-09-18 DIAGNOSIS — F329 Major depressive disorder, single episode, unspecified: Secondary | ICD-10-CM | POA: Diagnosis present

## 2016-09-18 DIAGNOSIS — D122 Benign neoplasm of ascending colon: Secondary | ICD-10-CM | POA: Diagnosis not present

## 2016-09-18 DIAGNOSIS — K635 Polyp of colon: Secondary | ICD-10-CM | POA: Diagnosis not present

## 2016-09-18 DIAGNOSIS — F0390 Unspecified dementia without behavioral disturbance: Secondary | ICD-10-CM | POA: Diagnosis present

## 2016-09-18 DIAGNOSIS — I2510 Atherosclerotic heart disease of native coronary artery without angina pectoris: Secondary | ICD-10-CM | POA: Diagnosis present

## 2016-09-18 DIAGNOSIS — D696 Thrombocytopenia, unspecified: Secondary | ICD-10-CM | POA: Diagnosis present

## 2016-09-18 DIAGNOSIS — I5020 Unspecified systolic (congestive) heart failure: Secondary | ICD-10-CM | POA: Diagnosis not present

## 2016-09-18 DIAGNOSIS — I480 Paroxysmal atrial fibrillation: Secondary | ICD-10-CM | POA: Diagnosis present

## 2016-09-18 DIAGNOSIS — I200 Unstable angina: Secondary | ICD-10-CM | POA: Diagnosis not present

## 2016-09-18 DIAGNOSIS — Z7901 Long term (current) use of anticoagulants: Secondary | ICD-10-CM | POA: Diagnosis not present

## 2016-09-18 DIAGNOSIS — K648 Other hemorrhoids: Secondary | ICD-10-CM | POA: Diagnosis present

## 2016-09-18 DIAGNOSIS — E039 Hypothyroidism, unspecified: Secondary | ICD-10-CM | POA: Diagnosis not present

## 2016-09-18 DIAGNOSIS — I160 Hypertensive urgency: Secondary | ICD-10-CM | POA: Diagnosis present

## 2016-09-18 DIAGNOSIS — K219 Gastro-esophageal reflux disease without esophagitis: Secondary | ICD-10-CM | POA: Diagnosis present

## 2016-09-18 DIAGNOSIS — D509 Iron deficiency anemia, unspecified: Secondary | ICD-10-CM | POA: Diagnosis not present

## 2016-09-18 DIAGNOSIS — R0789 Other chest pain: Secondary | ICD-10-CM | POA: Diagnosis not present

## 2016-09-18 DIAGNOSIS — D62 Acute posthemorrhagic anemia: Secondary | ICD-10-CM | POA: Diagnosis present

## 2016-09-18 DIAGNOSIS — E785 Hyperlipidemia, unspecified: Secondary | ICD-10-CM | POA: Diagnosis present

## 2016-09-18 DIAGNOSIS — I161 Hypertensive emergency: Secondary | ICD-10-CM | POA: Diagnosis not present

## 2016-09-18 DIAGNOSIS — N183 Chronic kidney disease, stage 3 (moderate): Secondary | ICD-10-CM | POA: Diagnosis present

## 2016-09-18 DIAGNOSIS — R079 Chest pain, unspecified: Secondary | ICD-10-CM | POA: Diagnosis not present

## 2016-09-18 DIAGNOSIS — K559 Vascular disorder of intestine, unspecified: Secondary | ICD-10-CM | POA: Diagnosis not present

## 2016-09-18 DIAGNOSIS — D649 Anemia, unspecified: Secondary | ICD-10-CM

## 2016-09-18 DIAGNOSIS — I5033 Acute on chronic diastolic (congestive) heart failure: Secondary | ICD-10-CM | POA: Diagnosis not present

## 2016-09-18 DIAGNOSIS — K5730 Diverticulosis of large intestine without perforation or abscess without bleeding: Secondary | ICD-10-CM | POA: Diagnosis present

## 2016-09-18 DIAGNOSIS — Z8673 Personal history of transient ischemic attack (TIA), and cerebral infarction without residual deficits: Secondary | ICD-10-CM

## 2016-09-18 DIAGNOSIS — Z95810 Presence of automatic (implantable) cardiac defibrillator: Secondary | ICD-10-CM

## 2016-09-18 DIAGNOSIS — Z950 Presence of cardiac pacemaker: Secondary | ICD-10-CM | POA: Diagnosis present

## 2016-09-18 DIAGNOSIS — R195 Other fecal abnormalities: Secondary | ICD-10-CM | POA: Diagnosis not present

## 2016-09-18 DIAGNOSIS — I4891 Unspecified atrial fibrillation: Secondary | ICD-10-CM | POA: Diagnosis present

## 2016-09-18 DIAGNOSIS — I25700 Atherosclerosis of coronary artery bypass graft(s), unspecified, with unstable angina pectoris: Secondary | ICD-10-CM | POA: Diagnosis not present

## 2016-09-18 DIAGNOSIS — Z9889 Other specified postprocedural states: Secondary | ICD-10-CM

## 2016-09-18 DIAGNOSIS — R072 Precordial pain: Secondary | ICD-10-CM | POA: Diagnosis not present

## 2016-09-18 NOTE — Telephone Encounter (Signed)
Rx(s) sent to pharmacy electronically.  

## 2016-09-19 ENCOUNTER — Encounter (HOSPITAL_COMMUNITY): Payer: Self-pay | Admitting: *Deleted

## 2016-09-19 DIAGNOSIS — Z950 Presence of cardiac pacemaker: Secondary | ICD-10-CM

## 2016-09-19 DIAGNOSIS — R072 Precordial pain: Secondary | ICD-10-CM

## 2016-09-19 DIAGNOSIS — I160 Hypertensive urgency: Secondary | ICD-10-CM

## 2016-09-19 DIAGNOSIS — D649 Anemia, unspecified: Secondary | ICD-10-CM

## 2016-09-19 DIAGNOSIS — R079 Chest pain, unspecified: Secondary | ICD-10-CM

## 2016-09-19 DIAGNOSIS — R142 Eructation: Secondary | ICD-10-CM

## 2016-09-19 DIAGNOSIS — D696 Thrombocytopenia, unspecified: Secondary | ICD-10-CM

## 2016-09-19 DIAGNOSIS — I480 Paroxysmal atrial fibrillation: Secondary | ICD-10-CM

## 2016-09-19 LAB — ABO/RH: ABO/RH(D): O POS

## 2016-09-19 LAB — CBC WITH DIFFERENTIAL/PLATELET
BASOS PCT: 0 %
Basophils Absolute: 0 10*3/uL (ref 0.0–0.1)
EOS PCT: 3 %
Eosinophils Absolute: 0.1 10*3/uL (ref 0.0–0.7)
HEMATOCRIT: 24.8 % — AB (ref 36.0–46.0)
Hemoglobin: 7.6 g/dL — ABNORMAL LOW (ref 12.0–15.0)
LYMPHS PCT: 19 %
Lymphs Abs: 0.9 10*3/uL (ref 0.7–4.0)
MCH: 24.8 pg — ABNORMAL LOW (ref 26.0–34.0)
MCHC: 30.6 g/dL (ref 30.0–36.0)
MCV: 80.8 fL (ref 78.0–100.0)
MONO ABS: 0.3 10*3/uL (ref 0.1–1.0)
MONOS PCT: 6 %
NEUTROS ABS: 3.6 10*3/uL (ref 1.7–7.7)
Neutrophils Relative %: 72 %
PLATELETS: 138 10*3/uL — AB (ref 150–400)
RBC: 3.07 MIL/uL — ABNORMAL LOW (ref 3.87–5.11)
RDW: 16.8 % — AB (ref 11.5–15.5)
WBC: 5 10*3/uL (ref 4.0–10.5)

## 2016-09-19 LAB — COMPREHENSIVE METABOLIC PANEL
ALBUMIN: 3.4 g/dL — AB (ref 3.5–5.0)
ALT: 11 U/L — ABNORMAL LOW (ref 14–54)
ANION GAP: 8 (ref 5–15)
AST: 17 U/L (ref 15–41)
Alkaline Phosphatase: 66 U/L (ref 38–126)
BUN: 13 mg/dL (ref 6–20)
CO2: 28 mmol/L (ref 22–32)
Calcium: 8.6 mg/dL — ABNORMAL LOW (ref 8.9–10.3)
Chloride: 102 mmol/L (ref 101–111)
Creatinine, Ser: 1.32 mg/dL — ABNORMAL HIGH (ref 0.44–1.00)
GFR calc Af Amer: 42 mL/min — ABNORMAL LOW (ref >60)
GFR calc non Af Amer: 36 mL/min — ABNORMAL LOW (ref >60)
GLUCOSE: 105 mg/dL — AB (ref 65–99)
Potassium: 3.6 mmol/L (ref 3.5–5.1)
SODIUM: 138 mmol/L (ref 135–145)
Total Bilirubin: 0.9 mg/dL (ref 0.3–1.2)
Total Protein: 5.5 g/dL — ABNORMAL LOW (ref 6.5–8.1)

## 2016-09-19 LAB — CBC
HCT: 24.9 % — ABNORMAL LOW (ref 36.0–46.0)
Hemoglobin: 7.5 g/dL — ABNORMAL LOW (ref 12.0–15.0)
MCH: 24.2 pg — ABNORMAL LOW (ref 26.0–34.0)
MCHC: 30.1 g/dL (ref 30.0–36.0)
MCV: 80.3 fL (ref 78.0–100.0)
PLATELETS: 137 10*3/uL — AB (ref 150–400)
RBC: 3.1 MIL/uL — ABNORMAL LOW (ref 3.87–5.11)
RDW: 16.9 % — AB (ref 11.5–15.5)
WBC: 4.7 10*3/uL (ref 4.0–10.5)

## 2016-09-19 LAB — FERRITIN: FERRITIN: 21 ng/mL (ref 11–307)

## 2016-09-19 LAB — MRSA PCR SCREENING: MRSA BY PCR: NEGATIVE

## 2016-09-19 LAB — IRON AND TIBC
IRON: 27 ug/dL — AB (ref 28–170)
Saturation Ratios: 7 % — ABNORMAL LOW (ref 10.4–31.8)
TIBC: 368 ug/dL (ref 250–450)
UIBC: 341 ug/dL

## 2016-09-19 LAB — TYPE AND SCREEN
ABO/RH(D): O POS
Antibody Screen: NEGATIVE

## 2016-09-19 LAB — BRAIN NATRIURETIC PEPTIDE: B Natriuretic Peptide: 324.2 pg/mL — ABNORMAL HIGH (ref 0.0–100.0)

## 2016-09-19 LAB — VITAMIN B12: Vitamin B-12: 215 pg/mL (ref 180–914)

## 2016-09-19 LAB — HEMOGLOBIN AND HEMATOCRIT, BLOOD
HEMATOCRIT: 25.3 % — AB (ref 36.0–46.0)
Hemoglobin: 7.5 g/dL — ABNORMAL LOW (ref 12.0–15.0)

## 2016-09-19 LAB — TROPONIN I: Troponin I: 0.03 ng/mL (ref <0.03)

## 2016-09-19 MED ORDER — ONDANSETRON HCL 4 MG/2ML IJ SOLN
4.0000 mg | Freq: Four times a day (QID) | INTRAMUSCULAR | Status: DC | PRN
  Administered 2016-09-19 – 2016-09-22 (×4): 4 mg via INTRAVENOUS
  Filled 2016-09-19 (×5): qty 2

## 2016-09-19 MED ORDER — POLYSACCHARIDE IRON COMPLEX 150 MG PO CAPS
150.0000 mg | ORAL_CAPSULE | Freq: Every day | ORAL | Status: DC
  Administered 2016-09-19 – 2016-09-20 (×2): 150 mg via ORAL
  Filled 2016-09-19 (×3): qty 1

## 2016-09-19 MED ORDER — ASPIRIN EC 81 MG PO TBEC: 81.0000 mg | DELAYED_RELEASE_TABLET | Freq: Every day | ORAL | Status: DC

## 2016-09-19 MED ORDER — APIXABAN 2.5 MG PO TABS: 2.5000 mg | ORAL_TABLET | Freq: Two times a day (BID) | ORAL | Status: DC

## 2016-09-19 MED ORDER — ACETAMINOPHEN 325 MG PO TABS
650.0000 mg | ORAL_TABLET | ORAL | Status: DC | PRN
  Administered 2016-09-19 – 2016-09-21 (×2): 650 mg via ORAL
  Filled 2016-09-19 (×2): qty 2

## 2016-09-19 MED ORDER — ALUM & MAG HYDROXIDE-SIMETH 200-200-20 MG/5ML PO SUSP
15.0000 mL | Freq: Every day | ORAL | Status: DC | PRN
  Administered 2016-09-19: 15 mL via ORAL
  Filled 2016-09-19: qty 30

## 2016-09-19 MED ORDER — PANTOPRAZOLE SODIUM 40 MG PO TBEC
40.0000 mg | DELAYED_RELEASE_TABLET | Freq: Every day | ORAL | Status: DC
  Administered 2016-09-19 – 2016-09-20 (×2): 40 mg via ORAL
  Filled 2016-09-19 (×2): qty 1

## 2016-09-19 MED ORDER — DONEPEZIL HCL 10 MG PO TABS
10.0000 mg | ORAL_TABLET | Freq: Every day | ORAL | Status: DC
  Administered 2016-09-19 – 2016-09-22 (×5): 10 mg via ORAL
  Filled 2016-09-19 (×6): qty 1

## 2016-09-19 MED ORDER — NEBIVOLOL HCL 5 MG PO TABS
5.0000 mg | ORAL_TABLET | Freq: Every day | ORAL | Status: DC
  Administered 2016-09-19 – 2016-09-23 (×5): 5 mg via ORAL
  Filled 2016-09-19 (×6): qty 1

## 2016-09-19 MED ORDER — AMLODIPINE BESYLATE 5 MG PO TABS
5.0000 mg | ORAL_TABLET | Freq: Every day | ORAL | Status: DC
  Administered 2016-09-19 – 2016-09-22 (×4): 5 mg via ORAL
  Filled 2016-09-19 (×5): qty 1

## 2016-09-19 MED ORDER — FUROSEMIDE 20 MG PO TABS
20.0000 mg | ORAL_TABLET | Freq: Every day | ORAL | Status: DC
  Administered 2016-09-19 – 2016-09-23 (×4): 20 mg via ORAL
  Filled 2016-09-19 (×5): qty 1

## 2016-09-19 MED ORDER — NITROGLYCERIN IN D5W 200-5 MCG/ML-% IV SOLN: 2.0000 ug/min | INTRAVENOUS | Status: DC

## 2016-09-19 MED ORDER — BUSPIRONE HCL 10 MG PO TABS
10.0000 mg | ORAL_TABLET | Freq: Two times a day (BID) | ORAL | Status: DC
  Administered 2016-09-19 – 2016-09-23 (×10): 10 mg via ORAL
  Filled 2016-09-19 (×11): qty 1

## 2016-09-19 MED ORDER — PAROXETINE HCL 30 MG PO TABS
30.0000 mg | ORAL_TABLET | Freq: Every evening | ORAL | Status: DC
  Administered 2016-09-19 – 2016-09-22 (×4): 30 mg via ORAL
  Filled 2016-09-19 (×5): qty 1

## 2016-09-19 NOTE — H&P (Signed)
History and Physical  Primary Cardiologist: Claiborne Billings PCP: Myrlene Broker, MD  Chief Complaint: Chest Pain - Transferred from [facility] ED   HPI:  [name] is a 83 year old female with history of CAD s/p CABG 2003 (LIMA to LAD, SVG to ramus and mitral valve repair with ring), chronic systolic and diastolic heart failure, ischemic EF 25-35% s/p Bi-V ICD 2014 with EF recovery, atrial fibrillation on apixaban.  Her Bi-V was an upgrade for a previous dual chamber device placed for heart block.  Other history includes OSA, HTN, HLD, CKD, TIA, anemia, and previous PCI of her native vessels. She also suffered a seizure last month and is following with [facility] neurology in [place] for this with outpatient EEG ordered for Tuesday.   She has previously had admissions for acute CHF (april 2016) at [facility].  Her most recent TTE in 2015 showed improved EF to 55%.    History slightly limited by her dementia, aided by her husband.  She had noted chest pressure for 2-3 days which escalated to include diaphoresis and nausea earlier today prompting presentation to [facility] ED.  No change in mild SOB.  No edema, orthopnea.    At OSH, Blood pressure was significantly elevated (220s) at OSH ED which improved with nitro paste, then drip. ECG at OSH showed sinus rhythm with BiV pacing at 63 with stable TWI V2 and laterally when compared to previous.  Troponin negative, WBC 4.2, HGB 8.8, HCT 26.6, PLT 153, INR 1, CRT 1.3, pro BNP 4590 (remainder of CMP including LFTS wnl).  Last hemoglobin in our system 10-11.  Denies bleeding.    After BP normalized, symptoms resolved.  Husband states that they haven't been checking home BPs.  Previously records show she had in the past been on amlodipine and valsartan/HCTZ which had been stopped over the years for unclear reasons.  Family doesn't remember the circumstances around these being stopped.  Not on chronic ASA therapy due to [facility] with eliquis.   On interview, pleasant- no  chest pain.  NTG GTT is at 15 mcg/min.     Previous Imaging  LHC 05/23/2014 Native disease with 80% distal left main stenosis, 90% ostial LAD stenosis with competitive flow from a patent LIMA and occluded mid circumflex as well as occluded ramus intermediate stent.  She had widely patent RCA, vein graft to the ramus, and LIMA to the LAD.  There is no significant change in her coronary anatomy since her last catheterization.   TTE 2015 Study Conclusions - Left ventricle: The cavity size was normal. Wall thickness was increased in a pattern of mild LVH. Systolic function was normal. The estimated ejection fraction was in the range of 55% to 60%. Wall motion was normal; there were no regional wall motion abnormalities. Doppler parameters are consistent with high ventricular filling pressure. - Aortic valve: There was mild regurgitation.   Past Medical History:  Diagnosis Date  . Anemia 1940's  . Anginal pain ([place])   . CHF (congestive heart failure) (HCC)    hx  . Chronic anticoagulation    apixaban  . Chronic systolic heart failure ([place]) 10/11/2012  . Complete heart block ([place]) 10/11/2012  . Coronary artery disease   . Depression   . GERD (gastroesophageal reflux disease)   . H/O hiatal hernia   . History of blood transfusion    "related to uretheral OR & when 1st child was born"  . Hyperlipidemia   . Hypertension   . Hypothyroidism   .  Ischemic cardiomyopathy- s/p CABG 10/11/2012   EF 39%  . Migraine    ~ qd for 11 months now (05/23/2014)  . OSA on CPAP 10/11/2012   No longer using CPAP due to worsening of Migraines. AHI was .45/hr at 9 cm water pressure. RDI was 0.4/hr. Patient was unable to reach REM sleep and maintain the supine position at optimal pressure. Patient was able to sleep for prolonged periods of time at 9 cm water pressure without suffering further from respiratory events.  [name] 10/11/2012   Updated to BiV  . Shortness of breath   .  Stress incontinence    multiple injections of collagen  . Thrombocytopenia ([place])   . Weakness 10/06/12    Past Surgical History:  Procedure Laterality Date  . APPENDECTOMY    . BI-VENTRICULAR PACEMAKER REVISION (CRT-R)  11/21/2012   BIV   UPGRADE    WITH DR Caryl Comes  . BI-VENTRICULAR PACEMAKER UPGRADE N/A 10/31/2012   Procedure: BI-VENTRICULAR PACEMAKER UPGRADE;  Surgeon: Deboraha Sprang, MD;  Location: [facility] CATH LAB;  Service: Cardiovascular;  Laterality: N/A;  . BI-VENTRICULAR PACEMAKER UPGRADE N/A 11/21/2012   Procedure: BI-VENTRICULAR PACEMAKER UPGRADE;  Surgeon: Deboraha Sprang, MD;  Location: [facility] CATH LAB;  Service: Cardiovascular;  Laterality: N/A;  . CARDIAC CATHETERIZATION  05/2012   (1st cath 2012) This required recatheterization by Dr. Claiborne Billings and had a patent LIMA-LAD with distal diffuse disease, patent SVG-OM, 10% to 20% mid right coronary but an EF of about 35%, quantitatively iw was 38.9.  [name] CARDIAC CATHETERIZATION     "today;s makes 63" (05/23/2014)  . CARDIAC VALVE REPLACEMENT    . CATARACT EXTRACTION, BILATERAL Bilateral   . CHOLECYSTECTOMY    . CORONARY ANGIOPLASTY WITH STENT PLACEMENT  2012   No significant CAD, patent but ectatic graft supplying the ramus, patent LIMA-LAD, and an EF of 50% at that time. She had an occluded stent to her circumflex from remote intervention. Required recatheterization 05/2012 by Dr. Claiborne Billings and had a patent LIMA-LAD with distal diffuse disease, patent SVG-OM, 10%-20% mid right coronary but an EF of about 35%, quantitatively it was 38.9.  . CORONARY ANGIOPLASTY WITH STENT PLACEMENT     "I have a tota of 4" (05/23/2014)  . CORONARY ARTERY BYPASS GRAFT  2003   With LIMA-LAD and vein graft to OM, and associated mitral valve repair with anuloplasty ring and quadrangular resection of the posterior leaflet, 24 mm Seguin ring by Dr. Roxy Manns.  . INCONTINENCE SURGERY    . INSERT / REPLACE / REMOVE PACEMAKER  2007  . LEFT HEART CATHETERIZATION WITH CORONARY ANGIOGRAM  N/A 06/07/2013   Procedure: LEFT HEART CATHETERIZATION WITH CORONARY ANGIOGRAM;  Surgeon: Lorretta Harp, MD;  Location: [facility] CATH LAB;  Service: Cardiovascular;  Laterality: N/A;  . LEFT HEART CATHETERIZATION WITH CORONARY/GRAFT ANGIOGRAM N/A 05/23/2012   Procedure: LEFT HEART CATHETERIZATION WITH Beatrix Fetters;  Surgeon: Troy Sine, MD;  Location: [facility] CATH LAB;  Service: Cardiovascular;  Laterality: N/A;  . LEFT HEART CATHETERIZATION WITH CORONARY/GRAFT ANGIOGRAM N/A 05/23/2014   Procedure: LEFT HEART CATHETERIZATION WITH Beatrix Fetters;  Surgeon: Wellington Hampshire, MD;  Location: [facility] CATH LAB;  Service: Cardiovascular;  Laterality: N/A;  . lexiscan myocardial stress test  08/20/11   The EF = 34%. This is a low risk scan. There is a new fix abnormality in the distal LAD artery territory. Although no reversibility is seen, high grade obstruction with resting ischemia cannot be excluded.  [name] PACEMAKER GENERATOR CHANGE    .  US ECHOCARDIOGRAPHY  04/13/12   EF - 25% to 35% with intraventricular dyssynchrony, RVSP of 46, moderate TR, and mild to moderate AI. She had a mild increased mitral valve gradient of 5 mmHg with minimal MR.   [name] VAGINAL HYSTERECTOMY      Family History  Problem Relation Age of Onset  . Cancer Father    Social History:  reports that she has never smoked. She has never used smokeless tobacco. She reports that she does not drink alcohol or use drugs.  Allergies:  Allergies  Allergen Reactions  . Dilaudid [Hydromorphone Hcl] Other (See Comments)    "jerking"  . Morphine And Related Other (See Comments)    "jerking"  . Sulfa Antibiotics Other (See Comments)    unknown    No current facility-administered medications on file prior to encounter.    Current Outpatient Prescriptions on File Prior to Encounter  Medication Sig Dispense Refill  . BYSTOLIC 5 MG tablet TAKE ONE (1) TABLET AT BEDTIME 30 tablet 11  . Calcium Carbonate-Vitamin D (CALCIUM  600/VITAMIN D) 600-400 MG-UNIT per tablet Take 1 tablet by mouth daily.    [name] ELIQUIS 2.5 MG TABS tablet TAKE 1 TABLET TWICE DAILY 60 tablet 6  . ergocalciferol (VITAMIN D2) 50000 UNITS capsule Take 50,000 Units by mouth once a week.    . furosemide (LASIX) 20 MG tablet Take 1 tablet (20 mg total) by mouth daily. PLEASE CONTACT OFFICE FOR ADDITIONAL REFILLS 15 tablet 0  . NITROSTAT 0.4 MG SL tablet PLACE 1 TAB UNDER TONGUE EVERY 5 MINUTES UP TO 3 DOSES FOR CHEST PAIN 25 tablet 2  . PARoxetine (PAXIL) 30 MG tablet Take 30 mg by mouth every evening.    . potassium chloride (K-DUR) 10 MEQ tablet Take 10 mEq by mouth as needed (with lasix, fulid, edema).       No results found for this or any previous visit (from the past 48 hour(s)). Dg Chest Port 1 View  Result Date: 09/19/2016 CLINICAL DATA:  Acute onset of heart failure.  Initial encounter. EXAM: PORTABLE CHEST 1 VIEW COMPARISON:  Chest radiograph performed earlier today at 5:12 p.m. FINDINGS: The lungs are well-aerated. Minimal bilateral atelectasis is noted. There is no evidence of pleural effusion or pneumothorax. The cardiomediastinal silhouette is mildly enlarged. The patient is status post median sternotomy, with evidence of prior CABG. A pacemaker is noted overlying the left chest wall, with leads ending overlying the right atrium, right ventricle and coronary sinus all. No acute osseous abnormalities are seen. IMPRESSION: Minimal bilateral atelectasis noted.  Mild cardiomegaly. Electronically Signed   By: Garald Balding M.D.   On: 09/19/2016 00:02    ECG/Tele: ECG at OSH showed sinus rhythm with BiV pacing at 63 with stable TWI V2 and laterally when compared to previous.    ROS: As above. Otherwise, review of systems is negative unless per above HPI  Vitals:   09/18/16 2314 09/18/16 2315 09/18/16 2337  BP:  (!) 165/100 (!) 163/68  Pulse:  64 64  Resp:  19 18  Temp:  98.7 F (37.1 C)   TempSrc:  Oral   SpO2:  100% 100%  Weight:   57.4 kg (126 lb 8.7 oz)   Height: 5\' 3"  (1.6 m) 5\' 3"  (1.6 m)    Wt Readings from Last 10 Encounters:  09/18/16 57.4 kg (126 lb 8.7 oz)  02/14/16 59.9 kg (132 lb)  04/25/15 58.3 kg (128 lb 9.6 oz)  01/18/15 57.6 kg (127 lb)  11/29/14 57.8 kg (127 lb 6.4 oz)  10/12/14 57.2 kg (126 lb)  09/28/14 59.2 kg (130 lb 8.2 oz)  05/24/14 57.7 kg (127 lb 3.3 oz)  04/24/14 57.2 kg (126 lb)  01/03/14 58.1 kg (128 lb)    PE:  General: No acute distress HEENT: Atraumatic, EOMI, mucous membranes moist CV: RRR mild SEM, no gallops. JVD at clavicle at 45 degrees. No HJR. Respiratory: Clear, no crackles. Normal work of breathing ABD: Non-distended and non-tender. No palpable organomegaly.  Extremities: 2+ radial pulses bilaterally. No edema. Neuro/Psych: CN grossly intact, alert and oriented  Assessment/Plan 83 YO with CAD s/p CABG 2003 (LIMA to LAD, SVG to ramus and mitral valve repair with ring), chronic systolic and diastolic heart failure, ischemic EF 25-35% s/p Bi-V ICD 2014 with EF recovery, atrial fibrillation on apixaban accepted as a transfer from [facility] ED with chest pain, likely hypertensive urgency now controlled with low dose NTG gtt.  Significant ischemic CAD history, 1st troponin negative, unclear if ischemia from obstructive lesion or demand is driving pain and HTN or due to being on less BP meds now (no longer on valsartan, HCTZ, or amlodipine for unclear reasons).  Will restart amlodipine, cycle troponin and obtain TTE looking for EF change or WMA.  Pro-BNP elevated at OSH but clinically euvolemic. BNP pending. Also noted is Hgb decrease from [facility] ED from 8.8 to 7.5.    Chest Pain - 325 ASA at OSH; not on home ASA due to chronic AC; 81 mg ASA not ordered pending anemia workup- determine if home med pending clinical course - cycle troponin; will be conservative with UFH given anemia - TTE - Previously on statin, since D/C'd (unclear reasons but I suspect due to dementia) -  Continue BB with Bystolic - given anemia, this could be exacerbated by symptomatic anemia  HTN Urgency - No symptoms of end organ damage other than chest pain - Continue Bystolic - Restarting amlodipine 5 mg - Wean NTG gtt as able  Thrombocytopenia, Normocytic Anemia. No report of GIB.  Last HGB 11.4 09/2014, 8.8 earlier today now 7.5. - Iron, ferritin, B12, Folate, differential added on to blood work - Cycle H/H, will type/screen and involve GI as needed - Anticoagulation on hold pending HGB trend  CKD - at baseline  Chronic diastolic heart failure with history of systolic heart failure, most recent EF recovered - Continue home lasix, TTE as above  Paroxysmal Atrial Fibrillation - ECG appears sinus, continue eliquis 2.5 mg BID  Dementia, Depression - Paxil, Buspar, Aricept  Lolita Cram Means  MD 09/19/2016, 12:50 AM

## 2016-09-19 NOTE — Progress Notes (Signed)
Admission H&P reviewed and patient examined- RRR, lungs clear, no edema, vitals stable. Ruled-out for ACS overnight - BP improved on nitroglycerin gtts. Mildly elevated BNP. Noted to be stably anemic- H/H 7/25 - this appears to be iron-deficiency (iron 27, trans sat% 7), possibly from chronic blood loss or renal disease (creatinine 1.32 - GFR around 40). On Eliquis 2.5 BID at home-currently being held. Will check stool guaiac. Family says that they had plans for outpatient endoscopy. I don't see an ischemia evaluation since 2013. Would recommend stress test tomorrow - still has low grade chest pain, but belching as well. Suspect reflux. Wean nitro gtts -transition to oral meds. Keep NPO p MN.  Time Spent with patient:  20 minutes   Pixie Casino, MD, [facility] Attending Cardiologist [place]

## 2016-09-20 ENCOUNTER — Inpatient Hospital Stay (HOSPITAL_COMMUNITY): Payer: Medicare Other

## 2016-09-20 DIAGNOSIS — Z7901 Long term (current) use of anticoagulants: Secondary | ICD-10-CM

## 2016-09-20 DIAGNOSIS — D649 Anemia, unspecified: Secondary | ICD-10-CM

## 2016-09-20 DIAGNOSIS — R079 Chest pain, unspecified: Secondary | ICD-10-CM

## 2016-09-20 DIAGNOSIS — D509 Iron deficiency anemia, unspecified: Secondary | ICD-10-CM

## 2016-09-20 DIAGNOSIS — R071 Chest pain on breathing: Secondary | ICD-10-CM

## 2016-09-20 DIAGNOSIS — R195 Other fecal abnormalities: Secondary | ICD-10-CM

## 2016-09-20 LAB — BASIC METABOLIC PANEL
Anion gap: 9 (ref 5–15)
BUN: 13 mg/dL (ref 6–20)
CHLORIDE: 99 mmol/L — AB (ref 101–111)
CO2: 28 mmol/L (ref 22–32)
Calcium: 8.6 mg/dL — ABNORMAL LOW (ref 8.9–10.3)
Creatinine, Ser: 1.32 mg/dL — ABNORMAL HIGH (ref 0.44–1.00)
GFR, EST AFRICAN AMERICAN: 42 mL/min — AB (ref >60)
GFR, EST NON AFRICAN AMERICAN: 36 mL/min — AB (ref >60)
Glucose, Bld: 119 mg/dL — ABNORMAL HIGH (ref 65–99)
Potassium: 3.8 mmol/L (ref 3.5–5.1)
SODIUM: 136 mmol/L (ref 135–145)

## 2016-09-20 LAB — CBC
HCT: 25.3 % — ABNORMAL LOW (ref 36.0–46.0)
HEMATOCRIT: 26.6 % — AB (ref 36.0–46.0)
HEMOGLOBIN: 8 g/dL — AB (ref 12.0–15.0)
Hemoglobin: 7.6 g/dL — ABNORMAL LOW (ref 12.0–15.0)
MCH: 24.4 pg — ABNORMAL LOW (ref 26.0–34.0)
MCH: 24.5 pg — AB (ref 26.0–34.0)
MCHC: 30 g/dL (ref 30.0–36.0)
MCHC: 30.1 g/dL (ref 30.0–36.0)
MCV: 81.1 fL (ref 78.0–100.0)
MCV: 81.6 fL (ref 78.0–100.0)
PLATELETS: 129 10*3/uL — AB (ref 150–400)
Platelets: 127 10*3/uL — ABNORMAL LOW (ref 150–400)
RBC: 3.12 MIL/uL — ABNORMAL LOW (ref 3.87–5.11)
RBC: 3.26 MIL/uL — AB (ref 3.87–5.11)
RDW: 17 % — AB (ref 11.5–15.5)
RDW: 17.1 % — ABNORMAL HIGH (ref 11.5–15.5)
WBC: 4.8 10*3/uL (ref 4.0–10.5)
WBC: 4.6 10*3/uL (ref 4.0–10.5)

## 2016-09-20 LAB — NM MYOCAR MULTI W/SPECT W/WALL MOTION / EF: Rest HR: 82 {beats}/min

## 2016-09-20 LAB — OCCULT BLOOD X 1 CARD TO LAB, STOOL: Fecal Occult Bld: POSITIVE — AB

## 2016-09-20 LAB — PROTIME-INR
INR: 1.14
Prothrombin Time: 14.6 seconds (ref 11.4–15.2)

## 2016-09-20 MED ORDER — REGADENOSON 0.4 MG/5ML IV SOLN
INTRAVENOUS | Status: AC
  Administered 2016-09-20: 12:00:00
  Filled 2016-09-20: qty 5

## 2016-09-20 MED ORDER — ONDANSETRON HCL 4 MG/2ML IJ SOLN
4.0000 mg | Freq: Once | INTRAMUSCULAR | Status: AC
  Administered 2016-09-20: 4 mg via INTRAVENOUS

## 2016-09-20 MED ORDER — TECHNETIUM TC 99M TETROFOSMIN IV KIT
30.0000 | PACK | Freq: Once | INTRAVENOUS | Status: AC | PRN
  Administered 2016-09-20: 30 via INTRAVENOUS

## 2016-09-20 MED ORDER — SODIUM CHLORIDE 0.9 % IV SOLN
INTRAVENOUS | Status: DC
  Administered 2016-09-20: 12:00:00 via INTRAVENOUS

## 2016-09-20 MED ORDER — REGADENOSON 0.4 MG/5ML IV SOLN
0.4000 mg | Freq: Once | INTRAVENOUS | Status: AC
  Administered 2016-09-20: 0.4 mg via INTRAVENOUS
  Filled 2016-09-20: qty 5

## 2016-09-20 MED ORDER — PANTOPRAZOLE SODIUM 40 MG PO TBEC: 40.0000 mg | DELAYED_RELEASE_TABLET | Freq: Two times a day (BID) | ORAL | Status: DC

## 2016-09-20 MED ORDER — PANTOPRAZOLE SODIUM 40 MG IV SOLR
40.0000 mg | Freq: Two times a day (BID) | INTRAVENOUS | Status: DC
  Administered 2016-09-20 – 2016-09-23 (×7): 40 mg via INTRAVENOUS
  Filled 2016-09-20 (×7): qty 40

## 2016-09-20 MED ORDER — TECHNETIUM TC 99M TETROFOSMIN IV KIT
10.0000 | PACK | Freq: Once | INTRAVENOUS | Status: AC | PRN
  Administered 2016-09-20: 10 via INTRAVENOUS

## 2016-09-20 MED ORDER — SODIUM CHLORIDE 0.9 % IV SOLN
INTRAVENOUS | Status: AC
  Administered 2016-09-20 (×2): via INTRAVENOUS

## 2016-09-20 NOTE — Progress Notes (Addendum)
I transported stool card to the lab because unable to send via tube station from nuc med. Requested stat from lab, got verbal from lab it was positive. She had a significant amount of loose black stool in nuc med (incontinent of such) but this has ceased for now. Spoke with Dr Debara Pickett. Start IV fluids x 6 hours. Have consulted GI. Will send stat CBC and PT/INR. (Note recently on Eliquis but may be helpful to trend in future if needed.) Keep NPO for now.  Merita Hawks PA-C

## 2016-09-20 NOTE — Progress Notes (Addendum)
Pt had black tarry stool in Nuc Med prior to Lexi scan this am.  No heme testing supplies here, floor RN informed.  Formed stool initially then loose, then liquid.  VSS, ready for stress test. Dr Debara Pickett and Melina Copa informed

## 2016-09-20 NOTE — Progress Notes (Signed)
Progress Note  Patient Name: [name] Date of Encounter: 09/20/2016  Primary Cardiologist: Dr. Claiborne Billings  Subjective   Feels washed out, but no chest pain this AM. Seen down in nuc med. Had black stool down in nuc med but started on iron yesterday. Dr. Debara Pickett recommended to proceed with nuc and try to guaiac stool.  Inpatient Medications    Scheduled Meds: . amLODipine  5 mg Oral Daily  . busPIRone  10 mg Oral BID  . donepezil  10 mg Oral QHS  . furosemide  20 mg Oral Daily  . iron polysaccharides  150 mg Oral Daily  . nebivolol  5 mg Oral Daily  . pantoprazole  40 mg Oral Daily  . PARoxetine  30 mg Oral QPM  . regadenoson  0.4 mg Intravenous Once   Continuous Infusions:  PRN Meds: acetaminophen, alum & mag hydroxide-simeth, ondansetron (ZOFRAN) IV   Vital Signs    Vitals:   09/20/16 0800 09/20/16 0844 09/20/16 0900 09/20/16 1045  BP: (!) 155/64 (!) 168/56 (!) 172/66 (!) 159/63  Pulse: (!) 58 67 67 60  Resp: 17 (!) 21 (!) 21 18  Temp:  98.3 F (36.8 C)    TempSrc:  Oral    SpO2: 99% 95% 93% 98%  Weight:      Height:        Intake/Output Summary (Last 24 hours) at 09/20/16 1115 Last data filed at 09/19/16 1900  Gross per 24 hour  Intake                0 ml  Output              900 ml  Net             -900 ml   Filed Weights   09/18/16 2315  Weight: 126 lb 8.7 oz (57.4 kg)    Telemetry    V paced - Personally Reviewed  Physical Exam   GEN: No acute distress.  HEENT: Normocephalic, atraumatic, sclera non-icteric. Neck: No JVD or bruits. Cardiac: RRR no murmurs, rubs, or gallops.  Radials/DP/PT 1+ and equal bilaterally.  Respiratory: Clear to auscultation bilaterally. Breathing is unlabored. GI: Soft, nontender, non-distended, BS +x 4. MS: no deformity. Extremities: No clubbing or cyanosis. No edema. Distal pedal pulses are 2+ and equal bilaterally. Neuro:  AAOx3. Follows commands. Some memory difficulty noted but able to answer basic orientation  questions, relay history and interact appropriately. Psych:  Responds to questions appropriately with a normal affect.  Labs    Chemistry Recent Labs Lab 09/18/16 0030 09/20/16 0233  NA 138 136  K 3.6 3.8  CL 102 99*  CO2 28 28  GLUCOSE 105* 119*  BUN 13 13  CREATININE 1.32* 1.32*  CALCIUM 8.6* 8.6*  PROT 5.5*  --   ALBUMIN 3.4*  --   AST 17  --   ALT 11*  --   ALKPHOS 66  --   BILITOT 0.9  --   GFRNONAA 36* 36*  GFRAA 42* 42*  ANIONGAP 8 9     Hematology Recent Labs Lab 09/18/16 0030 09/19/16 0227 09/19/16 0736 09/20/16 0233  WBC 4.7 5.0  --  4.8  RBC 3.10* 3.07*  --  3.12*  HGB 7.5* 7.6* 7.5* 7.6*  HCT 24.9* 24.8* 25.3* 25.3*  MCV 80.3 80.8  --  81.1  MCH 24.2* 24.8*  --  24.4*  MCHC 30.1 30.6  --  30.0  RDW 16.9* 16.8*  --  17.0*  PLT 137* 138*  --  127*    Cardiac Enzymes Recent Labs Lab 09/18/16 0030 09/19/16 0736  TROPONINI 0.03* <0.03   No results for input(s): TROPIPOC in the last 168 hours.   BNP Recent Labs Lab 09/18/16 0030  BNP 324.2*     DDimer No results for input(s): DDIMER in the last 168 hours.   Radiology    Dg Chest Port 1 View  Result Date: 09/19/2016 CLINICAL DATA:  Acute onset of heart failure.  Initial encounter. EXAM: PORTABLE CHEST 1 VIEW COMPARISON:  Chest radiograph performed earlier today at 5:12 p.m. FINDINGS: The lungs are well-aerated. Minimal bilateral atelectasis is noted. There is no evidence of pleural effusion or pneumothorax. The cardiomediastinal silhouette is mildly enlarged. The patient is status post median sternotomy, with evidence of prior CABG. A pacemaker is noted overlying the left chest wall, with leads ending overlying the right atrium, right ventricle and coronary sinus all. No acute osseous abnormalities are seen. IMPRESSION: Minimal bilateral atelectasis noted.  Mild cardiomegaly. Electronically Signed   By: Garald Balding M.D.   On: 09/19/2016 00:02    Cardiac Studies   Pending.  Patient  Profile     83 y.o. female with CAD s/p CABG 2003 (LIMA to LAD, SVG to ramus and mitral valve repair with ring), previous PCI of her native vessels, stable cath 2015, OSA, HTN, HLD, CKD III, TIA, anemia, chronic systolic and diastolic heart failure, ischemic EF 25-35% s/p Bi-V ICD 2014 with EF recovery to 55% in 2015, dementia, atrial fibrillation on apixaban, seizure last month pending EEG who was admitted with chest pain, accelerated HTN and hemoglobin in the 7 range (previously 11).   Assessment & Plan    1. Chest pain with minimally elevated troponin, prior CAD, PCI, CABG 2. Anemia, ? Slow GIB 3. Accelerated HTN 4. CKD stage III 5. Chronic combined CHF with prior EF recovery  Patient seen briefly in nuc med. Dr. Debara Pickett aware of black stool, unclear if this is related to iron supplementation or actual slow GIB. This was not tested because nuc med did not have the supplies available. She had another black stool just a few minutes ago, no overt bleeding or BRBPR. Nurse from 4th floor will be sending down a hemoccult card momentarily. Will increase PPI to BID empirically. Pending nuc med results. Anticoag remains on hold at this time.  Signed, Charlie Pitter, PA-C  09/20/2016, 11:15 AM

## 2016-09-20 NOTE — Consult Note (Signed)
Referring Provider: [facility] Primary Care Physician:  Myrlene Broker, MD Primary Gastroenterologist: Sandria Senter, MD in [facility]  Reason for Consultation:  GI bleed    Attending physician's note   I have taken a history, examined the patient and reviewed the chart. I agree with the Advanced Practitioner's note, impression and recommendations. Hospitalized for chest pain, possible cardiac etiology. Heme + black stool noted today, pt on iron. She has a new anemia.Recent history of morning nausea improved on a PPI and Dr. Lyda Jester planned an EGD in the near future. Eliquis on hold. Tentatively plan for EGD tomorrow if cleared by Cardiology. If EGD negative will proceed with colonoscopy if cleared by Cardiology. IV PPI. Hb is now 8. Trend CBC.   Lucio Edward, MD [facility]    ASSESSMENT AND PLAN:  63. 83 yo female with nausea, black, heme positive stools on iron, and new anemia on Eliquis. Recently saw GI in [place] for nausea. EGD was planned but put on hold until seizure workup complete.  -For further evaluation patient will need EGD off Eliquis (if cleared by Cardiology). The risks and benefits of EGD were discussed and the patient and her husband and she agrees to proceed. Will plan for tomorrow pending Cardiology's clearance.  -PPI IV bid -Monitor CBC, transfuse if needed.  -Eliquis is on hold -hold Niferex for now.   2. ABL. Hgb in 2016 was ~ 11, it was 8.8 at [facility] and has remained relatively stable over last few days at 8.0.  -will recheck CBC since patient had several black stools earlier today (on iron)  2. Multiple medical problems not limited to dementia, CAD / chronic systolic and diastolic heart failure s/ Bi-V ICD, Afib on Fliquis.   HPI: [name] is a 83 y.o. female with multiple medical problems including a complex cardiac history. She has CAD, s/p remote CABG, PCI, chronic systolic and diastolic heart failure s/p Bi-V ICD, AFib on Eliquis, CKD,  dementia, and seizures (last month). Patient was admitted to [facility] a few days ago for chest pressure, nausea and elevated BP Troponin was negative but hgb found to be 8.8. BNP was 4590. BP normalized and symptoms resolved. Eliquis was held pending anemia workup. She was transferred to [facility] for ischemic evaluation. Her ferritin is 21, TIBC 341.   Husband helps with the history. While in [place] getting cardiac imaging patient passed a black formed stool. She subsequently began passing more liquid black stools. She may have gotten oral iron at [place] but stools are still heme positive. Patient has chronic loose stools, often unable to make it to bathroom in time. She recently saw GI in [place] for evaluation of nausea. Sounds like an EGD was planned but then put on hold until completion of an EEG scheduled for evaluation of new onset seizures. She hasn't had any vomiting. No weight loss. No abdominal pain. Apparently had a colonoscopy > 10 years ago.    Past Medical History:  Diagnosis Date  . Anemia 1940's  . Anginal pain ([place])   . CHF (congestive heart failure) (HCC)    hx  . Chronic anticoagulation    apixaban  . Chronic systolic heart failure ([place]) 10/11/2012  . Complete heart block ([place]) 10/11/2012  . Coronary artery disease   . Depression   . GERD (gastroesophageal reflux disease)   . H/O hiatal hernia   . History of blood transfusion    "related to uretheral OR & when 1st child was born"  .  Hyperlipidemia   . Hypertension   . Hypothyroidism   . Ischemic cardiomyopathy- s/p CABG 10/11/2012   EF 39%  . Migraine    ~ qd for 11 months now (05/23/2014)  . OSA on CPAP 10/11/2012   No longer using CPAP due to worsening of Migraines. AHI was .45/hr at 9 cm water pressure. RDI was 0.4/hr. Patient was unable to reach REM sleep and maintain the supine position at optimal pressure. Patient was able to sleep for prolonged periods of time at 9 cm water pressure without suffering further  from respiratory events.  [name] 10/11/2012   Updated to BiV  . Shortness of breath   . Stress incontinence    multiple injections of collagen  . Thrombocytopenia ([place])   . Weakness 10/06/12    Past Surgical History:  Procedure Laterality Date  . APPENDECTOMY    . BI-VENTRICULAR PACEMAKER REVISION (CRT-R)  11/21/2012   BIV   UPGRADE    WITH DR Caryl Comes  . BI-VENTRICULAR PACEMAKER UPGRADE N/A 10/31/2012   Procedure: BI-VENTRICULAR PACEMAKER UPGRADE;  Surgeon: Deboraha Sprang, MD;  Location: [facility] CATH LAB;  Service: Cardiovascular;  Laterality: N/A;  . BI-VENTRICULAR PACEMAKER UPGRADE N/A 11/21/2012   Procedure: BI-VENTRICULAR PACEMAKER UPGRADE;  Surgeon: Deboraha Sprang, MD;  Location: [facility] CATH LAB;  Service: Cardiovascular;  Laterality: N/A;  . CARDIAC CATHETERIZATION  05/2012   (1st cath 2012) This required recatheterization by Dr. Claiborne Billings and had a patent LIMA-LAD with distal diffuse disease, patent SVG-OM, 10% to 20% mid right coronary but an EF of about 35%, quantitatively iw was 38.9.  [name] CARDIAC CATHETERIZATION     "today;s makes 7" (05/23/2014)  . CARDIAC VALVE REPLACEMENT    . CATARACT EXTRACTION, BILATERAL Bilateral   . CHOLECYSTECTOMY    . CORONARY ANGIOPLASTY WITH STENT PLACEMENT  2012   No significant CAD, patent but ectatic graft supplying the ramus, patent LIMA-LAD, and an EF of 50% at that time. She had an occluded stent to her circumflex from remote intervention. Required recatheterization 05/2012 by Dr. Claiborne Billings and had a patent LIMA-LAD with distal diffuse disease, patent SVG-OM, 10%-20% mid right coronary but an EF of about 35%, quantitatively it was 38.9.  . CORONARY ANGIOPLASTY WITH STENT PLACEMENT     "I have a tota of 4" (05/23/2014)  . CORONARY ARTERY BYPASS GRAFT  2003   With LIMA-LAD and vein graft to OM, and associated mitral valve repair with anuloplasty ring and quadrangular resection of the posterior leaflet, 24 mm Seguin ring by Dr. Roxy Manns.  . INCONTINENCE  SURGERY    . INSERT / REPLACE / REMOVE PACEMAKER  2007  . LEFT HEART CATHETERIZATION WITH CORONARY ANGIOGRAM N/A 06/07/2013   Procedure: LEFT HEART CATHETERIZATION WITH CORONARY ANGIOGRAM;  Surgeon: Lorretta Harp, MD;  Location: [facility] CATH LAB;  Service: Cardiovascular;  Laterality: N/A;  . LEFT HEART CATHETERIZATION WITH CORONARY/GRAFT ANGIOGRAM N/A 05/23/2012   Procedure: LEFT HEART CATHETERIZATION WITH Beatrix Fetters;  Surgeon: Troy Sine, MD;  Location: [facility] CATH LAB;  Service: Cardiovascular;  Laterality: N/A;  . LEFT HEART CATHETERIZATION WITH CORONARY/GRAFT ANGIOGRAM N/A 05/23/2014   Procedure: LEFT HEART CATHETERIZATION WITH Beatrix Fetters;  Surgeon: Wellington Hampshire, MD;  Location: [place] CATH LAB;  Service: Cardiovascular;  Laterality: N/A;  . lexiscan myocardial stress test  08/20/11   The EF = 34%. This is a low risk scan. There is a new fix abnormality in the distal LAD artery territory. Although no reversibility is seen, high grade obstruction with  resting ischemia cannot be excluded.  [name] PACEMAKER GENERATOR CHANGE    . US ECHOCARDIOGRAPHY  04/13/12   EF - 25% to 35% with intraventricular dyssynchrony, RVSP of 46, moderate TR, and mild to moderate AI. She had a mild increased mitral valve gradient of 5 mmHg with minimal MR.   [name] VAGINAL HYSTERECTOMY      Prior to Admission medications   Medication Sig Start Date End Date Taking? Authorizing Provider  busPIRone (BUSPAR) 10 MG tablet Take 10 mg by mouth 2 (two) times daily.   Yes Historical Provider, MD  BYSTOLIC 5 MG tablet TAKE ONE (1) TABLET AT BEDTIME 06/24/16  Yes Troy Sine, MD  donepezil (ARICEPT) 10 MG tablet Take 10 mg by mouth at bedtime.   Yes Historical Provider, MD  ELIQUIS 2.5 MG TABS tablet TAKE 1 TABLET TWICE DAILY 02/11/15  Yes Deboraha Sprang, MD  ergocalciferol (VITAMIN D2) 50000 UNITS capsule Take 50,000 Units by mouth once a week.   Yes Historical Provider, MD  furosemide (LASIX) 20 MG tablet Take  1 tablet (20 mg total) by mouth daily. PLEASE CONTACT OFFICE FOR ADDITIONAL REFILLS 09/18/16  Yes Troy Sine, MD  omeprazole (PRILOSEC) 20 MG capsule Take 20 mg by mouth daily.   Yes Historical Provider, MD  PARoxetine (PAXIL) 30 MG tablet Take 30 mg by mouth every evening.   Yes Historical Provider, MD  Calcium Carbonate-Vitamin D (CALCIUM 600/VITAMIN D) 600-400 MG-UNIT per tablet Take 1 tablet by mouth daily.    Historical Provider, MD  NITROSTAT 0.4 MG SL tablet PLACE 1 TAB UNDER TONGUE EVERY 5 MINUTES UP TO 3 DOSES FOR CHEST PAIN 12/12/14   Brittainy Erie Noe, PA-C  potassium chloride (K-DUR) 10 MEQ tablet Take 10 mEq by mouth as needed (with lasix, fulid, edema).     Historical Provider, MD    Current Facility-Administered Medications  Medication Dose Route Frequency Provider Last Rate Last Dose  . 0.9 %  sodium chloride infusion   Intravenous Continuous Dayna N Dunn, PA-C      . acetaminophen (TYLENOL) tablet 650 mg  650 mg Oral Q4H PRN Lolita Cram Means, MD   650 mg at 09/19/16 2221  . alum & mag hydroxide-simeth (MAALOX/MYLANTA) 200-200-20 MG/5ML suspension 15 mL  15 mL Oral Daily PRN Pixie Casino, MD   15 mL at 09/19/16 1322  . amLODipine (NORVASC) tablet 5 mg  5 mg Oral Daily Lolita Cram Means, MD   5 mg at 09/20/16 4401  . busPIRone (BUSPAR) tablet 10 mg  10 mg Oral BID Lolita Cram Means, MD   10 mg at 09/20/16 0272  . donepezil (ARICEPT) tablet 10 mg  10 mg Oral QHS Lolita Cram Means, MD   10 mg at 09/19/16 2220  . furosemide (LASIX) tablet 20 mg  20 mg Oral Daily Lolita Cram Means, MD   20 mg at 09/20/16 5366  . iron polysaccharides (NIFEREX) capsule 150 mg  150 mg Oral Daily Pixie Casino, MD   150 mg at 09/20/16 0924  . nebivolol (BYSTOLIC) tablet 5 mg  5 mg Oral Daily Lolita Cram Means, MD   5 mg at 09/20/16 4403  . ondansetron (ZOFRAN) injection 4 mg  4 mg Intravenous Q6H PRN Lolita Cram Means, MD   4 mg at 09/19/16 2342  . pantoprazole (PROTONIX) EC  tablet 40 mg  40 mg Oral BID Dayna N Dunn, PA-C      . PARoxetine (PAXIL) tablet 30 mg  30 mg  Oral QPM Lolita Cram Means, MD   30 mg at 09/19/16 1804  . regadenoson (LEXISCAN) 0.4 MG/5ML injection SOLN             Allergies as of 09/18/2016 - Review Complete 02/14/2016  Allergen Reaction Noted  . Dilaudid [hydromorphone hcl] Other (See Comments) 05/18/2012  . Morphine and related Other (See Comments) 05/18/2012  . Sulfa antibiotics Other (See Comments) 05/18/2012    Family History  Problem Relation Age of Onset  . Cancer Father     Social History   Social History  . Marital status: Married    Spouse name: N/A  . Number of children: N/A  . Years of education: N/A   Occupational History  . Not on file.   Social History Main Topics  . Smoking status: Never Smoker  . Smokeless tobacco: Never Used  . Alcohol use No  . Drug use: No  . Sexual activity: Not Currently   Other Topics Concern  . Not on file   Social History Narrative  . No narrative on file    Review of Systems: All systems reviewed and negative except where noted in HPI.  Physical Exam: Vital signs in last 24 hours: Temp:  [97.6 F (36.4 C)-99 F (37.2 C)] 98.3 F (36.8 C) (04/01 0844) Pulse Rate:  [58-78] 63 (04/01 1200) Resp:  [15-27] 17 (04/01 1200) BP: (138-176)/(44-66) 173/53 (04/01 1150) SpO2:  [92 %-99 %] 99 % (04/01 1200) Last BM Date: 09/18/16 General:   Alert,  Pleasant white female in NAD Eyes:  Sclera clear, no icterus.   Conjunctiva pink. Ears:  Normal auditory acuity. Nose:  No deformity, discharge,  or lesions. Neck:  Supple; no masses Lungs:  Clear throughout to auscultation.   No wheezes, crackles, or rhonchi.  Heart:  Regular rate and rhythm , no edema Abdomen:  Soft,nontender, BS active,no palp mass or hsm.   Rectal:  Deferred  Msk:  Symmetrical without gross deformities. . Pulses:  Normal pulses noted. Extremities:  Without clubbing or edema. Neurologic:  Alert and   oriented x4;  grossly normal neurologically. Skin:  Intact without significant lesions or rashes.. Psych:  Alert and cooperative. Normal mood and affect.  Intake/Output from previous day: 03/31 0701 - 04/01 0700 In: -  Out: 900 [Urine:900] Intake/Output this shift: No intake/output data recorded.  Lab Results:  Recent Labs  09/18/16 0030 09/19/16 0227 09/19/16 0736 09/20/16 0233  WBC 4.7 5.0  --  4.8  HGB 7.5* 7.6* 7.5* 7.6*  HCT 24.9* 24.8* 25.3* 25.3*  PLT 137* 138*  --  127*   BMET  Recent Labs  09/18/16 0030 09/20/16 0233  NA 138 136  K 3.6 3.8  CL 102 99*  CO2 28 28  GLUCOSE 105* 119*  BUN 13 13  CREATININE 1.32* 1.32*  CALCIUM 8.6* 8.6*   LFT  Recent Labs  09/18/16 0030  PROT 5.5*  ALBUMIN 3.4*  AST 17  ALT 11*  ALKPHOS 66  BILITOT 0.9   PT/INR No results for input(s): LABPROT, INR in the last 72 hours. Hepatitis Panel No results for input(s): HEPBSAG, HCVAB, HEPAIGM, HEPBIGM in the last 72 hours.   Studies/Results: Dg Chest Port 1 View  Result Date: 09/19/2016 CLINICAL DATA:  Acute onset of heart failure.  Initial encounter. EXAM: PORTABLE CHEST 1 VIEW COMPARISON:  Chest radiograph performed earlier today at 5:12 p.m. FINDINGS: The lungs are well-aerated. Minimal bilateral atelectasis is noted. There is no evidence of pleural effusion or pneumothorax. The  cardiomediastinal silhouette is mildly enlarged. The patient is status post median sternotomy, with evidence of prior CABG. A pacemaker is noted overlying the left chest wall, with leads ending overlying the right atrium, right ventricle and coronary sinus all. No acute osseous abnormalities are seen. IMPRESSION: Minimal bilateral atelectasis noted.  Mild cardiomegaly. Electronically Signed   By: Garald Balding M.D.   On: 09/19/2016 00:02    Tye Savoy, NP-C @  09/20/2016, 1:08 PM  Pager number [phone]

## 2016-09-20 NOTE — Progress Notes (Signed)
Blackish,green stool heme test sent to lab X 2 from nuc med

## 2016-09-21 ENCOUNTER — Inpatient Hospital Stay (HOSPITAL_COMMUNITY): Payer: Medicare Other | Admitting: Certified Registered"

## 2016-09-21 ENCOUNTER — Encounter (HOSPITAL_COMMUNITY)
Admission: AD | Disposition: A | Discharge status: Home Health | Payer: Self-pay | Source: Other Acute Inpatient Hospital | Attending: Cardiology

## 2016-09-21 ENCOUNTER — Encounter (HOSPITAL_COMMUNITY): Payer: Self-pay | Admitting: Gastroenterology

## 2016-09-21 ENCOUNTER — Inpatient Hospital Stay (HOSPITAL_COMMUNITY): Payer: Medicare Other

## 2016-09-21 DIAGNOSIS — R072 Precordial pain: Secondary | ICD-10-CM

## 2016-09-21 DIAGNOSIS — R071 Chest pain on breathing: Secondary | ICD-10-CM

## 2016-09-21 DIAGNOSIS — R110 Nausea: Secondary | ICD-10-CM

## 2016-09-21 DIAGNOSIS — D500 Iron deficiency anemia secondary to blood loss (chronic): Secondary | ICD-10-CM

## 2016-09-21 HISTORY — PX: ESOPHAGOGASTRODUODENOSCOPY: SHX5428

## 2016-09-21 LAB — ECHOCARDIOGRAM COMPLETE
Height: 63 in
Weight: 2028.23 oz

## 2016-09-21 LAB — FOLATE RBC
Folate, Hemolysate: 372.1 ng/mL
Folate, RBC: 1500 ng/mL (ref >498)
HEMATOCRIT: 24.8 % — AB (ref 34.0–46.6)

## 2016-09-21 LAB — CBC
HCT: 24.8 % — ABNORMAL LOW (ref 36.0–46.0)
HEMOGLOBIN: 7.4 g/dL — AB (ref 12.0–15.0)
MCH: 24.1 pg — ABNORMAL LOW (ref 26.0–34.0)
MCHC: 29.8 g/dL — AB (ref 30.0–36.0)
MCV: 80.8 fL (ref 78.0–100.0)
Platelets: 132 10*3/uL — ABNORMAL LOW (ref 150–400)
RBC: 3.07 MIL/uL — AB (ref 3.87–5.11)
RDW: 16.9 % — ABNORMAL HIGH (ref 11.5–15.5)
WBC: 4.4 10*3/uL (ref 4.0–10.5)

## 2016-09-21 LAB — BASIC METABOLIC PANEL
Anion gap: 8 (ref 5–15)
BUN: 11 mg/dL (ref 6–20)
CO2: 28 mmol/L (ref 22–32)
Calcium: 8.5 mg/dL — ABNORMAL LOW (ref 8.9–10.3)
Chloride: 102 mmol/L (ref 101–111)
Creatinine, Ser: 1.14 mg/dL — ABNORMAL HIGH (ref 0.44–1.00)
GFR, EST AFRICAN AMERICAN: 50 mL/min — AB (ref >60)
GFR, EST NON AFRICAN AMERICAN: 43 mL/min — AB (ref >60)
Glucose, Bld: 90 mg/dL (ref 65–99)
Potassium: 3.8 mmol/L (ref 3.5–5.1)
SODIUM: 138 mmol/L (ref 135–145)

## 2016-09-21 SURGERY — EGD (ESOPHAGOGASTRODUODENOSCOPY)
Anesthesia: Monitor Anesthesia Care

## 2016-09-21 MED ORDER — LIDOCAINE HCL (CARDIAC) 20 MG/ML IV SOLN
INTRAVENOUS | Status: DC | PRN
  Administered 2016-09-21: 100 mg via INTRATRACHEAL

## 2016-09-21 MED ORDER — MEPERIDINE HCL 100 MG/ML IJ SOLN
6.2500 mg | INTRAMUSCULAR | Status: DC | PRN
Start: 2016-09-21 — End: 2016-09-21

## 2016-09-21 MED ORDER — ONDANSETRON HCL 4 MG/2ML IJ SOLN: 4.0000 mg | Freq: Once | INTRAMUSCULAR | Status: DC | PRN

## 2016-09-21 MED ORDER — LACTATED RINGERS IV SOLN
INTRAVENOUS | Status: DC | PRN
  Administered 2016-09-21: 13:00:00 via INTRAVENOUS

## 2016-09-21 MED ORDER — METOCLOPRAMIDE HCL 5 MG/ML IJ SOLN
5.0000 mg | Freq: Once | INTRAMUSCULAR | Status: AC
  Administered 2016-09-22: 5 mg via INTRAVENOUS
  Filled 2016-09-21: qty 2

## 2016-09-21 MED ORDER — METOCLOPRAMIDE HCL 5 MG/ML IJ SOLN
5.0000 mg | Freq: Once | INTRAMUSCULAR | Status: AC
  Administered 2016-09-21: 5 mg via INTRAVENOUS
  Filled 2016-09-21: qty 2

## 2016-09-21 MED ORDER — PEG-KCL-NACL-NASULF-NA ASC-C 100 G PO SOLR
1.0000 | Freq: Once | ORAL | Status: AC
  Administered 2016-09-21: 200 g via ORAL
  Filled 2016-09-21 (×2): qty 1

## 2016-09-21 MED ORDER — PROPOFOL 10 MG/ML IV BOLUS
INTRAVENOUS | Status: DC | PRN
  Administered 2016-09-21: 30 mg via INTRAVENOUS

## 2016-09-21 MED ORDER — PROPOFOL 500 MG/50ML IV EMUL
INTRAVENOUS | Status: DC | PRN
  Administered 2016-09-21: 75 ug/kg/min via INTRAVENOUS

## 2016-09-21 NOTE — Progress Notes (Signed)
Order for flexi seal given. Multiple attempts to place with no success. Fully inflated the tube is dislodged each time, insufficient anal tone to keep tube in place. Alerted Camera operator.

## 2016-09-21 NOTE — Transfer of Care (Signed)
Immediate Anesthesia Transfer of Care Note  Patient: [name]  Procedure(s) Performed: Procedure(s): ESOPHAGOGASTRODUODENOSCOPY (EGD) (N/A)  Patient Location: Endoscopy Unit  Anesthesia Type:General  Level of Consciousness: awake, alert , sedated and patient cooperative  Airway & Oxygen Therapy: Patient Spontanous Breathing and Patient connected to nasal cannula oxygen  Post-op Assessment: Report given to RN and Post -op Vital signs reviewed and stable  Post vital signs: Reviewed and stable  Last Vitals:  Vitals:   09/21/16 1100 09/21/16 1258  BP:  (!) 178/51  Pulse: 69 60  Resp: 16 17  Temp:  36.6 C    Last Pain:  Vitals:   09/21/16 1258  TempSrc: Oral  PainSc:          Complications: No apparent anesthesia complications

## 2016-09-21 NOTE — Progress Notes (Signed)
  Echocardiogram 2D Echocardiogram has been performed.  [name] 09/21/2016, 11:37 AM

## 2016-09-21 NOTE — Op Note (Signed)
[facility] Patient Name: [name] Procedure Date : 09/21/2016 MRN: [id number] Attending MD: Mauri Pole , MD Date of Birth: [birth year] CSN: [id number] Age: 83 Admit Type: Inpatient Procedure:                Upper GI endoscopy Indications:              Suspected upper gastrointestinal bleeding,                            Gastrointestinal bleeding of unknown origin Providers:                Mauri Pole, MD, Burtis Junes, RN, Lance Coon, CRNA Referring MD:              Medicines:                Monitored Anesthesia Care Complications:            No immediate complications. Estimated Blood Loss:     Estimated blood loss: none. Procedure:                Pre-Anesthesia Assessment:                           - Prior to the procedure, a History and Physical                            was performed, and patient medications and                            allergies were reviewed. The patient's tolerance of                            previous anesthesia was also reviewed. The risks                            and benefits of the procedure and the sedation                            options and risks were discussed with the patient.                            All questions were answered, and informed consent                            was obtained. Prior Anticoagulants: The patient has                            taken no previous anticoagulant or antiplatelet                            agents. ASA Grade Assessment: III - A patient with  severe systemic disease. After reviewing the risks                            and benefits, the patient was deemed in                            satisfactory condition to undergo the procedure.                           After obtaining informed consent, the endoscope was                            passed under direct vision. Throughout the                            procedure,  the patient's blood pressure, pulse, and                            oxygen saturations were monitored continuously. The                            [id number] ([id number]) scope was introduced through the                            mouth, and advanced to the third part of duodenum.                            The upper GI endoscopy was accomplished without                            difficulty. The patient tolerated the procedure                            well. Scope In: Scope Out: Findings:      The esophagus was normal. Regular Z-line at 35cm from incissors      The stomach was normal. No evidence of gastritis or gastric ulcer      The examined duodenum was normal. No evidence of AVM in proximal small       bowel Impression:               - Normal esophagus.                           - Normal stomach.                           - Normal examined duodenum.                           - No specimens collected. Moderate Sedation:      N/A Recommendation:           - Patient has a contact number available for                            emergencies. The signs and symptoms of  potential                            delayed complications were discussed with the                            patient. Return to normal activities tomorrow.                            Written discharge instructions were provided to the                            patient.                           - Clear liquid diet today.                           - Continue present medications.                           - No repeat upper endoscopy.                           -Plan for colonoscopy tomorrow for further                            evaluation of anemia with fecal heme occult positive Procedure Code(s):        --- Professional ---                           [phone], Esophagogastroduodenoscopy, flexible,                            transoral; diagnostic, including collection of                            specimen(s) by brushing or  washing, when performed                            (separate procedure) Diagnosis Code(s):        --- Professional ---                           K92.2, Gastrointestinal hemorrhage, unspecified CPT copyright 2016 [employer] Medical Association. All rights reserved. The codes documented in this report are preliminary and upon coder review may  be revised to meet current compliance requirements. Mauri Pole, MD 09/21/2016 1:32:26 PM This report has been signed electronically. Number of Addenda: 0

## 2016-09-21 NOTE — Interval H&P Note (Signed)
History and Physical Interval Note:  09/21/2016 1:01 PM  [name]  has presented today for surgery, with the diagnosis of GI bleed  The various methods of treatment have been discussed with the patient and family. After consideration of risks, benefits and other options for treatment, the patient has consented to  Procedure(s): ESOPHAGOGASTRODUODENOSCOPY (EGD) (N/A) as a surgical intervention .  The patient's history has been reviewed, patient examined, no change in status, stable for surgery.  I have reviewed the patient's chart and labs.  Questions were answered to the patient's satisfaction.     Kavitha Nandigam

## 2016-09-21 NOTE — Progress Notes (Signed)
Daily Rounding Note  09/21/2016, 11:03 AM  LOS: 3 days   SUBJECTIVE:   Chief complaint:  Some nausea this AM but no emesis.  Stool this AM, not clear if it was still black or bloody.  Nausea has been an issue and Dr Odie Sera was planning EGD once other medical issues sorted out.  Was to see have an EEG today as part of a seizure work up, this now cancelled.      OBJECTIVE:         Vital signs in last 24 hours:    Temp:  [98.1 F (36.7 C)-98.5 F (36.9 C)] 98.1 F (36.7 C) (04/02 0806) Pulse Rate:  [58-78] 62 (04/02 0900) Resp:  [11-21] 15 (04/02 0900) BP: (132-184)/(44-127) 132/54 (04/02 0806) SpO2:  [95 %-100 %] 97 % (04/02 0900) Weight:  [57.5 kg (126 lb 12.2 oz)] 57.5 kg (126 lb 12.2 oz) (04/02 0322) Last BM Date: 09/20/16 Filed Weights   09/18/16 2315 09/21/16 0322  Weight: 57.4 kg (126 lb 8.7 oz) 57.5 kg (126 lb 12.2 oz)   General: pleasant, pale and somewhat chronically ill looking Echocardiogram in progress so no hands on exam performed.    Heart: RRR Chest: no labored breathing.  Neuro/Psych:  Pleasant, cooperative.  Calm.    Intake/Output from previous day: 04/01 0701 - 04/02 0700 In: 311.3 [I.V.:311.3] Out: 200 [Urine:200]  Intake/Output this shift: No intake/output data recorded.  Lab Results:  Recent Labs  09/20/16 0233 09/20/16 1229 09/21/16 0232  WBC 4.8 4.6 4.4  HGB 7.6* 8.0* 7.4*  HCT 25.3* 26.6* 24.8*  PLT 127* 129* 132*   BMET  Recent Labs  09/20/16 0233 09/21/16 0232  NA 136 138  K 3.8 3.8  CL 99* 102  CO2 28 28  GLUCOSE 119* 90  BUN 13 11  CREATININE 1.32* 1.14*  CALCIUM 8.6* 8.5*   LFT No results for input(s): PROT, ALBUMIN, AST, ALT, ALKPHOS, BILITOT, BILIDIR, IBILI in the last 72 hours. PT/INR  Recent Labs  09/20/16 1229  LABPROT 14.6  INR 1.14   Hepatitis Panel No results for input(s): HEPBSAG, HCVAB, HEPAIGM, HEPBIGM in the last 72  hours.  Studies/Results: Nm Myocar Multi W/spect W/wall Motion / Ef  Result Date: 09/20/2016 CLINICAL DATA:  Of chest pain.  Previous CABG.  Shortness of breath. EXAM: MYOCARDIAL IMAGING WITH SPECT (REST AND PHARMACOLOGIC-STRESS) GATED LEFT VENTRICULAR WALL MOTION STUDY LEFT VENTRICULAR EJECTION FRACTION TECHNIQUE: Standard myocardial SPECT imaging was performed after resting intravenous injection of 10 mCi Tc-79m sestamibi. Subsequently, intravenous infusion of Lexiscan was performed under the supervision of the Cardiology staff. At peak effect of the drug, 30 mCi Tc-51m sestamibi was injected intravenously and standard myocardial SPECT imaging was performed. Quantitative gated imaging was also performed to evaluate left ventricular wall motion, and estimate left ventricular ejection fraction. COMPARISON:  None. FINDINGS: Perfusion: No decreased activity in the left ventricle on stress imaging to suggest reversible ischemia or infarction. Wall Motion: Septal hypokinesis as before. Otherwise Normal left ventricular wall motion. No left ventricular dilation. Left Ventricular Ejection Fraction: 52 % End diastolic volume 87 ml End systolic volume 42 ml IMPRESSION: 1. No reversible ischemia or infarction. 2. Chronic septal hypokinesis, otherwise normal left ventricular wall motion. 3. Left ventricular ejection fraction 52% 4. Low-risk stress test findings*. *2012 Appropriate Use Criteria for Coronary Revascularization Focused Update: J Am Coll Cardiol. 0109;32(3):557-322. http://content.airportbarriers.com.aspx?articleid=1201161 Electronically Signed   By: Lucrezia Europe M.D.   On: 09/20/2016  16:05    ASSESMENT:   *  GIB, suspect UGI source.  Black stools.    *  ABL anemia.  Hgb 7.6 .. 8.0.[name]. 7.4.   No transfusions to date.   *   Afib on Eliquis.  s/p pacemaker.  Hx CAD, CABG 2003, s/p stenting.   Non-ischemic nuclear stress study with EF 52% on 10/20/2016.  Dr Harrington Challenger clears her for EGD.     *   Thrombocytopenia.  Dates back to 2015.      PLAN   *  EGD this afternoon.   Next CBC set for tomorrow AM.     Attending physician's note   I have taken an interval history, reviewed the chart and examined the patient. I agree with the Advanced Practitioner's note, impression and recommendations.  Plan for EGD for further evaluation of possible upper GI bleed Eliquis on hold for past 2 days. Hgb stable around 7.5-8 since admission.    Damaris Hippo, MD [phone] Mon-Fri 8a-5p [phone] after 5p, weekends, holidays   Azucena Freed  09/21/2016, 11:03 AM Pager: [phone]

## 2016-09-21 NOTE — Progress Notes (Addendum)
Ivory Broad PA paged for patient to determine if diet can be initiated, waiting on call back. Patient also coughing and has requested something to soothe cough.

## 2016-09-21 NOTE — Anesthesia Procedure Notes (Signed)
Procedure Name: MAC Date/Time: 09/21/2016 1:16 PM Performed by: Lance Coon Pre-anesthesia Checklist: Patient identified, Emergency Drugs available, Suction available, Patient being monitored and Timeout performed Patient Re-evaluated:Patient Re-evaluated prior to inductionOxygen Delivery Method: Nasal cannula

## 2016-09-21 NOTE — Anesthesia Preprocedure Evaluation (Signed)
Anesthesia Evaluation  Patient identified by MRN, date of birth, ID band Patient awake    Reviewed: Allergy & Precautions, NPO status , Patient's Chart, lab work & pertinent test results  Airway Mallampati: I  TM Distance: >3 FB Neck ROM: Full    Dental   Pulmonary shortness of breath, sleep apnea ,    Pulmonary exam normal        Cardiovascular hypertension, + CAD and + CABG  Normal cardiovascular exam+ pacemaker      Neuro/Psych Depression    GI/Hepatic GERD  Medicated and Controlled,  Endo/Other    Renal/GU      Musculoskeletal   Abdominal   Peds  Hematology   Anesthesia Other Findings   Reproductive/Obstetrics                             Anesthesia Physical Anesthesia Plan  ASA: III  Anesthesia Plan: MAC   Post-op Pain Management:    Induction: Intravenous  Airway Management Planned: Simple Face Mask  Additional Equipment:   Intra-op Plan:   Post-operative Plan:   Informed Consent: I have reviewed the patients History and Physical, chart, labs and discussed the procedure including the risks, benefits and alternatives for the proposed anesthesia with the patient or authorized representative who has indicated his/her understanding and acceptance.     Plan Discussed with: CRNA and Surgeon  Anesthesia Plan Comments:         Anesthesia Quick Evaluation

## 2016-09-21 NOTE — Anesthesia Postprocedure Evaluation (Signed)
Anesthesia Post Note  Patient: [name]  Procedure(s) Performed: Procedure(s) (LRB): ESOPHAGOGASTRODUODENOSCOPY (EGD) (N/A)  Patient location during evaluation: PACU Anesthesia Type: MAC Level of consciousness: awake and alert Pain management: pain level controlled Vital Signs Assessment: post-procedure vital signs reviewed and stable Respiratory status: spontaneous breathing, nonlabored ventilation, respiratory function stable and patient connected to nasal cannula oxygen Cardiovascular status: stable and blood pressure returned to baseline Anesthetic complications: no       Last Vitals:  Vitals:   09/21/16 1600 09/21/16 1635  BP:  (!) 146/51  Pulse: 63 (!) 59  Resp: 18 19  Temp:  36.6 C    Last Pain:  Vitals:   09/21/16 1635  TempSrc: Oral  PainSc:                  Danell Verno DAVID

## 2016-09-21 NOTE — Progress Notes (Signed)
Progress Note  Patient Name: [name] Date of Encounter: 09/21/2016  Primary Cardiologist: Claiborne Billings    Subjective   No CP No SOB    Inpatient Medications    Scheduled Meds: . amLODipine  5 mg Oral Daily  . busPIRone  10 mg Oral BID  . donepezil  10 mg Oral QHS  . furosemide  20 mg Oral Daily  . nebivolol  5 mg Oral Daily  . pantoprazole (PROTONIX) IV  40 mg Intravenous Q12H  . PARoxetine  30 mg Oral QPM   Continuous Infusions:  PRN Meds: acetaminophen, alum & mag hydroxide-simeth, ondansetron (ZOFRAN) IV   Vital Signs    Vitals:   09/21/16 0700 09/21/16 0800 09/21/16 0806 09/21/16 0900  BP:   (!) 132/54   Pulse: (!) 59 64 64 62  Resp: 19 16 14 15   Temp:   98.1 F (36.7 C)   TempSrc:   Oral   SpO2: 96% 98% 100% 97%  Weight:      Height:        Intake/Output Summary (Last 24 hours) at 09/21/16 1024 Last data filed at 09/21/16 7169  Gross per 24 hour  Intake           311.25 ml  Output              200 ml  Net           111.25 ml   Filed Weights   09/18/16 2315 09/21/16 0322  Weight: 126 lb 8.7 oz (57.4 kg) 126 lb 12.2 oz (57.5 kg)    Telemetry    SR   - Personally Reviewed  ECG     Physical Exam   GEN: No acute distress.   Neck: No JVD Cardiac: RRR, no murmurs, rubs, or gallops.  Respiratory: Clear to auscultation bilaterally. GI: Soft, nontender, non-distended  MS: No edema; No deformity. Neuro:  Nonfocal  Psych: Normal affect   Labs    Chemistry Recent Labs Lab 09/18/16 0030 09/20/16 0233 09/21/16 0232  NA 138 136 138  K 3.6 3.8 3.8  CL 102 99* 102  CO2 28 28 28   GLUCOSE 105* 119* 90  BUN 13 13 11   CREATININE 1.32* 1.32* 1.14*  CALCIUM 8.6* 8.6* 8.5*  PROT 5.5*  --   --   ALBUMIN 3.4*  --   --   AST 17  --   --   ALT 11*  --   --   ALKPHOS 66  --   --   BILITOT 0.9  --   --   GFRNONAA 36* 36* 43*  GFRAA 42* 42* 50*  ANIONGAP 8 9 8      Hematology Recent Labs Lab 09/20/16 0233 09/20/16 1229 09/21/16 0232  WBC  4.8 4.6 4.4  RBC 3.12* 3.26* 3.07*  HGB 7.6* 8.0* 7.4*  HCT 25.3* 26.6* 24.8*  MCV 81.1 81.6 80.8  MCH 24.4* 24.5* 24.1*  MCHC 30.0 30.1 29.8*  RDW 17.0* 17.1* 16.9*  PLT 127* 129* 132*    Cardiac Enzymes Recent Labs Lab 09/18/16 0030 09/19/16 0736  TROPONINI 0.03* <0.03   No results for input(s): TROPIPOC in the last 168 hours.   BNP Recent Labs Lab 09/18/16 0030  BNP 324.2*     DDimer No results for input(s): DDIMER in the last 168 hours.   Radiology    Nm Myocar Multi W/spect W/wall Motion / Ef  Result Date: 09/20/2016 CLINICAL DATA:  Of chest pain.  Previous CABG.  Shortness of breath. EXAM: MYOCARDIAL IMAGING WITH SPECT (REST AND PHARMACOLOGIC-STRESS) GATED LEFT VENTRICULAR WALL MOTION STUDY LEFT VENTRICULAR EJECTION FRACTION TECHNIQUE: Standard myocardial SPECT imaging was performed after resting intravenous injection of 10 mCi Tc-54m sestamibi. Subsequently, intravenous infusion of Lexiscan was performed under the supervision of the Cardiology staff. At peak effect of the drug, 30 mCi Tc-75m sestamibi was injected intravenously and standard myocardial SPECT imaging was performed. Quantitative gated imaging was also performed to evaluate left ventricular wall motion, and estimate left ventricular ejection fraction. COMPARISON:  None. FINDINGS: Perfusion: No decreased activity in the left ventricle on stress imaging to suggest reversible ischemia or infarction. Wall Motion: Septal hypokinesis as before. Otherwise Normal left ventricular wall motion. No left ventricular dilation. Left Ventricular Ejection Fraction: 52 % End diastolic volume 87 ml End systolic volume 42 ml IMPRESSION: 1. No reversible ischemia or infarction. 2. Chronic septal hypokinesis, otherwise normal left ventricular wall motion. 3. Left ventricular ejection fraction 52% 4. Low-risk stress test findings*. *2012 Appropriate Use Criteria for Coronary Revascularization Focused Update: J Am Coll Cardiol.  4239;53(2):023-343. http://content.airportbarriers.com.aspx?articleid=[email or web address] Electronically Signed   By: Lucrezia Europe M.D.   On: 09/20/2016 16:05    Cardiac Studies   As above    Patient Profile     83 y.o. female with CAD s/p CABG 2003 (LIMA to LAD, SVG to ramus and mitral valve repair with ring), previous PCI of her native vessels, stable cath 2015, OSA, HTN, HLD, CKD III, TIA,anemia, chronic systolic and diastolic heart failure, ischemic EF 25-35% s/p Bi-V ICD 2014 with EF recovery to 55% in 2015, dementia, atrial fibrillation on apixaban, seizure last month pending EEG who was admitted with chest pain, accelerated HTN and hemoglobin in the 7 range (previously 11).   Assessment & Plan    1  GI  Plan for EDG today    2  CAD  Nuclear scan with no ischemia  Continue medical Rx    3  [place] systolic CHF  BiV ICD   LVEF 55 in 2015  Nuc LVEF 52 %  4    Anemia    Hgb 7.4 today  EGD today  Hold anticoag    5   HTN  BP is labile  Follow fo now     Signed, Dorris Carnes, MD  09/21/2016, 10:24 AM

## 2016-09-21 NOTE — H&P (View-Only) (Signed)
Daily Rounding Note  09/21/2016, 11:03 AM  LOS: 3 days   SUBJECTIVE:   Chief complaint:  Some nausea this AM but no emesis.  Stool this AM, not clear if it was still black or bloody.  Nausea has been an issue and Dr Odie Sera was planning EGD once other medical issues sorted out.  Was to see have an EEG today as part of a seizure work up, this now cancelled.      OBJECTIVE:         Vital signs in last 24 hours:    Temp:  [98.1 F (36.7 C)-98.5 F (36.9 C)] 98.1 F (36.7 C) (04/02 0806) Pulse Rate:  [58-78] 62 (04/02 0900) Resp:  [11-21] 15 (04/02 0900) BP: (132-184)/(44-127) 132/54 (04/02 0806) SpO2:  [95 %-100 %] 97 % (04/02 0900) Weight:  [57.5 kg (126 lb 12.2 oz)] 57.5 kg (126 lb 12.2 oz) (04/02 0322) Last BM Date: 09/20/16 Filed Weights   09/18/16 2315 09/21/16 0322  Weight: 57.4 kg (126 lb 8.7 oz) 57.5 kg (126 lb 12.2 oz)   General: pleasant, pale and somewhat chronically ill looking Echocardiogram in progress so no hands on exam performed.    Heart: RRR Chest: no labored breathing.  Neuro/Psych:  Pleasant, cooperative.  Calm.    Intake/Output from previous day: 04/01 0701 - 04/02 0700 In: 311.3 [I.V.:311.3] Out: 200 [Urine:200]  Intake/Output this shift: No intake/output data recorded.  Lab Results:  Recent Labs  09/20/16 0233 09/20/16 1229 09/21/16 0232  WBC 4.8 4.6 4.4  HGB 7.6* 8.0* 7.4*  HCT 25.3* 26.6* 24.8*  PLT 127* 129* 132*   BMET  Recent Labs  09/20/16 0233 09/21/16 0232  NA 136 138  K 3.8 3.8  CL 99* 102  CO2 28 28  GLUCOSE 119* 90  BUN 13 11  CREATININE 1.32* 1.14*  CALCIUM 8.6* 8.5*   LFT No results for input(s): PROT, ALBUMIN, AST, ALT, ALKPHOS, BILITOT, BILIDIR, IBILI in the last 72 hours. PT/INR  Recent Labs  09/20/16 1229  LABPROT 14.6  INR 1.14   Hepatitis Panel No results for input(s): HEPBSAG, HCVAB, HEPAIGM, HEPBIGM in the last 72  hours.  Studies/Results: Nm Myocar Multi W/spect W/wall Motion / Ef  Result Date: 09/20/2016 CLINICAL DATA:  Of chest pain.  Previous CABG.  Shortness of breath. EXAM: MYOCARDIAL IMAGING WITH SPECT (REST AND PHARMACOLOGIC-STRESS) GATED LEFT VENTRICULAR WALL MOTION STUDY LEFT VENTRICULAR EJECTION FRACTION TECHNIQUE: Standard myocardial SPECT imaging was performed after resting intravenous injection of 10 mCi Tc-31m sestamibi. Subsequently, intravenous infusion of Lexiscan was performed under the supervision of the Cardiology staff. At peak effect of the drug, 30 mCi Tc-9m sestamibi was injected intravenously and standard myocardial SPECT imaging was performed. Quantitative gated imaging was also performed to evaluate left ventricular wall motion, and estimate left ventricular ejection fraction. COMPARISON:  None. FINDINGS: Perfusion: No decreased activity in the left ventricle on stress imaging to suggest reversible ischemia or infarction. Wall Motion: Septal hypokinesis as before. Otherwise Normal left ventricular wall motion. No left ventricular dilation. Left Ventricular Ejection Fraction: 52 % End diastolic volume 87 ml End systolic volume 42 ml IMPRESSION: 1. No reversible ischemia or infarction. 2. Chronic septal hypokinesis, otherwise normal left ventricular wall motion. 3. Left ventricular ejection fraction 52% 4. Low-risk stress test findings*. *2012 Appropriate Use Criteria for Coronary Revascularization Focused Update: J Am Coll Cardiol. 5784;69(6):295-284. http://content.airportbarriers.com.aspx?articleid=[email or web address] Electronically Signed   By: Lucrezia Europe M.D.   On: 09/20/2016  16:05    ASSESMENT:   *  GIB, suspect UGI source.  Black stools.    *  ABL anemia.  Hgb 7.6 .. 8.0.[name]. 7.4.   No transfusions to date.   *   Afib on Eliquis.  s/p pacemaker.  Hx CAD, CABG 2003, s/p stenting.   Non-ischemic nuclear stress study with EF 52% on 10/20/2016.  Dr Harrington Challenger clears her for EGD.     *   Thrombocytopenia.  Dates back to 2015.      PLAN   *  EGD this afternoon.   Next CBC set for tomorrow AM.     Attending physician's note   I have taken an interval history, reviewed the chart and examined the patient. I agree with the Advanced Practitioner's note, impression and recommendations.  Plan for EGD for further evaluation of possible upper GI bleed Eliquis on hold for past 2 days. Hgb stable around 7.5-8 since admission.    Damaris Hippo, MD [phone] Mon-Fri 8a-5p [phone] after 5p, weekends, holidays   Azucena Freed  09/21/2016, 11:03 AM Pager: [phone]

## 2016-09-22 ENCOUNTER — Encounter (HOSPITAL_COMMUNITY)
Admission: AD | Disposition: A | Discharge status: Home Health | Payer: Self-pay | Source: Other Acute Inpatient Hospital | Attending: Cardiology

## 2016-09-22 ENCOUNTER — Inpatient Hospital Stay (HOSPITAL_COMMUNITY): Payer: Medicare Other | Admitting: Certified Registered Nurse Anesthetist

## 2016-09-22 ENCOUNTER — Encounter (HOSPITAL_COMMUNITY): Payer: Self-pay | Admitting: *Deleted

## 2016-09-22 DIAGNOSIS — I5020 Unspecified systolic (congestive) heart failure: Secondary | ICD-10-CM

## 2016-09-22 HISTORY — PX: COLONOSCOPY WITH PROPOFOL: SHX5780

## 2016-09-22 LAB — CBC
HCT: 26.3 % — ABNORMAL LOW (ref 36.0–46.0)
Hemoglobin: 7.9 g/dL — ABNORMAL LOW (ref 12.0–15.0)
MCH: 24.3 pg — ABNORMAL LOW (ref 26.0–34.0)
MCHC: 30 g/dL (ref 30.0–36.0)
MCV: 80.9 fL (ref 78.0–100.0)
PLATELETS: 165 10*3/uL (ref 150–400)
RBC: 3.25 MIL/uL — ABNORMAL LOW (ref 3.87–5.11)
RDW: 16.5 % — AB (ref 11.5–15.5)
WBC: 4.1 10*3/uL (ref 4.0–10.5)

## 2016-09-22 LAB — BASIC METABOLIC PANEL
Anion gap: 10 (ref 5–15)
BUN: 8 mg/dL (ref 6–20)
CALCIUM: 9 mg/dL (ref 8.9–10.3)
CHLORIDE: 104 mmol/L (ref 101–111)
CO2: 25 mmol/L (ref 22–32)
CREATININE: 1.18 mg/dL — AB (ref 0.44–1.00)
GFR calc Af Amer: 48 mL/min — ABNORMAL LOW (ref >60)
GFR, EST NON AFRICAN AMERICAN: 41 mL/min — AB (ref >60)
Glucose, Bld: 107 mg/dL — ABNORMAL HIGH (ref 65–99)
Potassium: 3.4 mmol/L — ABNORMAL LOW (ref 3.5–5.1)
SODIUM: 139 mmol/L (ref 135–145)

## 2016-09-22 SURGERY — COLONOSCOPY WITH PROPOFOL
Anesthesia: Monitor Anesthesia Care

## 2016-09-22 MED ORDER — LIDOCAINE HCL (CARDIAC) 20 MG/ML IV SOLN
INTRAVENOUS | Status: DC | PRN
  Administered 2016-09-22: 60 mg via INTRATRACHEAL

## 2016-09-22 MED ORDER — SODIUM CHLORIDE 0.9 % IV SOLN
INTRAVENOUS | Status: DC | PRN
  Administered 2016-09-22: 10:00:00 via INTRAVENOUS

## 2016-09-22 MED ORDER — FERROUS SULFATE 325 (65 FE) MG PO TABS
325.0000 mg | ORAL_TABLET | Freq: Three times a day (TID) | ORAL | Status: DC
  Administered 2016-09-22 – 2016-09-23 (×3): 325 mg via ORAL
  Filled 2016-09-22 (×3): qty 1

## 2016-09-22 MED ORDER — PROPOFOL 10 MG/ML IV BOLUS
INTRAVENOUS | Status: DC | PRN
  Administered 2016-09-22: 30 mg via INTRAVENOUS
  Administered 2016-09-22: 15 mg via INTRAVENOUS

## 2016-09-22 MED ORDER — PROPOFOL 500 MG/50ML IV EMUL
INTRAVENOUS | Status: DC | PRN
  Administered 2016-09-22: 75 ug/kg/min via INTRAVENOUS
  Administered 2016-09-22: 11:00:00 via INTRAVENOUS

## 2016-09-22 NOTE — Transfer of Care (Signed)
Immediate Anesthesia Transfer of Care Note  Patient: [name]  Procedure(s) Performed: Procedure(s): COLONOSCOPY WITH PROPOFOL (N/A)  Patient Location: PACU  Anesthesia Type:MAC  Level of Consciousness: lethargic and responds to stimulation, resting comfortably  Airway & Oxygen Therapy: Patient Spontanous Breathing and Patient connected to face mask oxygen  Post-op Assessment: Report given to RN and Post -op Vital signs reviewed and stable  Post vital signs: Reviewed and stable  Last Vitals:  Vitals:   09/22/16 0400 09/22/16 0834  BP: (!) 155/84 (!) 168/97  Pulse: 67 64  Resp: 18 20  Temp:  37 C    Last Pain:  Vitals:   09/22/16 0834  TempSrc: Oral  PainSc:          Complications: No apparent anesthesia complications

## 2016-09-22 NOTE — Op Note (Addendum)
[facility] Patient Name: [name] Procedure Date : 09/22/2016 MRN: [id number] Attending MD: Mauri Pole , MD Date of Birth: 1930-11-29 CSN: [id number] Age: 83 Admit Type: Inpatient Procedure:                Colonoscopy Indications:              Evaluation of unexplained GI bleeding Providers:                Mauri Pole, MD, Dortha Schwalbe RN, RN,                            Cherylynn Ridges, Technician, Lavona Mound, CRNA Referring MD:              Medicines:                Monitored Anesthesia Care Complications:            No immediate complications. Estimated Blood Loss:     Estimated blood loss was minimal. Procedure:                Pre-Anesthesia Assessment:                           - Prior to the procedure, a History and Physical                            was performed, and patient medications and                            allergies were reviewed. The patient's tolerance of                            previous anesthesia was also reviewed. The risks                            and benefits of the procedure and the sedation                            options and risks were discussed with the patient.                            All questions were answered, and informed consent                            was obtained. Prior Anticoagulants: The patient                            last took Eliquis (apixaban) 3 days prior to the                            procedure. ASA Grade Assessment: IV - A patient                            with severe systemic disease that is a constant  threat to life. After reviewing the risks and                            benefits, the patient was deemed in satisfactory                            condition to undergo the procedure.                           After obtaining informed consent, the colonoscope                            was passed under direct vision. Throughout the                             procedure, the patient's blood pressure, pulse, and                            oxygen saturations were monitored continuously. The                            EC-[id number] ([id number]) scope was introduced through                            the anus and advanced to the the terminal ileum,                            with identification of the appendiceal orifice and                            IC valve. The colonoscopy was technically difficult                            and complex due to multiple diverticula in the                            colon, restricted mobility of the colon,                            significant looping and a tortuous colon.                            Successful completion of the procedure was aided by                            withdrawing the scope and replacing with the adult                            endoscope. The patient tolerated the procedure                            fairly well. The quality of the bowel preparation  was adequate. The terminal ileum, ileocecal valve,                            appendiceal orifice, and rectum were photographed. Scope In: 10:50:59 AM Scope Out: 11:28:52 AM Scope Withdrawal Time: 0 hours 8 minutes 57 seconds  Total Procedure Duration: 0 hours 37 minutes 53 seconds  Findings:      The digital rectal exam findings include decreased sphincter tone.      A patchy area of moderately erythematous, friable mucosa was found in       the ascending colon.      A 20 mm polypoid lesion was found at the ileocecal valve. The lesion was       multi-lobulated. Friable and oozing was present. Biopsies were taken       with a cold forceps for histology.      Scattered small and large-mouthed diverticula were found in the sigmoid       colon and descending colon. There was narrowing of the colon in       association with the diverticular opening. There was evidence of       diverticular spasm.      Internal  hemorrhoids were found during retroflexion. The hemorrhoids       were small. Impression:               - Erythematous mucosa in the ascending colon                            possible ischemic colitis.                           - Rule out malignancy, polypoid lesion at the                            ileocecal valve. Biopsied.                           - Severe diverticulosis in the sigmoid colon and in                            the descending colon. There was narrowing of the                            colon in association with the diverticular opening.                            There was evidence of diverticular spasm.                           - Internal hemorrhoids. Moderate Sedation:      N/A Recommendation:           - Patient has a contact number available for                            emergencies. The signs and symptoms of potential  delayed complications were discussed with the                            patient. Return to normal activities tomorrow.                            Written discharge instructions were provided to the                            patient.                           - Resume previous diet.                           - Continue present medications.                           - Await pathology results.                           - No repeat colonoscopy due to age.                           -Oral iron (ferrous sulphate 325 mg TID with meals) Procedure Code(s):        --- Professional ---                           [phone], Colonoscopy, flexible; with biopsy, single                            or multiple Diagnosis Code(s):        --- Professional ---                           K63.89, Other specified diseases of intestine                           D49.0, Neoplasm of unspecified behavior of                            digestive system                           K64.8, Other hemorrhoids                           K92.2, Gastrointestinal  hemorrhage, unspecified                           K57.30, Diverticulosis of large intestine without                            perforation or abscess without bleeding CPT copyright 2016 American Medical Association. All rights reserved. The codes documented in this report are preliminary and upon coder review may  be revised to meet current compliance requirements. Mauri Pole, MD 09/22/2016 11:40:57 AM This report  has been signed electronically. Number of Addenda: 0

## 2016-09-22 NOTE — Interval H&P Note (Signed)
History and Physical Interval Note:  09/22/2016 10:03 AM  [name]  has presented today for surgery, with the diagnosis of Evaluate for source of bleeding. Patient having black, FOBT+ stool  The various methods of treatment have been discussed with the patient and family. After consideration of risks, benefits and other options for treatment, the patient has consented to  Procedure(s): COLONOSCOPY WITH PROPOFOL (N/A) as a surgical intervention .  The patient's history has been reviewed, patient examined, no change in status, stable for surgery.  I have reviewed the patient's chart and labs.  Questions were answered to the patient's satisfaction.     [name]

## 2016-09-22 NOTE — Anesthesia Procedure Notes (Signed)
Procedure Name: MAC Date/Time: 09/22/2016 10:18 AM Performed by: Salli Quarry Enez Monahan Pre-anesthesia Checklist: Patient identified, Emergency Drugs available, Suction available and Patient being monitored Patient Re-evaluated:Patient Re-evaluated prior to inductionOxygen Delivery Method: Simple face mask

## 2016-09-22 NOTE — Progress Notes (Signed)
Was able to obtain two nurse consent from husband for colonoscopy.

## 2016-09-22 NOTE — Progress Notes (Signed)
Pt extremely agitated and unable to be redirected. Insisting on getting out of bed to go home as she feels she is at at party and it is over. Cardiology notified

## 2016-09-22 NOTE — Progress Notes (Addendum)
Pt completed drinking bowel prep. Flexiseal inserted

## 2016-09-22 NOTE — Progress Notes (Signed)
Progress Note  Patient Name: [name] Date of Encounter: 09/22/2016  Primary Cardiologist: Claiborne Billings    Subjective   Breathing is OK  No CP    Inpatient Medications    Scheduled Meds: . amLODipine  5 mg Oral Daily  . busPIRone  10 mg Oral BID  . donepezil  10 mg Oral QHS  . furosemide  20 mg Oral Daily  . nebivolol  5 mg Oral Daily  . pantoprazole (PROTONIX) IV  40 mg Intravenous Q12H  . PARoxetine  30 mg Oral QPM   Continuous Infusions:  PRN Meds: acetaminophen, alum & mag hydroxide-simeth, ondansetron (ZOFRAN) IV   Vital Signs    Vitals:   09/22/16 0400 09/22/16 0834 09/22/16 1137 09/22/16 1145  BP: (!) 155/84 (!) 168/97 (!) 158/40 (!) 180/40  Pulse: 67 64    Resp: 18 20 16  (!) 27  Temp:  98.6 F (37 C) 97.8 F (36.6 C)   TempSrc:  Oral Axillary   SpO2: (!) 88% 96% 100% 94%  Weight:      Height:        Intake/Output Summary (Last 24 hours) at 09/22/16 1235 Last data filed at 09/22/16 1123  Gross per 24 hour  Intake              790 ml  Output             1903 ml  Net            -1113 ml   Filed Weights   09/18/16 2315 09/21/16 0322  Weight: 126 lb 8.7 oz (57.4 kg) 126 lb 12.2 oz (57.5 kg)    Telemetry    Paced  - Personally Reviewed  ECG      Physical Exam   GEN: No acute distress.   Neck: No JVD Cardiac: RRR, no murmurs, rubs, or gallops.  Respiratory: Clear to auscultation bilaterally. MS: No edema; No deformity. Neuro:  Nonfocal  Psych: Normal affect   Labs    Chemistry Recent Labs Lab 09/18/16 0030 09/20/16 0233 09/21/16 0232 09/22/16 0211  NA 138 136 138 139  K 3.6 3.8 3.8 3.4*  CL 102 99* 102 104  CO2 28 28 28 25   GLUCOSE 105* 119* 90 107*  BUN 13 13 11 8   CREATININE 1.32* 1.32* 1.14* 1.18*  CALCIUM 8.6* 8.6* 8.5* 9.0  PROT 5.5*  --   --   --   ALBUMIN 3.4*  --   --   --   AST 17  --   --   --   ALT 11*  --   --   --   ALKPHOS 66  --   --   --   BILITOT 0.9  --   --   --   GFRNONAA 36* 36* 43* 41*  GFRAA 42*  42* 50* 48*  ANIONGAP 8 9 8 10      Hematology Recent Labs Lab 09/20/16 1229 09/21/16 0232 09/22/16 0211  WBC 4.6 4.4 4.1  RBC 3.26* 3.07* 3.25*  HGB 8.0* 7.4* 7.9*  HCT 26.6* 24.8* 26.3*  MCV 81.6 80.8 80.9  MCH 24.5* 24.1* 24.3*  MCHC 30.1 29.8* 30.0  RDW 17.1* 16.9* 16.5*  PLT 129* 132* 165    Cardiac Enzymes Recent Labs Lab 09/18/16 0030 09/19/16 0736  TROPONINI 0.03* <0.03   No results for input(s): TROPIPOC in the last 168 hours.   BNP Recent Labs Lab 09/18/16 0030  BNP 324.2*     DDimer  No results for input(s): DDIMER in the last 168 hours.   Radiology    No results found.  Cardiac Studies     Patient Profile      83 y.o.femalewith CAD s/p CABG 2003 (LIMA to LAD, SVG to ramus and mitral valve repair with ring), previous PCI of her native vessels, stable cath 2015, OSA, HTN, HLD, CKD III,TIA,anemia, chronic systolic and diastolic heart failure, ischemic EF 25-35% s/p Bi-V ICD 2014 with EF recovery to 55% in 2015, dementia, atrial fibrillation on apixaban, seizure last month pending EEG who was admitted with chest pain, accelerated HTN and hemoglobin in the 7 range (previously 11).  Assessment & Plan    GI  Pt underwent colonoscopy today  This showed erythematous mucosa, possible ischemic colities  A polyp was biopsied.  Severe diverticulosis also seen   Recomm FeSO4 tid  CBC today was 7.9  2  CAD  Nuclear scan without ischemia  3  Hx systolic CHF  s/p BiV ICED   LVEF 55%  4  Anemia  Hgb 7.9  Supplement Fe  5  HTN  BP is high  Resuming meds    If OK  (alert) later could go home   Want to make sure pt stable for d/c      Signed, Dorris Carnes, MD  09/22/2016, 12:35 PM

## 2016-09-22 NOTE — Anesthesia Preprocedure Evaluation (Addendum)
Anesthesia Evaluation  Patient identified by MRN, date of birth, ID band Patient confused    Reviewed: Allergy & Precautions, NPO status , Patient's Chart, lab work & pertinent test results  Airway Mallampati: I  TM Distance: >3 FB Neck ROM: Full    Dental   Pulmonary shortness of breath, sleep apnea ,    Pulmonary exam normal        Cardiovascular hypertension, + CAD and + CABG  Normal cardiovascular exam+ pacemaker      Neuro/Psych Depression    GI/Hepatic GERD  Medicated and Controlled,  Endo/Other    Renal/GU      Musculoskeletal   Abdominal   Peds  Hematology  (+) anemia ,   Anesthesia Other Findings   Reproductive/Obstetrics                            Anesthesia Physical  Anesthesia Plan  ASA: III  Anesthesia Plan: MAC   Post-op Pain Management:    Induction: Intravenous  Airway Management Planned: Simple Face Mask  Additional Equipment:   Intra-op Plan:   Post-operative Plan:   Informed Consent: I have reviewed the patients History and Physical, chart, labs and discussed the procedure including the risks, benefits and alternatives for the proposed anesthesia with the patient or authorized representative who has indicated his/her understanding and acceptance.     Plan Discussed with: CRNA and Surgeon  Anesthesia Plan Comments:         Anesthesia Quick Evaluation

## 2016-09-22 NOTE — Progress Notes (Addendum)
Attempted to call pts husband Ambera Fedele on both home and cell number in regards to signing consent for colonoscopy. Unable to reach him and unable to leave voice message on either line. Will pass on to day RN  2nd attempt made to contact husband, unable to reach him

## 2016-09-22 NOTE — Progress Notes (Signed)
Pt very confused throughout night, also agitated. Although all questions have been answered repeatedly, pt forgets immediately and says "someone's playing a joke on me".  Constant reorientation but ineffective. Has been awake all night. Pulling off monitor leads, as well as pulling at IV lines and flexiseal.

## 2016-09-23 DIAGNOSIS — Z86010 Personal history of colonic polyps: Secondary | ICD-10-CM

## 2016-09-23 DIAGNOSIS — I509 Heart failure, unspecified: Secondary | ICD-10-CM

## 2016-09-23 LAB — BASIC METABOLIC PANEL
Anion gap: 7 (ref 5–15)
BUN: 9 mg/dL (ref 6–20)
CHLORIDE: 105 mmol/L (ref 101–111)
CO2: 26 mmol/L (ref 22–32)
Calcium: 8.7 mg/dL — ABNORMAL LOW (ref 8.9–10.3)
Creatinine, Ser: 1.18 mg/dL — ABNORMAL HIGH (ref 0.44–1.00)
GFR calc Af Amer: 48 mL/min — ABNORMAL LOW (ref >60)
GFR calc non Af Amer: 41 mL/min — ABNORMAL LOW (ref >60)
GLUCOSE: 120 mg/dL — AB (ref 65–99)
POTASSIUM: 3.6 mmol/L (ref 3.5–5.1)
SODIUM: 138 mmol/L (ref 135–145)

## 2016-09-23 LAB — CBC
HEMATOCRIT: 24 % — AB (ref 36.0–46.0)
HEMOGLOBIN: 7.3 g/dL — AB (ref 12.0–15.0)
MCH: 24.7 pg — ABNORMAL LOW (ref 26.0–34.0)
MCHC: 30.4 g/dL (ref 30.0–36.0)
MCV: 81.1 fL (ref 78.0–100.0)
Platelets: 160 10*3/uL (ref 150–400)
RBC: 2.96 MIL/uL — AB (ref 3.87–5.11)
RDW: 16.9 % — ABNORMAL HIGH (ref 11.5–15.5)
WBC: 3.5 10*3/uL — ABNORMAL LOW (ref 4.0–10.5)

## 2016-09-23 MED ORDER — AMLODIPINE BESYLATE 5 MG PO TABS
5.0000 mg | ORAL_TABLET | Freq: Every day | ORAL | Status: DC
  Administered 2016-09-23: 5 mg via ORAL
  Filled 2016-09-23: qty 1

## 2016-09-23 MED ORDER — IRBESARTAN 300 MG PO TABS: 300.0000 mg | ORAL_TABLET | Freq: Every day | ORAL | 5 refills | Status: DC

## 2016-09-23 MED ORDER — AMLODIPINE BESYLATE 5 MG PO TABS: 5.0000 mg | ORAL_TABLET | Freq: Two times a day (BID) | ORAL | Status: DC

## 2016-09-23 MED ORDER — AMLODIPINE BESYLATE 5 MG PO TABS: 5.0000 mg | ORAL_TABLET | Freq: Every day | ORAL | 5 refills | Status: DC

## 2016-09-23 MED ORDER — FERROUS SULFATE 325 (65 FE) MG PO TABS: 325.0000 mg | ORAL_TABLET | Freq: Three times a day (TID) | ORAL | 11 refills | Status: DC

## 2016-09-23 MED ORDER — IRBESARTAN 150 MG PO TABS
300.0000 mg | ORAL_TABLET | Freq: Every day | ORAL | Status: DC
  Administered 2016-09-23: 300 mg via ORAL
  Filled 2016-09-23: qty 2

## 2016-09-23 NOTE — Progress Notes (Addendum)
Progress Note  Patient Name: [name] Date of Encounter: 09/23/2016  Primary Cardiologist: Karene Fry      Subjective   Breathing is OK  No CP   Inpatient Medications    Scheduled Meds: . amLODipine  5 mg Oral Daily  . busPIRone  10 mg Oral BID  . donepezil  10 mg Oral QHS  . ferrous sulfate  325 mg Oral TID WC  . furosemide  20 mg Oral Daily  . nebivolol  5 mg Oral Daily  . pantoprazole (PROTONIX) IV  40 mg Intravenous Q12H  . PARoxetine  30 mg Oral QPM   Continuous Infusions:  PRN Meds: acetaminophen, alum & mag hydroxide-simeth, ondansetron (ZOFRAN) IV   Vital Signs    Vitals:   09/23/16 0002 09/23/16 0003 09/23/16 0443 09/23/16 0446  BP: (!) 182/63  (!) 203/63 (!) 199/61  Pulse: 73  77 69  Resp: 17  11 16   Temp:  98.5 F (36.9 C)  97.9 F (36.6 C)  TempSrc:  Oral  Oral  SpO2: 97%  (!) 89% 98%  Weight:      Height:        Intake/Output Summary (Last 24 hours) at 09/23/16 0803 Last data filed at 09/23/16 0442  Gross per 24 hour  Intake              800 ml  Output              600 ml  Net              200 ml   Filed Weights   09/18/16 2315 09/21/16 0322  Weight: 126 lb 8.7 oz (57.4 kg) 126 lb 12.2 oz (57.5 kg)    Telemetry    Paced  - Personally Reviewed  ECG      Physical Exam   GEN: No acute distress.   Neck: No JVD Cardiac: RRR, no murmurs, rubs, or gallops.  Respiratory: Clear to auscultation bilaterally. GI: Soft, nontender, non-distended  MS: No edema; No deformity. Neuro:  Nonfocal  Psych: Normal affect   Labs    Chemistry Recent Labs Lab 09/18/16 0030  09/21/16 0232 09/22/16 0211 09/23/16 0229  NA 138  < > 138 139 138  K 3.6  < > 3.8 3.4* 3.6  CL 102  < > 102 104 105  CO2 28  < > 28 25 26   GLUCOSE 105*  < > 90 107* 120*  BUN 13  < > 11 8 9   CREATININE 1.32*  < > 1.14* 1.18* 1.18*  CALCIUM 8.6*  < > 8.5* 9.0 8.7*  PROT 5.5*  --   --   --   --   ALBUMIN 3.4*  --   --   --   --   AST 17  --   --   --   --     ALT 11*  --   --   --   --   ALKPHOS 66  --   --   --   --   BILITOT 0.9  --   --   --   --   GFRNONAA 36*  < > 43* 41* 41*  GFRAA 42*  < > 50* 48* 48*  ANIONGAP 8  < > 8 10 7   < > = values in this interval not displayed.   Hematology Recent Labs Lab 09/21/16 0232 09/22/16 0211 09/23/16 0229  WBC 4.4 4.1 3.5*  RBC 3.07* 3.25* 2.96*  HGB 7.4* 7.9* 7.3*  HCT 24.8* 26.3* 24.0*  MCV 80.8 80.9 81.1  MCH 24.1* 24.3* 24.7*  MCHC 29.8* 30.0 30.4  RDW 16.9* 16.5* 16.9*  PLT 132* 165 160    Cardiac Enzymes Recent Labs Lab 09/18/16 0030 09/19/16 0736  TROPONINI 0.03* <0.03   No results for input(s): TROPIPOC in the last 168 hours.   BNP Recent Labs Lab 09/18/16 0030  BNP 324.2*     DDimer No results for input(s): DDIMER in the last 168 hours.   Radiology    No results found.  Cardiac Studies    Patient Profile     83 y.o. female Hx of CAD, OSA, HTN, CKD, TIA, anemia, systolic CHF, Afib (on Eliquis), seizure admitted with CP  Found to be anemic wit Hgb of 7    Assessment & Plan    1  GI  Colon with evid of colitis (? Ischemic),  Polyp (sessile, friable, 20 mm) biopsied  Severe diverticulosis EGD also done day prio0r  Normal  Plan for FeSO4  Will also need continued f/u  Keep on protonix  Hgb will need to be followed as outpt   Pt does have a GI doctor in [place] who will follow long term    2  CAD  Nuclear stress test done  No ischemia  3  Anemia  Hgb is 7.3  She is on Fe supplements  Will need to be followed closely    4  Chronic systolic CHF with BiV ICD  LVEF 55%  5  Atrial fib  Afib in setting of CHB with pacer  No Eliquis now  Will need to be reassessded as outpt  Hx of TIAs.  Hx falls (felt ortthostatic)  6  HTN  BP was severely elevated this AM  Will add back valsartan  She got lasix here She is on HCTZ at home    PT to see pt today  Plan for f/u in [place]   Signed, Dorris Carnes, MD  09/23/2016, 8:03 AM

## 2016-09-23 NOTE — Anesthesia Postprocedure Evaluation (Addendum)
Anesthesia Post Note  Patient: [name]  Procedure(s) Performed: Procedure(s) (LRB): COLONOSCOPY WITH PROPOFOL (N/A)  Patient location during evaluation: Endoscopy Anesthesia Type: MAC Level of consciousness: awake and alert Pain management: pain level controlled Vital Signs Assessment: post-procedure vital signs reviewed and stable Respiratory status: spontaneous breathing, nonlabored ventilation, respiratory function stable and patient connected to nasal cannula oxygen Cardiovascular status: blood pressure returned to baseline and stable Postop Assessment: no signs of nausea or vomiting Anesthetic complications: no       Last Vitals:  Vitals:   09/23/16 0446 09/23/16 0839  BP: (!) 199/61 138/88  Pulse: 69 85  Resp: 16   Temp: 36.6 C 36.8 C    Last Pain:  Vitals:   09/23/16 0839  TempSrc: Oral  PainSc:                  Luwanna Brossman,JAMES TERRILL

## 2016-09-23 NOTE — Progress Notes (Addendum)
Daily Rounding Note  09/23/2016, 10:09 AM  LOS: 5 days   SUBJECTIVE:      Pt without complaints  OBJECTIVE:         Vital signs in last 24 hours:    Temp:  [97.8 F (36.6 C)-98.8 F (37.1 C)] 98.2 F (36.8 C) (04/04 0839) Pulse Rate:  [58-85] 85 (04/04 0839) Resp:  [11-27] 16 (04/04 0446) BP: (134-203)/(39-95) 138/88 (04/04 0839) SpO2:  [89 %-100 %] 93 % (04/04 0839) Last BM Date: 09/22/16 Filed Weights   09/18/16 2315 09/21/16 0322  Weight: 57.4 kg (126 lb 8.7 oz) 57.5 kg (126 lb 12.2 oz)   General: NAD, looks well    Heart: RRR Chest: clear bil.  No cough or labored breathing Abdomen: soft, NT, ND.  No mass or HSM.    Extremities: no CCE Neuro/Psych:  Affect normal.  No overt confusion.   Intake/Output from previous day: 04/03 0701 - 04/04 0700 In: 800 [P.O.:400; I.V.:400] Out: 600 [Urine:600]  Intake/Output this shift: No intake/output data recorded.  Lab Results:  Recent Labs  09/21/16 0232 09/22/16 0211 09/23/16 0229  WBC 4.4 4.1 3.5*  HGB 7.4* 7.9* 7.3*  HCT 24.8* 26.3* 24.0*  PLT 132* 165 160   BMET  Recent Labs  09/21/16 0232 09/22/16 0211 09/23/16 0229  NA 138 139 138  K 3.8 3.4* 3.6  CL 102 104 105  CO2 28 25 26   GLUCOSE 90 107* 120*  BUN 11 8 9   CREATININE 1.14* 1.18* 1.18*  CALCIUM 8.5* 9.0 8.7*   LFT No results for input(s): PROT, ALBUMIN, AST, ALT, ALKPHOS, BILITOT, BILIDIR, IBILI in the last 72 hours. PT/INR  Recent Labs  09/20/16 1229  LABPROT 14.6  INR 1.14   Hepatitis Panel No results for input(s): HEPBSAG, HCVAB, HEPAIGM, HEPBIGM in the last 72 hours.  Studies/Results: No results found.  ASSESMENT:   *  Nausea, black/FOBT + stools. 09/21/16 EGD: Normal 09/22/16 Colonoscopy: ascending colitis, suspect ischemic (not biopsied).  20 mm, friable, oozing blood,  IC valve polyp, biopsied.  Severe, large and small left sided diverticulosis with some associated  luminal narrowing and spasm.  Internal hemorrhoids. .    *  Chronic Eliquis for a fib and CHF, hx PCI and CABG/mitral valve ring repair.    *  ABL anemia.  Baseline chronic anemia on oral iron supplements at home   PLAN   *  Await path report. Likely will result on 4/5.    *   Hold restart Eliquis until determination of what is needed regarding colon polyp: surgical resection if malignant, endoscopic mucosal resection if benign.  Pt's husband has endo/colon reports and will set pt up for GI follow up with GI MD [facility] in [place].      Azucena Freed  09/23/2016, 10:09 AM Pager: [phone]   Attending physician's note   I have taken an interval history, reviewed the chart and examined the patient. I agree with the Advanced Practitioner's note, impression and recommendations.  Patient has chronic mild anemia admitted with worsening symptomatic anemia. EGD was unremarkable. On colonoscopy noted congested friable mucosa suggestive of ischemic colitis and Polypoid lesion was biopsied likely adenoma. Patient has fluctuating BP with wide range likely predisposing to ischemic colitis. Follow up with GI as outpatient. Continue oral Iron TID. Monitor CBC, if no significant improvement in 2 weeks, may have to consider IV iron infusion.  Currently Eliquis on Hold given risk for bleeding.  Damaris Hippo, MD [phone] Mon-Fri 8a-5p [phone] after 5p, weekends, holidays

## 2016-09-23 NOTE — Care Management Note (Addendum)
Case Management Note  Patient Details  Name: [name] MRN: [id number] Date of Birth: [birth year]  Subjective/Objective:     Pt admitted with hypertensive urgency      Action/Plan:  [facility] agency comes into the home  12 hours each day to assist to help with ADLs.  Husband is retired and is also at home with pt.  Per husband - pt has had 21 falls in 9 months - previously went to SNF for rehab and discharged home.  Pt uses walker in the home.  CM ordered PT eval and informed of pending discharge home today to ensure safe discharge.  Pt has PCP and denied obtaining medications.  CM will continue to follow for discharge needs    Expected Discharge Date:  09/26/16               Expected Discharge Plan:  [place]  In-House Referral:  Clinical Social Work  Discharge planning Services  CM Consult  Post Acute Care Choice:    Choice offered to:     DME Arranged:    DME Agency:     HH Arranged:   PT HH Agency:   Home health services of [facility]  Status of Service:  Completed, will sign off If discussed at Long Length of Stay Meetings, dates discussed:    Additional Comments: Recommendation of HHPT.  Pt offered choice - agency choses was [facility] contacted and referral accepted; faxed orders and clinical info to [facility] at [phone] . Corinna Gab, RN, BSN [address], [place] 09/23/2016, 10:39 AM

## 2016-09-23 NOTE — Evaluation (Signed)
Physical Therapy Evaluation Patient Details Name: [name] MRN: [id number] DOB: 09/05/1930 Today's Date: 09/23/2016   History of Present Illness  Pt admit with HTN urgency.  Colonoscopy performed while here.  PMH:  CAD,CHF, afib.    Clinical Impression  Pt admitted with above diagnosis. Pt currently with functional limitations due to the deficits listed below (see PT Problem List). Pt was able to ambulate with RW in hallway with min assist.  Husband feels he can care for pt at home with his caregiver.  Will benefit from HHPT f/u.  Pt BP 181/56 after walk and nursing was made aware.  Other VSS. Pt will benefit from skilled PT to increase their independence and safety with mobility to allow discharge to the venue listed below.      Follow Up Recommendations Home health PT;Supervision/Assistance - 24 hour    Equipment Recommendations  None recommended by PT    Recommendations for Other Services       Precautions / Restrictions Precautions Precautions: Fall Restrictions Weight Bearing Restrictions: No      Mobility  Bed Mobility Overal bed mobility: Independent                Transfers Overall transfer level: Needs assistance Equipment used: Rolling walker (2 wheeled) Transfers: Sit to/from Stand Sit to Stand: Min guard         General transfer comment: cues for hand placement and steadying once up  Ambulation/Gait Ambulation/Gait assistance: Min assist Ambulation Distance (Feet): 140 Feet Assistive device: Rolling walker (2 wheeled) Gait Pattern/deviations: Step-through pattern;Decreased stride length;Drifts right/left   Gait velocity interpretation: Below normal speed for age/gender General Gait Details: Cues needed to stay close to RW and to sequence steps and RW. At times gets off balance and needed incr assist for steadying pt.   Stairs            Wheelchair Mobility    Modified Rankin (Stroke Patients Only)       Balance Overall balance  assessment: Needs assistance;History of Falls Sitting-balance support: No upper extremity supported;Feet supported Sitting balance-Leahy Scale: Fair     Standing balance support: Bilateral upper extremity supported;During functional activity Standing balance-Leahy Scale: Poor Standing balance comment: relies on RW for support.                             Pertinent Vitals/Pain Pain Assessment: No/denies pain    Home Living Family/patient expects to be discharged to:: Private residence Living Arrangements: Spouse/significant other Available Help at Discharge: Family;Available 24 hours/day;Personal care attendant ([facility]- 12 hour care, W 13 hour care, Sun 4 hour care) Type of Home: House Home Access: Level entry     Home Layout: One level Home Equipment: Walker - 2 wheels;Walker - 4 wheels;Bedside commode;Tub bench      Prior Function Level of Independence: Needs assistance   Gait / Transfers Assistance Needed: caregiver or husband walk with pt with RW  ADL's / Homemaking Assistance Needed: total assist by caregiver        Hand Dominance        Extremity/Trunk Assessment   Upper Extremity Assessment Upper Extremity Assessment: Defer to OT evaluation    Lower Extremity Assessment Lower Extremity Assessment: Generalized weakness    Cervical / Trunk Assessment Cervical / Trunk Assessment: Normal  Communication   Communication: No difficulties  Cognition Arousal/Alertness: Awake/alert Behavior During Therapy: Flat affect Overall Cognitive Status: Within Functional Limits for tasks assessed  General Comments      Exercises General Exercises - Lower Extremity Long Arc Quad: AROM;Both;15 reps;Seated   Assessment/Plan    PT Assessment Patient needs continued PT services  PT Problem List Decreased activity tolerance;Decreased strength;Decreased balance;Decreased mobility;Decreased knowledge of use  of DME;Decreased safety awareness;Decreased knowledge of precautions;Pain       PT Treatment Interventions DME instruction;Gait training;Functional mobility training;Therapeutic activities;Therapeutic exercise;Balance training;Patient/family education    PT Goals (Current goals can be found in the Care Plan section)  Acute Rehab PT Goals Patient Stated Goal: to go home PT Goal Formulation: With patient Time For Goal Achievement: 10/07/16 Potential to Achieve Goals: Good    Frequency Min 3X/week   Barriers to discharge        Co-evaluation               End of Session Equipment Utilized During Treatment: Gait belt Activity Tolerance: Patient limited by fatigue Patient left: in chair;with call bell/phone within reach;with family/visitor present Nurse Communication: Mobility status PT Visit Diagnosis: Unsteadiness on feet (R26.81);Muscle weakness (generalized) (M62.81)    Time: 7948-0165 PT Time Calculation (min) (ACUTE ONLY): 23 min   Charges:   PT Evaluation $PT Eval Moderate Complexity: 1 Procedure PT Treatments $Gait Training: 8-22 mins   PT G Codes:        Graham Hyun,PT Acute Rehabilitation [phone] (pager)   Denice Paradise 09/23/2016, 2:01 PM

## 2016-09-23 NOTE — Progress Notes (Signed)
Pt hypertensive 203/63, recheck 199/61. Asymptomatic. Dr. Koleen Nimrod text paged, and returned call. Stated to leave patient as is for right now, no new orders received

## 2016-09-23 NOTE — Discharge Summary (Signed)
Discharge Summary    Patient ID: [name],  MRN: [id number], DOB/AGE: 1930-10-17 83 y.o.  Admit date: 09/18/2016 Discharge date: 09/23/2016  Primary Care Provider: ROBBINS,ROBERT A Primary Cardiologist: Dr. Claiborne Billings   Discharge Diagnoses    Principal Problem:   Hypertensive urgency Active Problems:   Pacemaker-[employer]-CRT   CAD (coronary artery disease)   History of mitral valve repair   Atrial fibrillation (HCC)   Hypertension   Chronic anticoagulation - apixaban   Anemia   Nausea without vomiting   Heart failure (HCC)   Allergies Allergies  Allergen Reactions  . Dilaudid [Hydromorphone Hcl] Other (See Comments)    "jerking"  . Morphine And Related Other (See Comments)    "jerking"  . Sulfa Antibiotics Other (See Comments)    unknown    Diagnostic Studies/Procedures     2D Echo 09/21/16 Study Conclusions  - Left ventricle: The cavity size was normal. Wall thickness was   increased in a pattern of mild LVH. Systolic function was normal.   The estimated ejection fraction was in the range of 55% to 60%.   Incoordinate septal motion. The study is not technically   sufficient to allow evaluation of LV diastolic function. - Aortic valve: Trileaflet. Sclerosis without stenosis. There was   mild regurgitation. - Mitral valve: Prior annuloplasty repair. No significant stenosis.   There was trivial regurgitation. - Left atrium: The atrium was normal in size. - Right ventricle: The cavity size was mildly dilated. Moderately   reduced systolic function by TAPSE. Pacer wire or catheter noted   in right ventricle. - Right atrium: Pacer wire or catheter noted in right atrium. - Tricuspid valve: There was mild regurgitation. - Pulmonary arteries: PA peak pressure: 35 mm Hg (S). - Inferior vena cava: The vessel was normal in size. The   respirophasic diameter changes were in the normal range (>= 50%),   consistent with normal central venous  pressure.  Impressions:  - Compared to a prior study in 2015, the LVEF is stable. The prior   mitral valve repair is stable. Pacer wires are noted with   incoordinate septal motion. RVSP is top normal at 35 mmHg. _____________________ NST 09/20/16 FINDINGS: Perfusion: No decreased activity in the left ventricle on stress imaging to suggest reversible ischemia or infarction.  Wall Motion: Septal hypokinesis as before. Otherwise Normal left ventricular wall motion. No left ventricular dilation.  Left Ventricular Ejection Fraction: 52 %  End diastolic volume 87 ml  End systolic volume 42 ml  IMPRESSION: 1. No reversible ischemia or infarction.  2. Chronic septal hypokinesis, otherwise normal left ventricular wall motion.  3. Left ventricular ejection fraction 52%  4. Low-risk stress test findings*. _______________________ EGD 09/21/16 Findings: The stomach was normal. No evidence of gastritis or gastric ulcer The examined duodenum was normal. No evidence of AVM in proximal small bowel - Normal esophagus. - Normal stomach. - Normal examined duodenum. - No specimens collected. Impression: N/A ________________________ Colonoscopy 09/22/16 The digital rectal exam findings include decreased sphincter tone. Findings: A patchy area of moderately erythematous, friable mucosa was found in the ascending colon. A 20 mm polypoid lesion was found at the ileocecal valve. The lesion was multi-lobulated. Friable and oozing was present. Biopsies were taken with a cold forceps for histology. Scattered small and large-mouthed diverticula were found in the sigmoid colon and descending colon. There was narrowing of the colon in association with the diverticular opening. There was evidence of diverticular spasm. Internal hemorrhoids were found  during retroflexion. The hemorrhoids were small. - Erythematous mucosa in the ascending colon possible ischemic colitis. - Rule out  malignancy, polypoid lesion at the ileocecal valve. Biopsied. - Severe diverticulosis in the sigmoid colon and in the descending colon. There was narrowing of the colon in association with the diverticular opening. There was evidence of diverticular spasm. - Internal hemorrhoids.   History of Present Illness     83 y.o.femalewith CAD s/p CABG 2003 (LIMA to LAD, SVG to ramus and mitral valve repair with ring), previous PCI of her native vessels, stable cath 2015, OSA, HTN, HLD, CKD III,TIA,anemia, chronic systolic and diastolic heart failure, ischemic EF 25-35% s/p Bi-V ICD 2014 with EF recovery to 55% in 2015, dementia, atrial fibrillation on apixaban, seizure last month pending EEG who was admitted with chest pain, accelerated HTN and hemoglobin in the 7 range (previously 11). Pt initially presented to [facility] and was transferred to [facility] for further management.   Hospital Course     1. Hypertensive Urgency: reported BP at OSH was in the 220s. Pt was placed on NTG gtt and BP improved. CP resolved. She was continued on Bystolic and amlodipine and irbesartan. BP was monitored and remained stable.   2. Chest Pain: felt to be multifactorial, secondary to Hypertensive urgency and anemia. Troponin was minimally abnormal at 0.03. Repeat troponin was <0.03. 2D echo 09/21/16 showed normal LVEF at 55-60%, mild AI and trivial MR. NST was performed 09/20/16. This was negative for ischemia and infarction. EF 52%. Low risk stress test.   3.GIB/Anemia: Admission Hgb was 7.5 (compared to 11.4 in 2016). Iron studies were abnormal. Iron was low at 27 and saturation ratios low at 7%. Folate also low at 24.8. FOBT was positive. GI was consulted. Recommendations were for EDG. Eliquis was helpd. EGD was performed by Dr. Silverio Decamp on 09/21/16. The study was unremarkable. On 09/22/16, she underwent a colonoscopy. This was also performed by Dr. Bari Edward. Findings are as follows: The digital rectal exam findings include  decreased sphincter tone.A patchy area of moderately erythematous, friable mucosa was found in the ascending colon. A 20 mm polypoid lesion was found at the ileocecal valve. The lesion was multi-lobulated. Friable and oozing was present. Biopsies were taken with a cold forceps for histology (results pending). There is also evere diverticulosis in the sigmoid colon and in the descending colon, in addition to internal hemorrhoids. GI recommended oral iron (ferrous sulphate 325 mg TID with meals). Hold Eliquis until determination of what is needed regarding colon polyp: surgical resection if malignant, endoscopic mucosal resection if benign  4. H/o PAF: pt was on Eliquis for a/c for stroke prophylaxis. This was discontinued due to IDA/GIB with Hgb in the 7 range. Hold Eliquis until determination of what is needed regarding colon polyp: surgical resection if malignant, endoscopic mucosal resection if benign  Dispo: patient was seen and examined by Dr. Harrington Challenger on 09/23/16 who determined she was stable for discharge home. Physical therapy assessed the patient prior to discharge and recommended home health PT. This was arranged. Post hospital f/u will be arranged.   Consultants:  1. Gastroenterology for GIB 2. Physical Therapy for Dispo    Discharge Vitals Blood pressure (!) 159/55, pulse 85, temperature 98.2 F (36.8 C), temperature source Oral, resp. rate 16, height 5\' 3"  (1.6 m), weight 126 lb 12.2 oz (57.5 kg), SpO2 93 %.  Filed Weights   09/18/16 2315 09/21/16 0322  Weight: 126 lb 8.7 oz (57.4 kg) 126 lb 12.2 oz (57.5  kg)    Labs & Radiologic Studies    CBC  Recent Labs  09/22/16 0211 09/23/16 0229  WBC 4.1 3.5*  HGB 7.9* 7.3*  HCT 26.3* 24.0*  MCV 80.9 81.1  PLT 165 656   Basic Metabolic Panel  Recent Labs  09/22/16 0211 09/23/16 0229  NA 139 138  K 3.4* 3.6  CL 104 105  CO2 25 26  GLUCOSE 107* 120*  BUN 8 9  CREATININE 1.18* 1.18*  CALCIUM 9.0 8.7*   Liver Function  Tests No results for input(s): AST, ALT, ALKPHOS, BILITOT, PROT, ALBUMIN in the last 72 hours. No results for input(s): LIPASE, AMYLASE in the last 72 hours. Cardiac Enzymes No results for input(s): CKTOTAL, CKMB, CKMBINDEX, TROPONINI in the last 72 hours. BNP Invalid input(s): POCBNP D-Dimer No results for input(s): DDIMER in the last 72 hours. Hemoglobin A1C No results for input(s): HGBA1C in the last 72 hours. Fasting Lipid Panel No results for input(s): CHOL, HDL, LDLCALC, TRIG, CHOLHDL, LDLDIRECT in the last 72 hours. Thyroid Function Tests No results for input(s): TSH, T4TOTAL, T3FREE, THYROIDAB in the last 72 hours.  Invalid input(s): FREET3 _____________  Nm Myocar Multi W/spect W/wall Motion / Ef  Result Date: 09/20/2016 CLINICAL DATA:  Of chest pain.  Previous CABG.  Shortness of breath. EXAM: MYOCARDIAL IMAGING WITH SPECT (REST AND PHARMACOLOGIC-STRESS) GATED LEFT VENTRICULAR WALL MOTION STUDY LEFT VENTRICULAR EJECTION FRACTION TECHNIQUE: Standard myocardial SPECT imaging was performed after resting intravenous injection of 10 mCi Tc-37m sestamibi. Subsequently, intravenous infusion of Lexiscan was performed under the supervision of the Cardiology staff. At peak effect of the drug, 30 mCi Tc-18m sestamibi was injected intravenously and standard myocardial SPECT imaging was performed. Quantitative gated imaging was also performed to evaluate left ventricular wall motion, and estimate left ventricular ejection fraction. COMPARISON:  None. FINDINGS: Perfusion: No decreased activity in the left ventricle on stress imaging to suggest reversible ischemia or infarction. Wall Motion: Septal hypokinesis as before. Otherwise Normal left ventricular wall motion. No left ventricular dilation. Left Ventricular Ejection Fraction: 52 % End diastolic volume 87 ml End systolic volume 42 ml IMPRESSION: 1. No reversible ischemia or infarction. 2. Chronic septal hypokinesis, otherwise normal left  ventricular wall motion. 3. Left ventricular ejection fraction 52% 4. Low-risk stress test findings*. *2012 Appropriate Use Criteria for Coronary Revascularization Focused Update: J Am Coll Cardiol. 8127;51(7):001-749. http://content.[email or web address].aspx?articleid=[email or web address] Electronically Signed   By: Lucrezia Europe M.D.   On: 09/20/2016 16:05   Dg Chest Port 1 View  Result Date: 09/19/2016 CLINICAL DATA:  Acute onset of heart failure.  Initial encounter. EXAM: PORTABLE CHEST 1 VIEW COMPARISON:  Chest radiograph performed earlier today at 5:12 p.m. FINDINGS: The lungs are well-aerated. Minimal bilateral atelectasis is noted. There is no evidence of pleural effusion or pneumothorax. The cardiomediastinal silhouette is mildly enlarged. The patient is status post median sternotomy, with evidence of prior CABG. A pacemaker is noted overlying the left chest wall, with leads ending overlying the right atrium, right ventricle and coronary sinus all. No acute osseous abnormalities are seen. IMPRESSION: Minimal bilateral atelectasis noted.  Mild cardiomegaly. Electronically Signed   By: Garald Balding M.D.   On: 09/19/2016 00:02   Disposition   Pt is being discharged home today in good condition.  Follow-up Plans & Appointments    Follow-up Information    [facility] Follow up.   Why:  Physical therapy  Contact information: [address] [place] [address] [phone]  Shelva Majestic, MD Follow up.   Specialty:  Cardiology Why:  our office will call you with a hospital follow-up visit Contact information: [address] [place] [address] [phone]          Discharge Instructions    Face-to-face encounter (required for Medicare/Medicaid patients)    Complete by:  As directed    I Lyda Jester certify that this patient is under my care and that I, or a nurse practitioner or physician's assistant working with me, had a  face-to-face encounter that meets the physician face-to-face encounter requirements with this patient on 09/23/2016. The encounter with the patient was in whole, or in part for the following medical condition(s) which is the primary reason for home health care (List medical condition): symptomatic anemia   The encounter with the patient was in whole, or in part, for the following medical condition, which is the primary reason for home health care:  symptomatic anemia   I certify that, based on my findings, the following services are medically necessary home health services:  Physical therapy   Reason for Medically Necessary Home Health Services:  Skilled Nursing- Change/Decline in Patient Status   My clinical findings support the need for the above services:  Shortness of breath with activity   Further, I certify that my clinical findings support that this patient is homebound due to:  Shortness of Breath with activity   Home Health    Complete by:  As directed    To provide the following care/treatments:  PT      Discharge Medications   Current Discharge Medication List    START taking these medications   Details  ferrous sulfate 325 (65 FE) MG tablet Take 1 tablet (325 mg total) by mouth 3 (three) times daily with meals. Qty: 90 tablet, Refills: 11    irbesartan (AVAPRO) 300 MG tablet Take 1 tablet (300 mg total) by mouth daily. Qty: 30 tablet, Refills: 5      CONTINUE these medications which have CHANGED   Details  amLODipine (NORVASC) 5 MG tablet Take 1 tablet (5 mg total) by mouth daily. Qty: 30 tablet, Refills: 5      CONTINUE these medications which have NOT CHANGED   Details  busPIRone (BUSPAR) 10 MG tablet Take 10 mg by mouth 2 (two) times daily.    BYSTOLIC 5 MG tablet TAKE ONE (1) TABLET AT BEDTIME Qty: 30 tablet, Refills: 11    donepezil (ARICEPT) 10 MG tablet Take 10 mg by mouth at bedtime.    ergocalciferol (VITAMIN D2) 50000 UNITS capsule Take 50,000 Units by  mouth once a week.    furosemide (LASIX) 20 MG tablet Take 1 tablet (20 mg total) by mouth daily. PLEASE CONTACT OFFICE FOR ADDITIONAL REFILLS Qty: 15 tablet, Refills: 0    omeprazole (PRILOSEC) 20 MG capsule Take 20 mg by mouth daily.    PARoxetine (PAXIL) 30 MG tablet Take 30 mg by mouth every evening.    Calcium Carbonate-Vitamin D (CALCIUM 600/VITAMIN D) 600-400 MG-UNIT per tablet Take 1 tablet by mouth daily.    NITROSTAT 0.4 MG SL tablet PLACE 1 TAB UNDER TONGUE EVERY 5 MINUTES UP TO 3 DOSES FOR CHEST PAIN Qty: 25 tablet, Refills: 2    potassium chloride (K-DUR) 10 MEQ tablet Take 10 mEq by mouth as needed (with lasix, fulid, edema).       STOP taking these medications     ELIQUIS 2.5 MG TABS tablet  Outstanding Labs/Studies   None   Duration of Discharge Encounter   Greater than 30 minutes including physician time.  Signed, Lyda Jester PA-C 09/23/2016, 1:53 PM

## 2016-09-24 ENCOUNTER — Encounter (HOSPITAL_COMMUNITY): Payer: Self-pay | Admitting: Gastroenterology

## 2016-09-29 ENCOUNTER — Telehealth: Payer: Self-pay | Admitting: Gastroenterology

## 2016-09-29 DIAGNOSIS — E559 Vitamin D deficiency, unspecified: Secondary | ICD-10-CM | POA: Diagnosis not present

## 2016-09-29 DIAGNOSIS — R05 Cough: Secondary | ICD-10-CM | POA: Diagnosis not present

## 2016-09-29 DIAGNOSIS — R110 Nausea: Secondary | ICD-10-CM | POA: Diagnosis not present

## 2016-09-29 DIAGNOSIS — R61 Generalized hyperhidrosis: Secondary | ICD-10-CM | POA: Diagnosis not present

## 2016-09-29 DIAGNOSIS — Z6823 Body mass index (BMI) 23.0-23.9, adult: Secondary | ICD-10-CM | POA: Diagnosis not present

## 2016-09-30 NOTE — Telephone Encounter (Signed)
Spoke with the spouse. Patient has early demetia per him and he helps her.  He is aware of the results. He is in touch with her primary GI and will follow up next week.

## 2016-10-01 DIAGNOSIS — I110 Hypertensive heart disease with heart failure: Secondary | ICD-10-CM | POA: Diagnosis not present

## 2016-10-01 DIAGNOSIS — G309 Alzheimer's disease, unspecified: Secondary | ICD-10-CM | POA: Diagnosis not present

## 2016-10-01 DIAGNOSIS — I480 Paroxysmal atrial fibrillation: Secondary | ICD-10-CM | POA: Diagnosis not present

## 2016-10-01 DIAGNOSIS — F329 Major depressive disorder, single episode, unspecified: Secondary | ICD-10-CM | POA: Diagnosis not present

## 2016-10-01 DIAGNOSIS — R569 Unspecified convulsions: Secondary | ICD-10-CM | POA: Diagnosis not present

## 2016-10-01 DIAGNOSIS — R079 Chest pain, unspecified: Secondary | ICD-10-CM | POA: Diagnosis not present

## 2016-10-01 DIAGNOSIS — F0280 Dementia in other diseases classified elsewhere without behavioral disturbance: Secondary | ICD-10-CM | POA: Diagnosis not present

## 2016-10-01 DIAGNOSIS — Z7901 Long term (current) use of anticoagulants: Secondary | ICD-10-CM | POA: Diagnosis not present

## 2016-10-01 DIAGNOSIS — Z951 Presence of aortocoronary bypass graft: Secondary | ICD-10-CM | POA: Diagnosis not present

## 2016-10-01 DIAGNOSIS — I160 Hypertensive urgency: Secondary | ICD-10-CM | POA: Diagnosis not present

## 2016-10-01 DIAGNOSIS — I5022 Chronic systolic (congestive) heart failure: Secondary | ICD-10-CM | POA: Diagnosis not present

## 2016-10-01 DIAGNOSIS — I951 Orthostatic hypotension: Secondary | ICD-10-CM | POA: Diagnosis not present

## 2016-10-01 DIAGNOSIS — Z950 Presence of cardiac pacemaker: Secondary | ICD-10-CM | POA: Diagnosis not present

## 2016-10-01 DIAGNOSIS — F419 Anxiety disorder, unspecified: Secondary | ICD-10-CM | POA: Diagnosis not present

## 2016-10-01 DIAGNOSIS — I255 Ischemic cardiomyopathy: Secondary | ICD-10-CM | POA: Diagnosis not present

## 2016-10-01 DIAGNOSIS — I2510 Atherosclerotic heart disease of native coronary artery without angina pectoris: Secondary | ICD-10-CM | POA: Diagnosis not present

## 2016-10-20 ENCOUNTER — Ambulatory Visit: Payer: Medicare Other | Admitting: Neurology

## 2016-11-04 DIAGNOSIS — R1084 Generalized abdominal pain: Secondary | ICD-10-CM | POA: Diagnosis not present

## 2016-11-06 ENCOUNTER — Ambulatory Visit: Payer: Medicare Other | Admitting: Cardiovascular Disease

## 2016-11-10 DIAGNOSIS — F419 Anxiety disorder, unspecified: Secondary | ICD-10-CM | POA: Diagnosis not present

## 2016-11-10 DIAGNOSIS — R61 Generalized hyperhidrosis: Secondary | ICD-10-CM | POA: Diagnosis not present

## 2016-11-10 DIAGNOSIS — D508 Other iron deficiency anemias: Secondary | ICD-10-CM | POA: Diagnosis not present

## 2016-11-18 ENCOUNTER — Ambulatory Visit (INDEPENDENT_AMBULATORY_CARE_PROVIDER_SITE_OTHER): Payer: Medicare Other | Admitting: *Deleted

## 2016-11-18 DIAGNOSIS — I495 Sick sinus syndrome: Secondary | ICD-10-CM

## 2016-11-20 NOTE — Addendum Note (Signed)
Addendum  created 11/20/16 1320 by Rica Koyanagi, MD   Sign clinical note

## 2016-11-20 NOTE — Progress Notes (Signed)
Remote pacemaker transmission.   

## 2016-11-27 ENCOUNTER — Encounter: Payer: Self-pay | Admitting: Cardiology

## 2016-12-01 LAB — CUP PACEART REMOTE DEVICE CHECK
Battery Remaining Percentage: 80 %
Brady Statistic AP VP Percent: 66 %
Brady Statistic AS VS Percent: 1 %
Brady Statistic RA Percent Paced: 65 %
Implantable Lead Implant Date: 20070720
Implantable Lead Implant Date: 20140602
Implantable Lead Location: 753858
Implantable Lead Location: 753859
Implantable Lead Model: 5076
Lead Channel Impedance Value: 960 Ohm
Lead Channel Pacing Threshold Amplitude: 0.5 V
Lead Channel Pacing Threshold Amplitude: 1.625 V
Lead Channel Pacing Threshold Pulse Width: 0.4 ms
Lead Channel Sensing Intrinsic Amplitude: 1.5 mV
Lead Channel Sensing Intrinsic Amplitude: 12 mV
Lead Channel Setting Pacing Amplitude: 2.625
Lead Channel Setting Sensing Sensitivity: 5 mV
MDC IDC LEAD IMPLANT DT: 20070720
MDC IDC LEAD LOCATION: 753860
MDC IDC MSMT BATTERY REMAINING LONGEVITY: 55 mo
MDC IDC MSMT BATTERY VOLTAGE: 2.93 V
MDC IDC MSMT LEADCHNL LV PACING THRESHOLD PULSEWIDTH: 1 ms
MDC IDC MSMT LEADCHNL RA IMPEDANCE VALUE: 390 Ohm
MDC IDC MSMT LEADCHNL RA PACING THRESHOLD AMPLITUDE: 0.5 V
MDC IDC MSMT LEADCHNL RV IMPEDANCE VALUE: 600 Ohm
MDC IDC MSMT LEADCHNL RV PACING THRESHOLD PULSEWIDTH: 0.5 ms
MDC IDC PG IMPLANT DT: 20140602
MDC IDC SESS DTM: 20180530143741
MDC IDC SET LEADCHNL LV PACING PULSEWIDTH: 1 ms
MDC IDC SET LEADCHNL RA PACING AMPLITUDE: 2 V
MDC IDC SET LEADCHNL RV PACING AMPLITUDE: 2.5 V
MDC IDC SET LEADCHNL RV PACING PULSEWIDTH: 0.5 ms
MDC IDC STAT BRADY AP VS PERCENT: 1 %
MDC IDC STAT BRADY AS VP PERCENT: 34 %
Pulse Gen Serial Number: 2960456

## 2016-12-15 DIAGNOSIS — E559 Vitamin D deficiency, unspecified: Secondary | ICD-10-CM | POA: Diagnosis not present

## 2016-12-15 DIAGNOSIS — E782 Mixed hyperlipidemia: Secondary | ICD-10-CM | POA: Diagnosis not present

## 2016-12-15 DIAGNOSIS — F341 Dysthymic disorder: Secondary | ICD-10-CM | POA: Diagnosis not present

## 2016-12-15 DIAGNOSIS — I110 Hypertensive heart disease with heart failure: Secondary | ICD-10-CM | POA: Diagnosis not present

## 2016-12-15 DIAGNOSIS — F419 Anxiety disorder, unspecified: Secondary | ICD-10-CM | POA: Diagnosis not present

## 2016-12-15 DIAGNOSIS — D649 Anemia, unspecified: Secondary | ICD-10-CM | POA: Diagnosis not present

## 2016-12-15 DIAGNOSIS — K219 Gastro-esophageal reflux disease without esophagitis: Secondary | ICD-10-CM | POA: Diagnosis not present

## 2016-12-15 DIAGNOSIS — I5022 Chronic systolic (congestive) heart failure: Secondary | ICD-10-CM | POA: Diagnosis not present

## 2017-01-13 DIAGNOSIS — R300 Dysuria: Secondary | ICD-10-CM | POA: Diagnosis not present

## 2017-01-13 DIAGNOSIS — B372 Candidiasis of skin and nail: Secondary | ICD-10-CM | POA: Diagnosis not present

## 2017-01-19 DIAGNOSIS — R300 Dysuria: Secondary | ICD-10-CM | POA: Diagnosis not present

## 2017-01-25 DIAGNOSIS — N3000 Acute cystitis without hematuria: Secondary | ICD-10-CM | POA: Diagnosis not present

## 2017-02-01 DIAGNOSIS — N3000 Acute cystitis without hematuria: Secondary | ICD-10-CM | POA: Diagnosis not present

## 2017-02-13 ENCOUNTER — Other Ambulatory Visit: Payer: Self-pay | Admitting: Cardiology

## 2017-02-15 NOTE — Telephone Encounter (Signed)
Rx(s) sent to pharmacy electronically.  

## 2017-02-17 ENCOUNTER — Encounter: Payer: Medicare Other | Admitting: *Deleted

## 2017-02-19 ENCOUNTER — Encounter: Payer: Self-pay | Admitting: Cardiology

## 2017-03-03 ENCOUNTER — Other Ambulatory Visit: Payer: Self-pay | Admitting: Cardiology

## 2017-03-08 ENCOUNTER — Ambulatory Visit (INDEPENDENT_AMBULATORY_CARE_PROVIDER_SITE_OTHER): Payer: Medicare Other | Admitting: *Deleted

## 2017-03-08 DIAGNOSIS — I495 Sick sinus syndrome: Secondary | ICD-10-CM | POA: Diagnosis not present

## 2017-03-09 NOTE — Progress Notes (Signed)
Remote pacemaker transmission.   

## 2017-03-10 ENCOUNTER — Encounter: Payer: Self-pay | Admitting: Cardiology

## 2017-03-10 LAB — CUP PACEART REMOTE DEVICE CHECK
Battery Remaining Longevity: 42 mo
Battery Remaining Percentage: 63 %
Battery Voltage: 2.9 V
Brady Statistic AP VP Percent: 73 %
Brady Statistic RA Percent Paced: 72 %
Implantable Lead Implant Date: 20070720
Implantable Lead Implant Date: 20070720
Implantable Lead Location: 753858
Implantable Lead Location: 753859
Implantable Lead Location: 753860
Implantable Pulse Generator Implant Date: 20140602
Lead Channel Impedance Value: 410 Ohm
Lead Channel Impedance Value: 930 Ohm
Lead Channel Pacing Threshold Amplitude: 0.5 V
Lead Channel Pacing Threshold Amplitude: 2.125 V
Lead Channel Pacing Threshold Pulse Width: 0.4 ms
Lead Channel Sensing Intrinsic Amplitude: 2.2 mV
Lead Channel Setting Pacing Amplitude: 2 V
Lead Channel Setting Pacing Amplitude: 3.125
Lead Channel Setting Pacing Pulse Width: 0.5 ms
Lead Channel Setting Sensing Sensitivity: 5 mV
MDC IDC LEAD IMPLANT DT: 20140602
MDC IDC MSMT LEADCHNL LV PACING THRESHOLD PULSEWIDTH: 1 ms
MDC IDC MSMT LEADCHNL RV IMPEDANCE VALUE: 590 Ohm
MDC IDC MSMT LEADCHNL RV PACING THRESHOLD AMPLITUDE: 0.5 V
MDC IDC MSMT LEADCHNL RV PACING THRESHOLD PULSEWIDTH: 0.5 ms
MDC IDC MSMT LEADCHNL RV SENSING INTR AMPL: 12 mV
MDC IDC SESS DTM: 20180918004109
MDC IDC SET LEADCHNL LV PACING PULSEWIDTH: 1 ms
MDC IDC SET LEADCHNL RV PACING AMPLITUDE: 2.5 V
MDC IDC STAT BRADY AP VS PERCENT: 1 %
MDC IDC STAT BRADY AS VP PERCENT: 27 %
MDC IDC STAT BRADY AS VS PERCENT: 1 %
Pulse Gen Serial Number: 2960456

## 2017-03-19 DIAGNOSIS — N3090 Cystitis, unspecified without hematuria: Secondary | ICD-10-CM | POA: Diagnosis not present

## 2017-03-19 DIAGNOSIS — I2510 Atherosclerotic heart disease of native coronary artery without angina pectoris: Secondary | ICD-10-CM | POA: Diagnosis not present

## 2017-03-29 ENCOUNTER — Encounter: Payer: Self-pay | Admitting: Internal Medicine

## 2017-04-08 ENCOUNTER — Ambulatory Visit (INDEPENDENT_AMBULATORY_CARE_PROVIDER_SITE_OTHER): Payer: Medicare Other | Admitting: Internal Medicine

## 2017-04-08 ENCOUNTER — Encounter: Payer: Self-pay | Admitting: Internal Medicine

## 2017-04-08 VITALS — BP 142/58 | HR 64 | Ht 63.0 in | Wt 120.2 lb

## 2017-04-08 DIAGNOSIS — I495 Sick sinus syndrome: Secondary | ICD-10-CM | POA: Diagnosis not present

## 2017-04-08 DIAGNOSIS — Z950 Presence of cardiac pacemaker: Secondary | ICD-10-CM | POA: Diagnosis not present

## 2017-04-08 DIAGNOSIS — I480 Paroxysmal atrial fibrillation: Secondary | ICD-10-CM | POA: Diagnosis not present

## 2017-04-08 DIAGNOSIS — I442 Atrioventricular block, complete: Secondary | ICD-10-CM | POA: Diagnosis not present

## 2017-04-08 DIAGNOSIS — I5022 Chronic systolic (congestive) heart failure: Secondary | ICD-10-CM

## 2017-04-08 LAB — CUP PACEART INCLINIC DEVICE CHECK
Battery Remaining Longevity: 40 mo
Battery Voltage: 2.9 V
Brady Statistic RA Percent Paced: 73 %
Implantable Lead Implant Date: 20070720
Implantable Lead Implant Date: 20140602
Implantable Lead Location: 753858
Implantable Lead Location: 753859
Implantable Lead Model: 5076
Lead Channel Impedance Value: 1012.5 Ohm
Lead Channel Impedance Value: 437.5 Ohm
Lead Channel Pacing Threshold Amplitude: 0.5 V
Lead Channel Pacing Threshold Pulse Width: 0.4 ms
Lead Channel Pacing Threshold Pulse Width: 1 ms
Lead Channel Sensing Intrinsic Amplitude: 2.6 mV
Lead Channel Setting Pacing Pulse Width: 1 ms
Lead Channel Setting Sensing Sensitivity: 5 mV
MDC IDC LEAD IMPLANT DT: 20070720
MDC IDC LEAD LOCATION: 753860
MDC IDC MSMT LEADCHNL LV PACING THRESHOLD AMPLITUDE: 2.625 V
MDC IDC MSMT LEADCHNL RV IMPEDANCE VALUE: 637.5 Ohm
MDC IDC MSMT LEADCHNL RV PACING THRESHOLD AMPLITUDE: 0.75 V
MDC IDC MSMT LEADCHNL RV PACING THRESHOLD PULSEWIDTH: 0.5 ms
MDC IDC PG IMPLANT DT: 20140602
MDC IDC SESS DTM: 20181018112932
MDC IDC SET LEADCHNL LV PACING AMPLITUDE: 3.625
MDC IDC SET LEADCHNL RA PACING AMPLITUDE: 2 V
MDC IDC SET LEADCHNL RV PACING AMPLITUDE: 2.5 V
MDC IDC SET LEADCHNL RV PACING PULSEWIDTH: 0.5 ms
MDC IDC STAT BRADY RV PERCENT PACED: 99.8 %
Pulse Gen Serial Number: 2960456

## 2017-04-08 NOTE — Progress Notes (Signed)
Patient Care Team: Taylor Broker, MD as PCP - General (Family Medicine)   HPI  [name] is a 83 y.o. female Seen in followup for CRT-P. Upgrade accomplished 6/14.  Device was originally implanted for sick sinus syndrome now w complete Heart block and device dependent.  There was no  appreciable change in functional status  Hx of coronary artery disease with bypass graft and mitral valve repair in 2003 with prior stenting.   2012  Catht>>she  patent LIMA and patent vein graft to the ramus a  large and patent right   Catheterization 2013-December demonstrating 80-85% left main and 70% ostial LAD at the point of an old stent in the ramus occlusion and a large dominant right. Ejection fraction was 35-40% repeat catheterization 05/2013 demonstrated normalization of systolic function  Echo WU/98 confirmed normal LV systolic function  She has afib and was on apixoban   She's had a history of recurrent falls with objective evidence of orthostasis. She also has memory issues and pain for which is treated with gabapentin. She's been diagnosed with spinal stenosis. The family is not clear as to why it was that the anticoagulation was discontinued.  She denies chest pain shortness of breath or peripheral edema. Memory is still pretty good.     Past Medical History:  Diagnosis Date  . Anemia 1940's  . Anginal pain ([place])   . CHF (congestive heart failure) (HCC)    hx  . Chronic anticoagulation    apixaban  . Chronic systolic heart failure ([place]) 10/11/2012  . Complete heart block ([place]) 10/11/2012  . Coronary artery disease   . Depression   . GERD (gastroesophageal reflux disease)   . H/O hiatal hernia   . History of blood transfusion    "related to uretheral OR & when 1st child was born"  . Hyperlipidemia   . Hypertension   . Hypothyroidism   . Ischemic cardiomyopathy- s/p CABG 10/11/2012   EF 39%  . Migraine    ~ qd for 11 months now (05/23/2014)  . OSA on CPAP  10/11/2012   No longer using CPAP due to worsening of Migraines. AHI was .45/hr at 9 cm water pressure. RDI was 0.4/hr. Patient was unable to reach REM sleep and maintain the supine position at optimal pressure. Patient was able to sleep for prolonged periods of time at 9 cm water pressure without suffering further from respiratory events.  [name] 10/11/2012   Updated to BiV  . Shortness of breath   . Stress incontinence    multiple injections of collagen  . Thrombocytopenia ([place])   . Weakness 10/06/12    Past Surgical History:  Procedure Laterality Date  . APPENDECTOMY    . BI-VENTRICULAR PACEMAKER REVISION (CRT-R)  11/21/2012   BIV   UPGRADE    WITH DR Caryl Comes  . BI-VENTRICULAR PACEMAKER UPGRADE N/A 10/31/2012   Procedure: BI-VENTRICULAR PACEMAKER UPGRADE;  Surgeon: Deboraha Sprang, MD;  Location: [facility] CATH LAB;  Service: Cardiovascular;  Laterality: N/A;  . BI-VENTRICULAR PACEMAKER UPGRADE N/A 11/21/2012   Procedure: BI-VENTRICULAR PACEMAKER UPGRADE;  Surgeon: Deboraha Sprang, MD;  Location: [facility] CATH LAB;  Service: Cardiovascular;  Laterality: N/A;  . CARDIAC CATHETERIZATION  05/2012   (1st cath 2012) This required recatheterization by Dr. Claiborne Billings and had a patent LIMA-LAD with distal diffuse disease, patent SVG-OM, 10% to 20% mid right coronary but an EF of about 35%, quantitatively iw was 38.9.  [name] CARDIAC CATHETERIZATION     "  today;s makes 28" (05/23/2014)  . CARDIAC VALVE REPLACEMENT    . CATARACT EXTRACTION, BILATERAL Bilateral   . CHOLECYSTECTOMY    . COLONOSCOPY WITH PROPOFOL N/A 09/22/2016   Procedure: COLONOSCOPY WITH PROPOFOL;  Surgeon: Mauri Pole, MD;  Location: [place] ENDOSCOPY;  Service: Endoscopy;  Laterality: N/A;  . CORONARY ANGIOPLASTY WITH STENT PLACEMENT  2012   No significant CAD, patent but ectatic graft supplying the ramus, patent LIMA-LAD, and an EF of 50% at that time. She had an occluded stent to her circumflex from remote intervention. Required  recatheterization 05/2012 by Dr. Claiborne Billings and had a patent LIMA-LAD with distal diffuse disease, patent SVG-OM, 10%-20% mid right coronary but an EF of about 35%, quantitatively it was 38.9.  . CORONARY ANGIOPLASTY WITH STENT PLACEMENT     "I have a tota of 4" (05/23/2014)  . CORONARY ARTERY BYPASS GRAFT  2003   With LIMA-LAD and vein graft to OM, and associated mitral valve repair with anuloplasty ring and quadrangular resection of the posterior leaflet, 24 mm Seguin ring by Dr. Roxy Manns.  . ESOPHAGOGASTRODUODENOSCOPY N/A 09/21/2016   Procedure: ESOPHAGOGASTRODUODENOSCOPY (EGD);  Surgeon: Mauri Pole, MD;  Location: [facility] ENDOSCOPY;  Service: Endoscopy;  Laterality: N/A;  . INCONTINENCE SURGERY    . INSERT / REPLACE / REMOVE PACEMAKER  2007  . LEFT HEART CATHETERIZATION WITH CORONARY ANGIOGRAM N/A 06/07/2013   Procedure: LEFT HEART CATHETERIZATION WITH CORONARY ANGIOGRAM;  Surgeon: Lorretta Harp, MD;  Location[facility] CATH LAB;  Service: Cardiovascular;  Laterality: N/A;  . LEFT HEART CATHETERIZATION WITH CORONARY/GRAFT ANGIOGRAM N/A 05/23/2012   Procedure: LEFT HEART CATHETERIZATION WITH Beatrix Fetters;  Surgeon: Troy Sine, MD;  Location[facility] C[facility] LAB;  Service: Cardiovascular;  Laterality: N/A;  . LEFT HEART CATHETERIZATION WITH CORONARY/GRAFT ANGIOGRAM N/A 05/23/2014   Procedure: LEFT HEART CATHETERIZATION WITH Beatrix Fetters;  Surgeon: Wellington Hampshire, MD;  Location: [place] CATH LAB;  Service: Cardiovascular;  Laterality: N/A;  . lexiscan myocardial stress test  08/20/11   The EF = 34%. This is a low risk scan. There is a new fix abnormality in the distal LAD artery territory. Although no reversibility is seen, high grade obstruction with resting ischemia cannot be excluded.  [name] PACEMAKER GENERATOR CHANGE    . US ECHOCARDIOGRAPHY  04/13/12   EF - 25% to 35% with intraventricular dyssynchrony, RVSP of 46, moderate TR, and mild to moderate AI. She had a mild increased mitral valve  gradient of 5 mmHg with minimal MR.   [name] VAGINAL HYSTERECTOMY      Current Outpatient Prescriptions  Medication Sig Dispense Refill  . amLODipine (NORVASC) 5 MG tablet Take 1 tablet (5 mg total) by mouth daily. <PLEASE MAKE APPOINTMENT FOR REFILLS> 30 tablet 0  . busPIRone (BUSPAR) 10 MG tablet Take 10 mg by mouth 2 (two) times daily.    [name] BYSTOLIC 5 MG tablet TAKE ONE (1) TABLET AT BEDTIME 30 tablet 11  . Calcium Carbonate-Vitamin D (CALCIUM 600/VITAMIN D) 600-400 MG-UNIT per tablet Take 1 tablet by mouth daily.    [name] donepezil (ARICEPT) 10 MG tablet Take 10 mg by mouth at bedtime.    . ergocalciferol (VITAMIN D2) 50000 UNITS capsule Take 50,000 Units by mouth 2 (two) times a week.     . ferrous sulfate 325 (65 FE) MG tablet Take 1 tablet (325 mg total) by mouth 3 (three) times daily with meals. 90 tablet 11  . furosemide (LASIX) 20 MG tablet Take 1 tablet (20 mg total) by mouth  daily. PLEASE CONTACT OFFICE FOR ADDITIONAL REFILLS 15 tablet 0  . irbesartan (AVAPRO) 300 MG tablet TAKE ONE (1) TABLET ONCE DAILY 30 tablet 1  . NITROSTAT 0.4 MG SL tablet PLACE 1 TAB UNDER TONGUE EVERY 5 MINUTES UP TO 3 DOSES FOR CHEST PAIN 25 tablet 2  . omeprazole (PRILOSEC) 20 MG capsule Take 20 mg by mouth daily.    [name] PARoxetine (PAXIL) 30 MG tablet Take 30 mg by mouth every evening.    . potassium chloride (K-DUR) 10 MEQ tablet Take 10 mEq by mouth as needed (with lasix, fulid, edema).      No current facility-administered medications for this visit.     Allergies  Allergen Reactions  . Buprenorphine Hcl Other (See Comments)    "jerking"  . Dilaudid [Hydromorphone Hcl] Other (See Comments)    "jerking"  . Morphine And Related Other (See Comments)    "jerking"   . Hydromorphone     unknown  . Morphine Other (See Comments)    unknown  . Ramipril Other (See Comments)    Unknown   . Sulfur Other (See Comments)    unknown  . Sulfa Antibiotics Other (See Comments)    unknown  . Sulfasalazine Other  (See Comments)    unknown    Review of Systems negative except from HPI and PMH  Physical Exam BP (!) 142/58   Pulse 64   Ht 5\' 3"  (1.6 m)   Wt 120 lb 3.2 oz (54.5 kg)   SpO2 95%   BMI 21.29 kg/m  Well developed and nourished in no acute distress HENT normal Neck supple with JVP-flat Carotids brisk and full without bruits Clear Regular rate and rhythm, no murmurs or gallops Abd-soft with active BS without hepatomegaly No Clubbing cyanosis edema Skin-warm and dry A & Oriented  Grossly normal sensory and motor function   ECG demonstrates AV pacing    Assessment and  Plan  Coronary disease with prior bypass surgery  Atrial fibrillation detected on her device  Sick sinus syndrome  Pacemaker-St. Jude The patient's device was interrogated.  The information was reviewed. No changes were made in the programming.    Congestive heart failure-chronic-diastolic  Falls--Orthostatic hypotension\  Her CHADS-VASc score is greater than  6 or 7  Her annual his risk of stroke is in the 5-10% range. I think she should be on anticoagulation. I will defer this decision to Dr. Leda Gauze. I will let him know my thoughts. Her fall history is not sufficient I think to justify no anticoagulation  Euvolemic continue current meds  Without symptoms of ischemia

## 2017-04-08 NOTE — Patient Instructions (Addendum)
Medication Instructions:  Your physician recommends that you continue on your current medications as directed. Please refer to the Current Medication list given to you today.  Labwork: None ordered  Testing/Procedures: None ordered  Follow-Up: Remote monitoring is used to monitor your Pacemaker from home. This monitoring reduces the number of office visits required to check your device to one time per year. It allows [place] to keep an eye on the functioning of your device to ensure it is working properly. You are scheduled for a device check from home on 06/07/2017. You may send your transmission at any time that day. If you have a wireless device, the transmission will be sent automatically. After your physician reviews your transmission, you will receive a postcard with your next transmission date.  Your physician wants you to follow-up in: 1 year with Tommye Standard, PA.  You will receive a reminder letter in the mail two months in advance. If you don't receive a letter, please call our office to schedule the follow-up appointment.  --- If you need a refill on your cardiac medications before your next appointment, please call your pharmacy. ---  Thank you for choosing [facility]!!    Any Other Special Instructions Will Be Listed Below (If Applicable).

## 2017-04-09 ENCOUNTER — Encounter: Payer: Medicare Other | Admitting: Internal Medicine

## 2017-04-19 ENCOUNTER — Other Ambulatory Visit: Payer: Self-pay | Admitting: Cardiovascular Disease

## 2017-04-19 NOTE — Telephone Encounter (Signed)
REFILL 

## 2017-04-23 DIAGNOSIS — R61 Generalized hyperhidrosis: Secondary | ICD-10-CM | POA: Diagnosis not present

## 2017-04-23 DIAGNOSIS — Z23 Encounter for immunization: Secondary | ICD-10-CM | POA: Diagnosis not present

## 2017-04-23 DIAGNOSIS — R197 Diarrhea, unspecified: Secondary | ICD-10-CM | POA: Diagnosis not present

## 2017-04-27 DIAGNOSIS — R197 Diarrhea, unspecified: Secondary | ICD-10-CM | POA: Diagnosis not present

## 2017-05-06 DIAGNOSIS — R300 Dysuria: Secondary | ICD-10-CM | POA: Diagnosis not present

## 2017-05-06 DIAGNOSIS — F0390 Unspecified dementia without behavioral disturbance: Secondary | ICD-10-CM | POA: Diagnosis not present

## 2017-05-06 DIAGNOSIS — D649 Anemia, unspecified: Secondary | ICD-10-CM | POA: Diagnosis not present

## 2017-05-06 DIAGNOSIS — R441 Visual hallucinations: Secondary | ICD-10-CM | POA: Diagnosis not present

## 2017-05-06 DIAGNOSIS — F419 Anxiety disorder, unspecified: Secondary | ICD-10-CM | POA: Diagnosis not present

## 2017-05-06 DIAGNOSIS — Z87440 Personal history of urinary (tract) infections: Secondary | ICD-10-CM | POA: Diagnosis not present

## 2017-05-11 DIAGNOSIS — Z950 Presence of cardiac pacemaker: Secondary | ICD-10-CM | POA: Diagnosis not present

## 2017-05-11 DIAGNOSIS — R5381 Other malaise: Secondary | ICD-10-CM | POA: Diagnosis not present

## 2017-05-11 DIAGNOSIS — Z23 Encounter for immunization: Secondary | ICD-10-CM | POA: Diagnosis not present

## 2017-05-11 DIAGNOSIS — M1990 Unspecified osteoarthritis, unspecified site: Secondary | ICD-10-CM | POA: Diagnosis not present

## 2017-05-11 DIAGNOSIS — R197 Diarrhea, unspecified: Secondary | ICD-10-CM | POA: Diagnosis not present

## 2017-05-11 DIAGNOSIS — E039 Hypothyroidism, unspecified: Secondary | ICD-10-CM | POA: Diagnosis not present

## 2017-05-11 DIAGNOSIS — Z951 Presence of aortocoronary bypass graft: Secondary | ICD-10-CM | POA: Diagnosis not present

## 2017-05-11 DIAGNOSIS — Z792 Long term (current) use of antibiotics: Secondary | ICD-10-CM | POA: Diagnosis not present

## 2017-05-11 DIAGNOSIS — Z8781 Personal history of (healed) traumatic fracture: Secondary | ICD-10-CM | POA: Diagnosis not present

## 2017-05-11 DIAGNOSIS — Z794 Long term (current) use of insulin: Secondary | ICD-10-CM | POA: Diagnosis not present

## 2017-05-11 DIAGNOSIS — E538 Deficiency of other specified B group vitamins: Secondary | ICD-10-CM | POA: Diagnosis not present

## 2017-05-11 DIAGNOSIS — R443 Hallucinations, unspecified: Secondary | ICD-10-CM | POA: Diagnosis not present

## 2017-05-11 DIAGNOSIS — Z8673 Personal history of transient ischemic attack (TIA), and cerebral infarction without residual deficits: Secondary | ICD-10-CM | POA: Diagnosis not present

## 2017-05-11 DIAGNOSIS — F0390 Unspecified dementia without behavioral disturbance: Secondary | ICD-10-CM | POA: Diagnosis not present

## 2017-05-11 DIAGNOSIS — I252 Old myocardial infarction: Secondary | ICD-10-CM | POA: Diagnosis not present

## 2017-05-11 DIAGNOSIS — N200 Calculus of kidney: Secondary | ICD-10-CM | POA: Diagnosis not present

## 2017-05-11 DIAGNOSIS — I10 Essential (primary) hypertension: Secondary | ICD-10-CM | POA: Diagnosis not present

## 2017-05-11 DIAGNOSIS — R531 Weakness: Secondary | ICD-10-CM | POA: Diagnosis not present

## 2017-05-11 DIAGNOSIS — R0602 Shortness of breath: Secondary | ICD-10-CM | POA: Diagnosis not present

## 2017-05-11 DIAGNOSIS — R270 Ataxia, unspecified: Secondary | ICD-10-CM | POA: Diagnosis not present

## 2017-05-11 DIAGNOSIS — Z79899 Other long term (current) drug therapy: Secondary | ICD-10-CM | POA: Diagnosis not present

## 2017-05-11 DIAGNOSIS — R262 Difficulty in walking, not elsewhere classified: Secondary | ICD-10-CM | POA: Diagnosis not present

## 2017-05-11 DIAGNOSIS — R109 Unspecified abdominal pain: Secondary | ICD-10-CM | POA: Diagnosis not present

## 2017-05-11 DIAGNOSIS — I2510 Atherosclerotic heart disease of native coronary artery without angina pectoris: Secondary | ICD-10-CM | POA: Diagnosis not present

## 2017-05-11 DIAGNOSIS — R269 Unspecified abnormalities of gait and mobility: Secondary | ICD-10-CM | POA: Diagnosis not present

## 2017-05-12 DIAGNOSIS — M4004 Postural kyphosis, thoracic region: Secondary | ICD-10-CM | POA: Diagnosis not present

## 2017-05-12 DIAGNOSIS — R262 Difficulty in walking, not elsewhere classified: Secondary | ICD-10-CM | POA: Diagnosis not present

## 2017-05-12 DIAGNOSIS — M47816 Spondylosis without myelopathy or radiculopathy, lumbar region: Secondary | ICD-10-CM | POA: Diagnosis not present

## 2017-05-12 DIAGNOSIS — R531 Weakness: Secondary | ICD-10-CM | POA: Diagnosis not present

## 2017-05-14 ENCOUNTER — Other Ambulatory Visit: Payer: Self-pay | Admitting: Cardiology

## 2017-06-07 ENCOUNTER — Ambulatory Visit (INDEPENDENT_AMBULATORY_CARE_PROVIDER_SITE_OTHER): Payer: Medicare Other | Admitting: *Deleted

## 2017-06-07 ENCOUNTER — Telehealth: Payer: Self-pay | Admitting: Cardiology

## 2017-06-07 DIAGNOSIS — I495 Sick sinus syndrome: Secondary | ICD-10-CM

## 2017-06-07 NOTE — Telephone Encounter (Signed)
LMOVM reminding pt to send remote transmission.   

## 2017-06-11 ENCOUNTER — Encounter: Payer: Self-pay | Admitting: Cardiology

## 2017-06-11 LAB — CUP PACEART REMOTE DEVICE CHECK
Battery Remaining Longevity: 43 mo
Brady Statistic AP VS Percent: 1 %
Brady Statistic AS VS Percent: 1 %
Date Time Interrogation Session: 20181218180504
Implantable Lead Implant Date: 20070720
Implantable Lead Location: 753859
Implantable Lead Location: 753860
Implantable Pulse Generator Implant Date: 20140602
Lead Channel Impedance Value: 430 Ohm
Lead Channel Impedance Value: 960 Ohm
Lead Channel Sensing Intrinsic Amplitude: 1.6 mV
Lead Channel Sensing Intrinsic Amplitude: 12 mV
Lead Channel Setting Pacing Amplitude: 2 V
Lead Channel Setting Pacing Amplitude: 2.5 V
Lead Channel Setting Pacing Pulse Width: 0.5 ms
Lead Channel Setting Pacing Pulse Width: 1 ms
MDC IDC LEAD IMPLANT DT: 20070720
MDC IDC LEAD IMPLANT DT: 20140602
MDC IDC LEAD LOCATION: 753858
MDC IDC MSMT BATTERY REMAINING PERCENTAGE: 63 %
MDC IDC MSMT BATTERY VOLTAGE: 2.9 V
MDC IDC MSMT LEADCHNL LV PACING THRESHOLD AMPLITUDE: 2.75 V
MDC IDC MSMT LEADCHNL LV PACING THRESHOLD PULSEWIDTH: 1 ms
MDC IDC MSMT LEADCHNL RA PACING THRESHOLD AMPLITUDE: 0.5 V
MDC IDC MSMT LEADCHNL RA PACING THRESHOLD PULSEWIDTH: 0.4 ms
MDC IDC MSMT LEADCHNL RV IMPEDANCE VALUE: 590 Ohm
MDC IDC MSMT LEADCHNL RV PACING THRESHOLD AMPLITUDE: 0.75 V
MDC IDC MSMT LEADCHNL RV PACING THRESHOLD PULSEWIDTH: 0.5 ms
MDC IDC SET LEADCHNL LV PACING AMPLITUDE: 3.75 V
MDC IDC SET LEADCHNL RV SENSING SENSITIVITY: 5 mV
MDC IDC STAT BRADY AP VP PERCENT: 64 %
MDC IDC STAT BRADY AS VP PERCENT: 36 %
MDC IDC STAT BRADY RA PERCENT PACED: 63 %
Pulse Gen Model: 3242
Pulse Gen Serial Number: 2960456

## 2017-06-11 NOTE — Progress Notes (Signed)
Remote pacemaker transmission.   

## 2017-07-03 ENCOUNTER — Other Ambulatory Visit: Payer: Self-pay | Admitting: Cardiovascular Disease

## 2017-07-05 NOTE — Telephone Encounter (Signed)
Rx(s) sent to pharmacy electronically.  

## 2017-07-20 ENCOUNTER — Other Ambulatory Visit: Payer: Self-pay | Admitting: Cardiovascular Disease

## 2017-09-07 ENCOUNTER — Telehealth: Payer: Self-pay | Admitting: Cardiovascular Disease

## 2017-09-07 NOTE — Telephone Encounter (Signed)
Called patient's dispensing pharmacy [facility] to inquire about irbesartan. Per pharmacy staff, med went thru on insurance for $19.70/30  LM for patient to call back and notified them that per pharmacy, med is covered.   Of note, patient inquiring about irbesartan 150mg  and patient is on 300mg  per chart

## 2017-09-07 NOTE — Telephone Encounter (Signed)
[name] is calling because his insurance company does not cover the Medication Irbesartan 150 mg anymore . Wants to know if there is something else in which Dr.  Claiborne Billings can prescribe for her .  Please call   Thanks

## 2017-09-09 ENCOUNTER — Ambulatory Visit: Payer: Medicare Other | Admitting: *Deleted

## 2017-09-09 ENCOUNTER — Telehealth: Payer: Self-pay | Admitting: Cardiology

## 2017-09-09 NOTE — Telephone Encounter (Signed)
LMOVM reminding pt to send remote transmission.   

## 2017-09-10 ENCOUNTER — Encounter: Payer: Self-pay | Admitting: Cardiology

## 2017-10-05 ENCOUNTER — Telehealth: Payer: Self-pay | Admitting: Cardiology

## 2017-10-05 LAB — CUP PACEART REMOTE DEVICE CHECK
Date Time Interrogation Session: 20190416115057
Implantable Lead Implant Date: 20070720
Implantable Lead Implant Date: 20140602
Implantable Lead Location: 753858
Implantable Lead Location: 753859
Implantable Lead Model: 4092
Implantable Lead Model: 5076
MDC IDC LEAD IMPLANT DT: 20070720
MDC IDC LEAD LOCATION: 753860
MDC IDC PG IMPLANT DT: 20140602
MDC IDC PG SERIAL: 2960456

## 2017-10-05 NOTE — Telephone Encounter (Signed)
Follow UP:     Husband called and wanted to know if you received pt'she

## 2017-10-05 NOTE — Telephone Encounter (Signed)
Follow Up:    Husband called,he wants to know if you received pt's last transmission?

## 2017-10-05 NOTE — Telephone Encounter (Signed)
Close this encounter

## 2017-10-11 ENCOUNTER — Telehealth: Payer: Self-pay | Admitting: Cardiology

## 2017-10-11 NOTE — Telephone Encounter (Signed)
Pt husband returning you call.

## 2017-10-11 NOTE — Telephone Encounter (Signed)
Spoke with pts home health nurse who stated that pt cannon take call and that pts husband is away from the home, gave home health nurse device clinic direct phone number to call back regarding remote transmission. ( pt has [phone] but no cell adapter)

## 2017-10-11 NOTE — Telephone Encounter (Signed)
Spoke with pts husband pt has a Sport and exercise psychologist. Jude device with cell adapter plugged in, informed pts husband to unplug landline and use the cell adapter pts husband voiced understanding

## 2017-11-13 ENCOUNTER — Other Ambulatory Visit: Payer: Self-pay | Admitting: Cardiology

## 2017-11-16 ENCOUNTER — Other Ambulatory Visit: Payer: Self-pay | Admitting: Internal Medicine

## 2017-11-16 MED ORDER — FERROUS SULFATE 325 (65 FE) MG PO TABS: 325.0000 mg | ORAL_TABLET | Freq: Three times a day (TID) | ORAL | 4 refills | Status: DC

## 2017-12-09 ENCOUNTER — Telehealth: Payer: Self-pay | Admitting: Cardiology

## 2017-12-09 ENCOUNTER — Encounter: Payer: Medicare Other | Admitting: *Deleted

## 2017-12-09 NOTE — Telephone Encounter (Signed)
Confirmed remote transmission w/ pt husband.   

## 2017-12-10 ENCOUNTER — Encounter: Payer: Self-pay | Admitting: Cardiology

## 2018-01-05 DIAGNOSIS — R079 Chest pain, unspecified: Secondary | ICD-10-CM

## 2018-01-05 DIAGNOSIS — I4891 Unspecified atrial fibrillation: Secondary | ICD-10-CM | POA: Diagnosis not present

## 2018-02-04 ENCOUNTER — Encounter: Payer: Medicare Other | Admitting: *Deleted

## 2018-02-09 ENCOUNTER — Encounter: Payer: Self-pay | Admitting: Cardiology

## 2018-02-16 ENCOUNTER — Ambulatory Visit (INDEPENDENT_AMBULATORY_CARE_PROVIDER_SITE_OTHER): Payer: Medicare Other | Admitting: *Deleted

## 2018-02-16 DIAGNOSIS — I495 Sick sinus syndrome: Secondary | ICD-10-CM

## 2018-02-17 ENCOUNTER — Encounter: Payer: Self-pay | Admitting: Cardiology

## 2018-02-17 NOTE — Progress Notes (Signed)
Remote pacemaker transmission.   

## 2018-03-21 LAB — CUP PACEART REMOTE DEVICE CHECK
Implantable Lead Implant Date: 20070720
Implantable Lead Implant Date: 20070720
Implantable Lead Location: 753858
Implantable Lead Location: 753859
MDC IDC LEAD IMPLANT DT: 20140602
MDC IDC LEAD LOCATION: 753860
MDC IDC PG IMPLANT DT: 20140602
MDC IDC SESS DTM: 20190930112218
Pulse Gen Model: 3242
Pulse Gen Serial Number: 2960456

## 2018-04-13 ENCOUNTER — Encounter: Payer: Self-pay | Admitting: Internal Medicine

## 2018-04-13 ENCOUNTER — Ambulatory Visit (INDEPENDENT_AMBULATORY_CARE_PROVIDER_SITE_OTHER): Payer: Medicare Other | Admitting: Internal Medicine

## 2018-04-13 VITALS — BP 112/76 | HR 62 | Ht 63.0 in | Wt 130.0 lb

## 2018-04-13 DIAGNOSIS — I442 Atrioventricular block, complete: Secondary | ICD-10-CM

## 2018-04-13 DIAGNOSIS — I255 Ischemic cardiomyopathy: Secondary | ICD-10-CM | POA: Diagnosis not present

## 2018-04-13 DIAGNOSIS — Z950 Presence of cardiac pacemaker: Secondary | ICD-10-CM | POA: Diagnosis not present

## 2018-04-13 DIAGNOSIS — I5022 Chronic systolic (congestive) heart failure: Secondary | ICD-10-CM

## 2018-04-13 DIAGNOSIS — I480 Paroxysmal atrial fibrillation: Secondary | ICD-10-CM | POA: Diagnosis not present

## 2018-04-13 NOTE — Progress Notes (Signed)
Patient Care Team: Taylor Broker, MD as PCP - General (Family Medicine)   HPI  [name] is a 84 y.o. female Seen in followup for CRT-P. Upgrade accomplished 6/14.  Device was originally implanted for sick sinus syndrome now w complete Heart block and device dependent.  There was no  appreciable change in functional status  Hx of coronary artery disease with bypass graft and mitral valve repair in 2003 with prior stenting.   2012  Catht>>she  patent LIMA and patent vein graft to the ramus a  large and patent right   Catheterization 2013-December demonstrating 80-85% left main and 70% ostial LAD at the point of an old stent in the ramus occlusion and a large dominant right. Ejection fraction was 35-40% repeat catheterization 05/2013 demonstrated normalization of systolic function  Echo BJ/47 confirmed normal LV systolic function  She has afib and was on apixoban   She's had a history of recurrent falls with objective evidence of orthostasis. She also has memory issues and pain for which is treated with gabapentin. She's been diagnosed with spinal stenosis. The family is not clear as to why it was that the anticoagulation was discontinued.  I reviewed this with Dr. Leda Gauze.  He was agreeable with the resumption of anticoagulation.  She is increasingly less ambulatory.  She is assisted in walking.  She has had a couple falls over the last few years associate with a broken pelvis and a broken hip.  Her son thinks that this is what prompted the discontinuation of anticoagulation.  Biggest complaint is sweats at night and drenching.  They have been present for years and years but now can occur 3 or 4 times a night she denies fevers daytime chills anorexia     Past Medical History:  Diagnosis Date  . Anemia 1940's  . Anginal pain ([place])   . CHF (congestive heart failure) (HCC)    hx  . Chronic anticoagulation    apixaban  . Chronic systolic heart failure ([place]) 10/11/2012  .  Complete heart block ([place]) 10/11/2012  . Coronary artery disease   . Depression   . GERD (gastroesophageal reflux disease)   . H/O hiatal hernia   . History of blood transfusion    "related to uretheral OR & when 1st child was born"  . Hyperlipidemia   . Hypertension   . Hypothyroidism   . Ischemic cardiomyopathy- s/p CABG 10/11/2012   EF 39%  . Migraine    ~ qd for 11 months now (05/23/2014)  . OSA on CPAP 10/11/2012   No longer using CPAP due to worsening of Migraines. AHI was .45/hr at 9 cm water pressure. RDI was 0.4/hr. Patient was unable to reach REM sleep and maintain the supine position at optimal pressure. Patient was able to sleep for prolonged periods of time at 9 cm water pressure without suffering further from respiratory events.  [name] 10/11/2012   Updated to BiV  . Parkinson's disease ([place])   . Shortness of breath   . Stress incontinence    multiple injections of collagen  . Thrombocytopenia ([place])   . Weakness 10/06/12    Past Surgical History:  Procedure Laterality Date  . APPENDECTOMY    . BI-VENTRICULAR PACEMAKER REVISION (CRT-R)  11/21/2012   BIV   UPGRADE    WITH DR Caryl Comes  . BI-VENTRICULAR PACEMAKER UPGRADE N/A 10/31/2012   Procedure: BI-VENTRICULAR PACEMAKER UPGRADE;  Surgeon: Deboraha Sprang, MD;  Location: [facility] CATH LAB;  Service: Cardiovascular;  Laterality: N/A;  . BI-VENTRICULAR PACEMAKER UPGRADE N/A 11/21/2012   Procedure: BI-VENTRICULAR PACEMAKER UPGRADE;  Surgeon: Deboraha Sprang, MD;  Location: [facility] CATH LAB;  Service: Cardiovascular;  Laterality: N/A;  . CARDIAC CATHETERIZATION  05/2012   (1st cath 2012) This required recatheterization by Dr. Claiborne Billings and had a patent LIMA-LAD with distal diffuse disease, patent SVG-OM, 10% to 20% mid right coronary but an EF of about 35%, quantitatively iw was 38.9.  [name] CARDIAC CATHETERIZATION     "today;s makes 49" (05/23/2014)  . CARDIAC VALVE REPLACEMENT    . CATARACT EXTRACTION, BILATERAL Bilateral   .  CHOLECYSTECTOMY    . COLONOSCOPY WITH PROPOFOL N/A 09/22/2016   Procedure: COLONOSCOPY WITH PROPOFOL;  Surgeon: Mauri Pole, MD;  Location: [facility] ENDOSCOPY;  Service: Endoscopy;  Laterality: N/A;  . CORONARY ANGIOPLASTY WITH STENT PLACEMENT  2012   No significant CAD, patent but ectatic graft supplying the ramus, patent LIMA-LAD, and an EF of 50% at that time. She had an occluded stent to her circumflex from remote intervention. Required recatheterization 05/2012 by Dr. Claiborne Billings and had a patent LIMA-LAD with distal diffuse disease, patent SVG-OM, 10%-20% mid right coronary but an EF of about 35%, quantitatively it was 38.9.  . CORONARY ANGIOPLASTY WITH STENT PLACEMENT     "I have a tota of 4" (05/23/2014)  . CORONARY ARTERY BYPASS GRAFT  2003   With LIMA-LAD and vein graft to OM, and associated mitral valve repair with anuloplasty ring and quadrangular resection of the posterior leaflet, 24 mm Seguin ring by Dr. Roxy Manns.  . ESOPHAGOGASTRODUODENOSCOPY N/A 09/21/2016   Procedure: ESOPHAGOGASTRODUODENOSCOPY (EGD);  Surgeon: Mauri Pole, MD;  [facility] ENDOSCOPY;  Service: Endoscopy;  Laterality: N/A;  . INCONTINENCE SURGERY    . INSERT / REPLACE / REMOVE PACEMAKER  2007  . LEFT HEART CATHETERIZATION WITH CORONARY ANGIOGRAM N/A 06/07/2013   Procedure: LEFT HEART CATHETERIZATION WITH CORONARY ANGIOGRAM;  Surgeon: Lorretta Harp, MD;  Location: [facility] CATH LAB;  Service: Cardiovascular;  Laterality: N/A;  . LEFT HEART CATHETERIZATION WITH CORONARY/GRAFT ANGIOGRAM N/A 05/23/2012   Procedure: LEFT HEART CATHETERIZATION WITH Beatrix Fetters;  Surgeon: Troy Sine, MD;  Location: [facility] CATH LAB;  Service: Cardiovascular;  Laterality: N/A;  . LEFT HEART CATHETERIZATION WITH CORONARY/GRAFT ANGIOGRAM N/A 05/23/2014   Procedure: LEFT HEART CATHETERIZATION WITH Beatrix Fetters;  Surgeon: Wellington Hampshire, MD;  Location: [facility] CATH LAB;  Service: Cardiovascular;  Laterality: N/A;  . lexiscan  myocardial stress test  08/20/11   The EF = 34%. This is a low risk scan. There is a new fix abnormality in the distal LAD artery territory. Although no reversibility is seen, high grade obstruction with resting ischemia cannot be excluded.  [name] PACEMAKER GENERATOR CHANGE    . US ECHOCARDIOGRAPHY  04/13/12   EF - 25% to 35% with intraventricular dyssynchrony, RVSP of 46, moderate TR, and mild to moderate AI. She had a mild increased mitral valve gradient of 5 mmHg with minimal MR.   [name] VAGINAL HYSTERECTOMY      Current Outpatient Medications  Medication Sig Dispense Refill  . buPROPion (WELLBUTRIN XL) 150 MG 24 hr tablet Take 1 tablet by mouth daily.    . busPIRone (BUSPAR) 10 MG tablet Take 10 mg by mouth 2 (two) times daily.    . carbidopa-levodopa (SINEMET IR) 25-100 MG tablet Take 1 tablet by mouth 3 (three) times daily.    [name] donepezil (ARICEPT) 10 MG tablet Take 10 mg by mouth at  bedtime.    . folic acid (FOLVITE) 1 MG tablet Take 1 tablet by mouth daily.    . furosemide (LASIX) 20 MG tablet Take 1 tablet (20 mg total) by mouth daily. PLEASE CONTACT OFFICE FOR ADDITIONAL REFILLS 15 tablet 0  . irbesartan (AVAPRO) 300 MG tablet TAKE ONE (1) TABLET ONCE DAILY 30 tablet 8  . nebivolol (BYSTOLIC) 5 MG tablet Take 1 tablet (5 mg total) by mouth at bedtime. Please schedule an appointment with Dr Claiborne Billings for future refills. 30 tablet 9  . NITROSTAT 0.4 MG SL tablet PLACE 1 TAB UNDER TONGUE EVERY 5 MINUTES UP TO 3 DOSES FOR CHEST PAIN 25 tablet 2  . pantoprazole (PROTONIX) 20 MG tablet Take 1 tablet by mouth daily.    [name] PARoxetine (PAXIL) 30 MG tablet Take 30 mg by mouth every evening.     No current facility-administered medications for this visit.     Allergies  Allergen Reactions  . Buprenorphine Hcl Other (See Comments)    "jerking"  . Dilaudid [Hydromorphone Hcl] Other (See Comments)    "jerking"  . Morphine And Related Other (See Comments)    "jerking"   . Hydromorphone     unknown    . Morphine Other (See Comments)    unknown  . Ramipril Other (See Comments)    Unknown   . Sulfur Other (See Comments)    unknown  . Sulfa Antibiotics Other (See Comments)    unknown  . Sulfasalazine Other (See Comments)    unknown    Review of Systems negative except from HPI and PMH  Physical Exam BP 112/76   Ht 5\' 3"  (1.6 m)   Wt 130 lb (59 kg)   SpO2 93%   BMI 23.03 kg/m  Well developed and nourished in no acute distress sitting in a wheel chair  HENT normal Neck supple with JVP-flat Clear Regular rate and rhythm, no murmurs or gallops Abd-soft with active BS No Clubbing cyanosis edema Skin-warm and dry A & Oriented  Grossly normal sensory and motor function    ECG demonstrates AV pacing with an upright QRS V1 negative QRS lead I    Assessment and  Plan  Coronary disease with prior bypass surgery  Atrial fibrillation detected on her device  Sick sinus syndrome  Pacemaker-St. Jude The patient's device was interrogated.  The information was reviewed. No changes were made in the programming.     Congestive heart failure-chronic-diastolic  Falls--Orthostatic hypotension\  Night Sweats-- drenching   Without symptoms of ischemia  Euvolemic continue current meds  Ongoing issues with atrial fibrillation.  I have discussed the situation with Dr. Leda Gauze.  He is open to her being put back on anticoagulation.  Her son reports that was related to falls.  We have reviewed the risks of stroke and the attendant benefits of anticoagulation.  They will review and discuss it is a family with neurology  We reviewed a little bit all night sweats.  With her implanted device, the absence of systemic symptoms make me think that it is unlikely to be a device infection however, this will need to be considered.  The more likely culprit I suspect however is her long-standing SSRI therapy she is advised to follow-up with her PCP for the evaluation

## 2018-04-13 NOTE — Patient Instructions (Signed)
Medication Instructions:  Your physician recommends that you continue on your current medications as directed. Please refer to the Current Medication list given to you today.  Labwork: None ordered.  Testing/Procedures: None ordered.  Follow-Up:  Your physician recommends that you schedule a follow-up appointment in:   One year with Dr Caryl Comes  Remote monitoring is used to monitor your Pacemaker of ICD from home. This monitoring reduces the number of office visits required to check your device to one time per year. It allows [place] to keep an eye on the functioning of your device to ensure it is working properly. You are scheduled for a device check from home on 05/18/2018. You may send your transmission at any time that day. If you have a wireless device, the transmission will be sent automatically. After your physician reviews your transmission, you will receive a postcard with your next transmission date.    Any Other Special Instructions Will Be Listed Below (If Applicable).     If you need a refill on your cardiac medications before your next appointment, please call your pharmacy.

## 2018-05-18 ENCOUNTER — Telehealth: Payer: Self-pay | Admitting: Cardiology

## 2018-05-18 ENCOUNTER — Ambulatory Visit (INDEPENDENT_AMBULATORY_CARE_PROVIDER_SITE_OTHER): Payer: Medicare Other

## 2018-05-18 DIAGNOSIS — I442 Atrioventricular block, complete: Secondary | ICD-10-CM

## 2018-05-18 DIAGNOSIS — I255 Ischemic cardiomyopathy: Secondary | ICD-10-CM | POA: Diagnosis not present

## 2018-05-18 NOTE — Telephone Encounter (Signed)
Confirmed remote transmission w/ pt caregiver.   

## 2018-05-21 LAB — CUP PACEART INCLINIC DEVICE CHECK
Battery Voltage: 2.81 V
Brady Statistic RA Percent Paced: 70 %
Brady Statistic RV Percent Paced: 99.92 %
Date Time Interrogation Session: 20191023165040
Implantable Lead Implant Date: 20070720
Implantable Lead Location: 753860
Implantable Lead Model: 4092
Implantable Lead Model: 5076
Implantable Pulse Generator Implant Date: 20140602
Lead Channel Impedance Value: 1050 Ohm
Lead Channel Impedance Value: 625 Ohm
Lead Channel Pacing Threshold Amplitude: 0.75 V
Lead Channel Pacing Threshold Amplitude: 1.5 V
Lead Channel Pacing Threshold Pulse Width: 0.4 ms
Lead Channel Pacing Threshold Pulse Width: 0.5 ms
Lead Channel Setting Pacing Amplitude: 2 V
Lead Channel Setting Pacing Amplitude: 2.5 V
Lead Channel Setting Pacing Pulse Width: 0.5 ms
Lead Channel Setting Pacing Pulse Width: 1 ms
Lead Channel Setting Sensing Sensitivity: 5 mV
MDC IDC LEAD IMPLANT DT: 20070720
MDC IDC LEAD IMPLANT DT: 20140602
MDC IDC LEAD LOCATION: 753858
MDC IDC LEAD LOCATION: 753859
MDC IDC MSMT BATTERY REMAINING LONGEVITY: 19 mo
MDC IDC MSMT LEADCHNL LV PACING THRESHOLD PULSEWIDTH: 1 ms
MDC IDC MSMT LEADCHNL RA IMPEDANCE VALUE: 450 Ohm
MDC IDC MSMT LEADCHNL RA PACING THRESHOLD AMPLITUDE: 0.5 V
MDC IDC MSMT LEADCHNL RA SENSING INTR AMPL: 1.9 mV
MDC IDC PG SERIAL: 2960456
MDC IDC SET LEADCHNL LV PACING AMPLITUDE: 2.5 V

## 2018-05-23 NOTE — Progress Notes (Signed)
Remote pacemaker transmission.   

## 2018-05-26 ENCOUNTER — Encounter: Payer: Self-pay | Admitting: Cardiology

## 2018-07-08 LAB — CUP PACEART REMOTE DEVICE CHECK
Battery Remaining Longevity: 14 mo
Battery Remaining Percentage: 24 %
Brady Statistic AP VP Percent: 78 %
Brady Statistic AP VS Percent: 1 %
Brady Statistic AS VS Percent: 1 %
Implantable Lead Implant Date: 20070720
Implantable Lead Implant Date: 20140602
Implantable Lead Location: 753858
Implantable Lead Location: 753860
Implantable Lead Model: 5076
Lead Channel Impedance Value: 430 Ohm
Lead Channel Impedance Value: 890 Ohm
Lead Channel Pacing Threshold Amplitude: 0.75 V
Lead Channel Pacing Threshold Amplitude: 2.25 V
Lead Channel Pacing Threshold Pulse Width: 0.5 ms
Lead Channel Pacing Threshold Pulse Width: 1 ms
Lead Channel Sensing Intrinsic Amplitude: 1.7 mV
Lead Channel Sensing Intrinsic Amplitude: 12 mV
Lead Channel Setting Pacing Amplitude: 2 V
Lead Channel Setting Pacing Amplitude: 2.5 V
Lead Channel Setting Pacing Amplitude: 3.25 V
Lead Channel Setting Pacing Pulse Width: 0.5 ms
Lead Channel Setting Sensing Sensitivity: 5 mV
MDC IDC LEAD IMPLANT DT: 20070720
MDC IDC LEAD LOCATION: 753859
MDC IDC MSMT BATTERY VOLTAGE: 2.8 V
MDC IDC MSMT LEADCHNL RA PACING THRESHOLD AMPLITUDE: 0.5 V
MDC IDC MSMT LEADCHNL RA PACING THRESHOLD PULSEWIDTH: 0.4 ms
MDC IDC MSMT LEADCHNL RV IMPEDANCE VALUE: 540 Ohm
MDC IDC PG IMPLANT DT: 20140602
MDC IDC PG SERIAL: 2960456
MDC IDC SESS DTM: 20191128003908
MDC IDC SET LEADCHNL LV PACING PULSEWIDTH: 1 ms
MDC IDC STAT BRADY AS VP PERCENT: 22 %
MDC IDC STAT BRADY RA PERCENT PACED: 72 %
Pulse Gen Model: 3242

## 2018-08-04 DIAGNOSIS — I10 Essential (primary) hypertension: Secondary | ICD-10-CM

## 2018-08-04 DIAGNOSIS — I2510 Atherosclerotic heart disease of native coronary artery without angina pectoris: Secondary | ICD-10-CM

## 2018-08-04 DIAGNOSIS — R0789 Other chest pain: Secondary | ICD-10-CM

## 2018-08-04 DIAGNOSIS — G20 Parkinson's disease: Secondary | ICD-10-CM | POA: Diagnosis not present

## 2018-08-04 DIAGNOSIS — G309 Alzheimer's disease, unspecified: Secondary | ICD-10-CM | POA: Diagnosis not present

## 2018-08-04 DIAGNOSIS — Z950 Presence of cardiac pacemaker: Secondary | ICD-10-CM

## 2018-08-04 DIAGNOSIS — R1310 Dysphagia, unspecified: Secondary | ICD-10-CM

## 2018-08-04 DIAGNOSIS — R079 Chest pain, unspecified: Secondary | ICD-10-CM | POA: Diagnosis not present

## 2018-08-17 ENCOUNTER — Ambulatory Visit (INDEPENDENT_AMBULATORY_CARE_PROVIDER_SITE_OTHER): Payer: Medicare Other | Admitting: *Deleted

## 2018-08-17 DIAGNOSIS — I495 Sick sinus syndrome: Secondary | ICD-10-CM

## 2018-08-17 DIAGNOSIS — I442 Atrioventricular block, complete: Secondary | ICD-10-CM | POA: Diagnosis not present

## 2018-08-18 ENCOUNTER — Telehealth: Payer: Self-pay | Admitting: Internal Medicine

## 2018-08-18 NOTE — Telephone Encounter (Signed)
New Message   PTs husband is calling because his wife was sick and missed her transmission. He said he will send the transmission tonight.  Please call him if their are questions or something he needs to do

## 2018-08-19 LAB — CUP PACEART REMOTE DEVICE CHECK
Battery Remaining Longevity: 12 mo
Battery Remaining Percentage: 18 %
Battery Voltage: 2.77 V
Brady Statistic AP VP Percent: 85 %
Brady Statistic AS VS Percent: 1 %
Brady Statistic RA Percent Paced: 78 %
Date Time Interrogation Session: 20200227032019
Implantable Lead Implant Date: 20070720
Implantable Lead Implant Date: 20140602
Implantable Lead Location: 753860
Implantable Lead Model: 5076
Lead Channel Impedance Value: 460 Ohm
Lead Channel Pacing Threshold Amplitude: 0.5 V
Lead Channel Pacing Threshold Amplitude: 0.75 V
Lead Channel Pacing Threshold Pulse Width: 0.4 ms
Lead Channel Pacing Threshold Pulse Width: 0.5 ms
Lead Channel Sensing Intrinsic Amplitude: 2.1 mV
Lead Channel Setting Pacing Amplitude: 2.5 V
Lead Channel Setting Pacing Amplitude: 2.875
Lead Channel Setting Pacing Pulse Width: 0.5 ms
Lead Channel Setting Pacing Pulse Width: 1 ms
MDC IDC LEAD IMPLANT DT: 20070720
MDC IDC LEAD LOCATION: 753858
MDC IDC LEAD LOCATION: 753859
MDC IDC MSMT LEADCHNL LV IMPEDANCE VALUE: 960 Ohm
MDC IDC MSMT LEADCHNL LV PACING THRESHOLD AMPLITUDE: 1.875 V
MDC IDC MSMT LEADCHNL LV PACING THRESHOLD PULSEWIDTH: 1 ms
MDC IDC MSMT LEADCHNL RV IMPEDANCE VALUE: 650 Ohm
MDC IDC MSMT LEADCHNL RV SENSING INTR AMPL: 12 mV
MDC IDC PG IMPLANT DT: 20140602
MDC IDC PG SERIAL: 2960456
MDC IDC SET LEADCHNL RA PACING AMPLITUDE: 2 V
MDC IDC SET LEADCHNL RV SENSING SENSITIVITY: 5 mV
MDC IDC STAT BRADY AP VS PERCENT: 1 %
MDC IDC STAT BRADY AS VP PERCENT: 15 %

## 2018-08-19 NOTE — Telephone Encounter (Signed)
Transmission received on 08/17/18.

## 2018-08-24 ENCOUNTER — Encounter: Payer: Self-pay | Admitting: Cardiology

## 2018-08-24 NOTE — Progress Notes (Signed)
Remote pacemaker transmission.   

## 2018-09-28 ENCOUNTER — Telehealth: Payer: Self-pay | Admitting: *Deleted

## 2018-09-28 NOTE — Telephone Encounter (Signed)
Received triage alert for increased AT/AF burden. Per Dr. Olin Pia OV note from 03/2018, patient's family was planning to discuss restarting [facility] with neurology. Per notes in Care Everywhere, pt declined to schedule OV with Dr. Verdene Rio, requested provider/practice change. Will contact pt/family to see if pt has established care with a new neurologist.  LM with caregiver requesting call back from pt's husband.

## 2018-09-28 NOTE — Telephone Encounter (Signed)
Pt husband returning call

## 2018-09-28 NOTE — Telephone Encounter (Signed)
Per husband, pt transferred to neurologist Dr. Berdine Addison in [place]. She was supposed to have an appointment with him this week, but canceled due to Covid. He will check his notes about pt's current medications and whether they discussed restarting [place] at last f/u with Dr. Berdine Addison.  Pt has been progressively weaker over the past 1-2 months. Taking more naps and doesn't want to do as much. No specific cardiac complaints that her husband is aware of. Advised I will send this information to Dr. Caryl Comes after clarifying medication list. Pt's husband is agreeable and plans to call me back tomorrow.

## 2018-09-29 NOTE — Telephone Encounter (Signed)
Spoke with pt's husband. He reports she is not on a blood thinner, but misplaced her medicine list again after finding it earlier today. He is unsure when she last saw Dr. Berdine Addison, but does not recall discussing [place]. He reports her next appointment with him is postponed indefinitely due to Covid.  Pt's husband called EMS earlier today as pt has been more weak than usual, maybe a little gray around the mouth. Per notes EMS left him with, BP 162/70, HR 68, O2 sat 97%, temp 97.2 F. They also did an EKG, which pt's husband will hold onto. It was ultimately decided that pt should remain at home. He feels like she is back at baseline now.  Advised I will send this message to Dr. Caryl Comes and Rolanda Lundborg, RN, for further recommendations regarding [place] and increased AF burden. Longest recent episode was 11.5hrs on 09/27/18, followed by brief return to SR, then another 9hr 47min episode.  Most recent AF episode:

## 2018-10-03 NOTE — Telephone Encounter (Signed)
No clearly right answer re anticoagulation   Based on ASSERT, the 12 hr episodes are relatively low risk but the events are getting longer  May be appropriate for me to discuss wi husband anticoagulation ?  Thanks

## 2018-10-04 ENCOUNTER — Telehealth: Payer: Self-pay | Admitting: Internal Medicine

## 2018-10-04 NOTE — Telephone Encounter (Signed)
New message:    Patient son calling concerning the patient up coming appt.

## 2018-10-04 NOTE — Telephone Encounter (Signed)
Pt's husband agrees with virtual visit with Dr Caryl Comes. He will be contacting his children to help with a virtual call.   He has verbally consented to the virtual visit and to bill insurance for the visit.

## 2018-10-04 NOTE — Telephone Encounter (Signed)
Pt's son would like to know if his mother's visit could be a conference call. His father has limited technology knowledge and he lives 1 1/2 hours away. I advised him this would be fine. We will call his son Shanon Brow at [phone] as well as the pt at the time of the call.

## 2018-10-06 ENCOUNTER — Telehealth (INDEPENDENT_AMBULATORY_CARE_PROVIDER_SITE_OTHER): Payer: Medicare Other | Admitting: Internal Medicine

## 2018-10-06 ENCOUNTER — Other Ambulatory Visit: Payer: Self-pay

## 2018-10-06 VITALS — BP 181/81 | HR 79 | Ht 63.0 in

## 2018-10-06 DIAGNOSIS — I480 Paroxysmal atrial fibrillation: Secondary | ICD-10-CM

## 2018-10-06 DIAGNOSIS — I5022 Chronic systolic (congestive) heart failure: Secondary | ICD-10-CM

## 2018-10-06 DIAGNOSIS — I442 Atrioventricular block, complete: Secondary | ICD-10-CM

## 2018-10-06 DIAGNOSIS — Z950 Presence of cardiac pacemaker: Secondary | ICD-10-CM

## 2018-10-06 MED ORDER — APIXABAN 2.5 MG PO TABS: 2.5000 mg | ORAL_TABLET | Freq: Two times a day (BID) | ORAL | 3 refills | Status: DC

## 2018-10-06 NOTE — Progress Notes (Signed)
Electrophysiology TeleHealth Note   Due to national recommendations of social distancing due to COVID 19, an audio/video telehealth visit is felt to be most appropriate for this patient at this time.   The patient did not have access to video technology/had technical difficulties with video requiring transitioning to audio format only (telephone).  All issues noted in this document were discussed and addressed.  No physical exam could be performed with this format.  Please refer to the patient's chart for her  consent to telehealth for [facility]. See MyChart message from today for the patient's consent to telehealth for [facility].   Date:  10/06/2018   ID:  [name], DOB 1931-03-21, MRN [id number]  Location: patient's home  Provider location: [address] [place]  Evaluation Performed: Follow-up visit  PCP:  Myrlene Broker, MD  Cardiologist:    tk Electrophysiologist:  SK   Chief Complaint:  afib  History of Present Illness:    [name] is a 85 y.o. female who presents via audio/video conferencing for a telehealth visit today.  Since last being seen in our clinic, the family  reports progressive sedentary and dementia   Seen in followup for CRT-P. Upgrade accomplished 6/14.  Device was originally implanted for sick sinus syndrome now w complete Heart block and device dependent.    Hx of coronary artery disease with bypass graft and mitral valve repair in 2003 with prior stenting.   2012 Cath> patent LIMA and patent vein graft to the ramus a  large and patent right   Catheterization 2013-December demonstrating 80-85% left main and 70% ostial LAD at the point of an old stent in the ramus occlusion and a large dominant right. Ejection fraction was 35-40% repeat catheterization 05/2013 demonstrated normalization of systolic function  Echo ER/15 confirmed normal LV systolic function  She has increasing afib; not on anticoagulation    Largely nonsendentary 18-20h;  No interval falls as is less mobile.  Also dx parkinsons  Date Cr K Hgb  4/19 1.34 4.5 13.4             The patient denies symptoms of fevers, chills, cough, or new SOB worrisome for COVID 19.    Past Medical History:  Diagnosis Date  . Anemia 1940's  . Anginal pain ([place])   . CHF (congestive heart failure) (HCC)    hx  . Chronic anticoagulation    apixaban  . Chronic systolic heart failure ([place]) 10/11/2012  . Complete heart block ([place]) 10/11/2012  . Coronary artery disease   . Depression   . GERD (gastroesophageal reflux disease)   . H/O hiatal hernia   . History of blood transfusion    "related to uretheral OR & when 1st child was born"  . Hyperlipidemia   . Hypertension   . Hypothyroidism   . Ischemic cardiomyopathy- s/p CABG 10/11/2012   EF 39%  . Migraine    ~ qd for 11 months now (05/23/2014)  . OSA on CPAP 10/11/2012   No longer using CPAP due to worsening of Migraines. AHI was .45/hr at 9 cm water pressure. RDI was 0.4/hr. Patient was unable to reach REM sleep and maintain the supine position at optimal pressure. Patient was able to sleep for prolonged periods of time at 9 cm water pressure without suffering further from respiratory events.  [name] 10/11/2012   Updated to BiV  . Parkinson's disease ([place])   . Shortness of breath   .  Stress incontinence    multiple injections of collagen  . Thrombocytopenia ([place])   . Weakness 10/06/12    Past Surgical History:  Procedure Laterality Date  . APPENDECTOMY    . BI-VENTRICULAR PACEMAKER REVISION (CRT-R)  11/21/2012   BIV   UPGRADE    WITH DR Caryl Comes  . BI-VENTRICULAR PACEMAKER UPGRADE N/A 10/31/2012   Procedure: BI-VENTRICULAR PACEMAKER UPGRADE;  Surgeon: Deboraha Sprang, MD;  Location: [facility] CATH LAB;  Service: Cardiovascular;  Laterality: N/A;  . BI-VENTRICULAR PACEMAKER UPGRADE N/A 11/21/2012   Procedure: BI-VENTRICULAR PACEMAKER UPGRADE;  Surgeon: Deboraha Sprang, MD;  Location:  [facility] CATH LAB;  Service: Cardiovascular;  Laterality: N/A;  . CARDIAC CATHETERIZATION  05/2012   (1st cath 2012) This required recatheterization by Dr. Claiborne Billings and had a patent LIMA-LAD with distal diffuse disease, patent SVG-OM, 10% to 20% mid right coronary but an EF of about 35%, quantitatively iw was 38.9.  [name] CARDIAC CATHETERIZATION     "today;s makes 36" (05/23/2014)  . CARDIAC VALVE REPLACEMENT    . CATARACT EXTRACTION, BILATERAL Bilateral   . CHOLECYSTECTOMY    . COLONOSCOPY WITH PROPOFOL N/A 09/22/2016   Procedure: COLONOSCOPY WITH PROPOFOL;  Surgeon: Mauri Pole, MD;  Location: [facility] ENDOSCOPY;  Service: Endoscopy;  Laterality: N/A;  . CORONARY ANGIOPLASTY WITH STENT PLACEMENT  2012   No significant CAD, patent but ectatic graft supplying the ramus, patent LIMA-LAD, and an EF of 50% at that time. She had an occluded stent to her circumflex from remote intervention. Required recatheterization 05/2012 by Dr. Claiborne Billings and had a patent LIMA-LAD with distal diffuse disease, patent SVG-OM, 10%-20% mid right coronary but an EF of about 35%, quantitatively it was 38.9.  . CORONARY ANGIOPLASTY WITH STENT PLACEMENT     "I have a tota of 4" (05/23/2014)  . CORONARY ARTERY BYPASS GRAFT  2003   With LIMA-LAD and vein graft to OM, and associated mitral valve repair with anuloplasty ring and quadrangular resection of the posterior leaflet, 24 mm Seguin ring by Dr. Roxy Manns.  . ESOPHAGOGASTRODUODENOSCOPY N/A 09/21/2016   Procedure: ESOPHAGOGASTRODUODENOSCOPY (EGD);  Surgeon: Mauri Pole, MD;  Location: [facility]COPY;  Service: Endoscopy;  Laterality: N/A;  . INCONTINENCE SURGERY    . INSERT / REPLACE / REMOVE PACEMAKER  2007  . LEFT HEART CATHETERIZATION WITH CORONARY ANGIOGRAM N/A 06/07/2013   Procedure: LEFT HEART CATHETERIZATION WITH CORONARY ANGIOGRAM;  Surgeon: Lorretta Harp, MD;  Location: [facility] CATH LAB;  Service: Cardiovascular;  Laterality: N/A;  . LEFT HEART CATHETERIZATION WITH CORONARY/GRAFT  ANGIOGRAM N/A 05/23/2012   Procedure: LEFT HEART CATHETERIZATION WITH Beatrix Fetters;  Surgeon: Troy Sine, MD;  Location: [facility] CATH LAB;  Service: Cardiovascular;  Laterality: N/A;  . LEFT HEART CATHETERIZATION WITH CORONARY/GRAFT ANGIOGRAM N/A 05/23/2014   Procedure: LEFT HEART CATHETERIZATION WITH Beatrix Fetters;  Surgeon: Wellington Hampshire, MD;  Location: [facility] CATH LAB;  Service: Cardiovascular;  Laterality: N/A;  . lexiscan myocardial stress test  08/20/11   The EF = 34%. This is a low risk scan. There is a new fix abnormality in the distal LAD artery territory. Although no reversibility is seen, high grade obstruction with resting ischemia cannot be excluded.  [name] PACEMAKER GENERATOR CHANGE    . US ECHOCARDIOGRAPHY  04/13/12   EF - 25% to 35% with intraventricular dyssynchrony, RVSP of 46, moderate TR, and mild to moderate AI. She had a mild increased mitral valve gradient of 5 mmHg with minimal MR.   [name] VAGINAL HYSTERECTOMY  Current Outpatient Medications  Medication Sig Dispense Refill  . busPIRone (BUSPAR) 10 MG tablet Take 10 mg by mouth 2 (two) times daily.    . carbidopa-levodopa (SINEMET IR) 25-100 MG tablet Take 1 tablet by mouth 3 (three) times daily.    [name] donepezil (ARICEPT) 10 MG tablet Take 10 mg by mouth at bedtime.    . folic acid (FOLVITE) 1 MG tablet Take 1 tablet by mouth daily.    . furosemide (LASIX) 20 MG tablet Take 1 tablet (20 mg total) by mouth daily. PLEASE CONTACT OFFICE FOR ADDITIONAL REFILLS 15 tablet 0  . irbesartan (AVAPRO) 300 MG tablet TAKE ONE (1) TABLET ONCE DAILY 30 tablet 8  . meloxicam (MOBIC) 7.5 MG tablet Take 7.5 mg by mouth daily.    . nebivolol (BYSTOLIC) 5 MG tablet Take 1 tablet (5 mg total) by mouth at bedtime. Please schedule an appointment with Dr Claiborne Billings for future refills. 30 tablet 9  . NITROSTAT 0.4 MG SL tablet PLACE 1 TAB UNDER TONGUE EVERY 5 MINUTES UP TO 3 DOSES FOR CHEST PAIN 25 tablet 2  . pantoprazole (PROTONIX) 20  MG tablet Take 1 tablet by mouth daily.    [name] PARoxetine (PAXIL) 30 MG tablet Take 30 mg by mouth every evening.    [name] buPROPion (WELLBUTRIN XL) 150 MG 24 hr tablet Take 1 tablet by mouth daily.     No current facility-administered medications for this visit.     Allergies:   Buprenorphine hcl; Dilaudid [hydromorphone hcl]; Morphine and related; Hydromorphone; Morphine; Ramipril; Sulfur; Sulfa antibiotics; and Sulfasalazine   Social History:  The patient  reports that she has never smoked. She has never used smokeless tobacco. She reports that she does not drink alcohol or use drugs.   Family History:  The patient's   family history includes Cancer in her father.   ROS:  Please see the history of present illness.   All other systems are personally reviewed and negative.    Exam:    Vital Signs:  BP (!) 181/81   Pulse 79   Ht 5\' 3"  (1.6 m)   BMI 23.03 kg/m        Labs/Other Tests and Data Reviewed:    Recent Labs: No results found for requested labs within last 8760 hours.   Wt Readings from Last 3 Encounters:  04/13/18 130 lb (59 kg)  04/08/17 120 lb 3.2 oz (54.5 kg)  09/21/16 126 lb 12.2 oz (57.5 kg)     Other studies personally reviewed: Additional studies/ records that were reviewed today include: *labs As above  R      Last device remote is reviewed from [place] PDF dated 2/20* which reveals normal device function,   Arrhythmias Atrial fib at total burden 1.2% Interval transmission 09/28/2018 demonstrated increased atrial fibrillation burden  Battery approaching ERI < 12 months  ASSESSMENT & PLAN:   Coronary disease with prior bypass surgery  Atrial fibrillation detected on her device  Sick sinus syndrome  Pacemaker-[employer] The patient's device was interrogated.  The information was reviewed. No changes were made in the programming.     Congestive heart failure-chronic-diastolic  Falls--Orthostatic hypotension  Hypertension  End of Life Discussion     Long discussion re anticoagulation and risk and benefit  With much less mobility and family is agreeable to begin [place] DNR and MOST   COVID 19 screen The patient denies symptoms of COVID 19 at this time.  The importance of social distancing  was discussed today.  Follow-up:  68 m Next remote: *As Scheduled   Current medicines are reviewed at length with the patient today.   The patient does not have concerns regarding her medicines.  The following changes were made today:  As above   Labs/ tests ordered today include: *  No orders of the defined types were placed in this encounter.   CBC and BMET per PCP in 2 m  Patient Risk:  after full review of this patients clinical status, I feel that they are at moderate risk at this time.  Today, I have spent 45  minutes with the patient with telehealth technology discussing the above.  Signed, Virl Axe, MD  10/06/2018 4:10 PM     [address] [place] [address] [phone] (office) [phone] (fax)

## 2018-10-06 NOTE — Patient Instructions (Signed)
Spoke with [name], pt's home RN regarding medication changes. [name] was not available. Dr Caryl Comes would like her to discontinue Meloxicam when beginning Eliquis. She will need a repeat BMP and CBC drawn by her PCP in 2-3 weeks. Pt's home RN has taken instructions and will instruct [name] to call back with any questions.

## 2018-10-26 ENCOUNTER — Telehealth: Payer: Self-pay | Admitting: Internal Medicine

## 2018-10-26 NOTE — Telephone Encounter (Signed)
New Message   Hildred Alamin calling to report stat labs ordered by Dr. Caryl Comes please call her back.  She also will fax the results over.

## 2018-10-26 NOTE — Telephone Encounter (Signed)
Spoke with pt's spouse, Juanda Crumble. I informed him of Alexus's Hbg of 12.1. He understands to keep pt off Eliquis and let us know if she continues to have bleeding abnormalities and/or low blood pressures. He has agreed with this plan and has no additional questions.

## 2018-10-26 NOTE — Telephone Encounter (Signed)
Called pt's son and discussed Dr Olin Pia recommendation. Pt is currently at [facility] having a Stat CBC and BMP drawn per her PCP, Dr Unk Lightning.   I advise pt's son and his wife it was Dr. Olin Pia recommendation that she go to the ED for further evaluation regardless of the lab draw. Pt's son states he does not feel comfortable bringing her to the ED amongst the [place] outbreak. I told him that I understood, however [facility] was currently COVID free and 100 % masked in order to protect our vulnerable populations.   Pt's son and his wife state they are not sure she needs to go to the ED as she has not had any bloody stool or urine today. He reports her SBP standing was in the 90's, 130's when seated.  I communicated to pt's son that I can only give them the recommendation Dr Caryl Comes has stated. I educated them that while she may not have outwardly signs of bleeding, this does not mean there may not be bleeding somewhere else, which cannot be seen. He verbalized understanding.  I advised pt's son to discuss this with his family. I re-endorsed  Dr Olin Pia recommendations, which they may use to make their decision.   I will contact pt's PCP tomorrow for lab values as well.

## 2018-10-26 NOTE — Telephone Encounter (Signed)
Spoke with Meredith Mody, RN at [facility] on call regarding pt's recent lab work. Unfortunately, she does not have access to pt's records. She will be contacting Dr Unk Lightning to discuss recent Hbg and urgent lab call from his office before they closed. Pt's labs have not resulted in [place] as of yet.

## 2018-10-26 NOTE — Telephone Encounter (Signed)
Paged the on call nurse for assistance.

## 2018-10-26 NOTE — Telephone Encounter (Signed)
Tried to call number provided in note. Recording stated office closed. Will route to Dr. Caryl Comes and his nurse.

## 2018-10-26 NOTE — Telephone Encounter (Signed)
Spoke with pt's son regarding his mothers nose bleeds, hematuria, and hematochezia. He states his mother is also extremely weak and does not know if this is caused by her Eliquis start.   I have advised him we will need to get a BMP and CBC stat. He has asked I call her PCP to arrange since they live in [place]. I attempted to call Dr Unk Lightning office, but they are out to lunch. I will attempt again in an hour.   Pt son was asked to obtain a BP of [name]. I told pt's son I would discuss this with Dr Caryl Comes this afternoon and call him back for recommendation.

## 2018-10-26 NOTE — Telephone Encounter (Signed)
Per Meredith Mody, RN and Dr Unk Lightning, pt's Hbg was 12.1 and her remaining labs were normal.

## 2018-10-26 NOTE — Telephone Encounter (Signed)
New message   Pt c/o medication issue:  1. Name of Medication: apixaban (ELIQUIS) 2.5 MG TABS tablet  2. How are you currently taking this medication (dosage and times per day)? 2 times daily  3. Are you having a reaction (difficulty breathing--STAT)? no  4. What is your medication issue? Per the patient's son the patient is having nose bleeds and blood in stool and urine. He wants to know if the dosage needs to be changed. Please call to discuss.

## 2018-10-27 ENCOUNTER — Telehealth: Payer: Self-pay | Admitting: Internal Medicine

## 2018-10-27 NOTE — Telephone Encounter (Signed)
Spoke with pt's son regarding his mother's lab results yesterday. I advised him that per the on call RN at [facility], her Hbg was 12.1. Dr Caryl Comes wanted her to continue off her Eliquis and continue to monitor her urine and stool for blood. Dr Caryl Comes would reassess her need for Eliquis at a later date.   He verbalized understanding and had no additional questions.

## 2018-10-27 NOTE — Telephone Encounter (Signed)
Noted  

## 2018-10-27 NOTE — Telephone Encounter (Signed)
New Message ° ° ° ° °Patient calling for lab results. °

## 2018-10-31 NOTE — Telephone Encounter (Signed)
Noted  

## 2018-11-16 ENCOUNTER — Ambulatory Visit (INDEPENDENT_AMBULATORY_CARE_PROVIDER_SITE_OTHER): Payer: Medicare Other | Admitting: *Deleted

## 2018-11-16 DIAGNOSIS — I442 Atrioventricular block, complete: Secondary | ICD-10-CM | POA: Diagnosis not present

## 2018-11-17 ENCOUNTER — Telehealth: Payer: Self-pay | Admitting: Student

## 2018-11-17 LAB — CUP PACEART REMOTE DEVICE CHECK
Battery Remaining Longevity: 3 mo
Battery Remaining Percentage: 5 %
Battery Voltage: 2.68 V
Brady Statistic RA Percent Paced: 78 %
Brady Statistic RV Percent Paced: 100 %
Date Time Interrogation Session: 20200528110716
Implantable Lead Implant Date: 20070720
Implantable Lead Implant Date: 20070720
Implantable Lead Implant Date: 20140602
Implantable Lead Location: 753858
Implantable Lead Location: 753859
Implantable Lead Location: 753860
Implantable Lead Model: 4092
Implantable Lead Model: 5076
Implantable Pulse Generator Implant Date: 20140602
Lead Channel Impedance Value: 480 Ohm
Lead Channel Impedance Value: 610 Ohm
Lead Channel Impedance Value: 950 Ohm
Lead Channel Pacing Threshold Amplitude: 1.875 V
Lead Channel Pacing Threshold Pulse Width: 1 ms
Lead Channel Sensing Intrinsic Amplitude: 3.2 mV
Lead Channel Setting Pacing Amplitude: 2 V
Lead Channel Setting Pacing Amplitude: 2.5 V
Lead Channel Setting Pacing Amplitude: 2.875
Lead Channel Setting Pacing Pulse Width: 0.5 ms
Lead Channel Setting Pacing Pulse Width: 1 ms
Lead Channel Setting Sensing Sensitivity: 5 mV
Pulse Gen Model: 3242
Pulse Gen Serial Number: 2960456

## 2018-11-17 NOTE — Telephone Encounter (Signed)
Remote reviewed. Pt estimated ERI ~3.5 months. Discussed with caregiver and reviewed alarm sound. Pt now scheduled for monthly battery checks in [employer] and [employer].    Legrand Como [address]" [place], PA-C 11/17/2018 11:08 AM

## 2018-11-28 ENCOUNTER — Encounter: Payer: Self-pay | Admitting: Cardiology

## 2018-11-28 NOTE — Progress Notes (Signed)
Remote pacemaker transmission.   

## 2018-12-19 ENCOUNTER — Ambulatory Visit (INDEPENDENT_AMBULATORY_CARE_PROVIDER_SITE_OTHER): Payer: Medicare Other | Admitting: *Deleted

## 2018-12-19 DIAGNOSIS — I255 Ischemic cardiomyopathy: Secondary | ICD-10-CM

## 2018-12-19 DIAGNOSIS — I442 Atrioventricular block, complete: Secondary | ICD-10-CM

## 2018-12-20 ENCOUNTER — Telehealth: Payer: Self-pay

## 2018-12-20 ENCOUNTER — Telehealth: Payer: Self-pay | Admitting: Internal Medicine

## 2018-12-20 NOTE — Telephone Encounter (Signed)
New Message ° ° ° °1. Has your device fired? no ° °2. Is you device beeping? no ° °3. Are you experiencing draining or swelling at device site? no ° °4. Are you calling to see if we received your device transmission? yes ° °5. Have you passed out? no ° ° ° °Please route to Device Clinic Pool ° °

## 2018-12-20 NOTE — Telephone Encounter (Signed)
Unable to leave voicemail. Returning call to let pt know we received transmission.

## 2018-12-20 NOTE — Telephone Encounter (Signed)
Spoke with patient to remind of missed remote transmission 

## 2018-12-20 NOTE — Telephone Encounter (Signed)
Spoke with patient's husband. Advised remote transmission was received, estimated remaining longevity <3 months until ERI. Advised to continue monthly battery checks at this time. Pt's husband verbalizes understanding and agreement with plan. He denies questions at this time.

## 2018-12-21 LAB — CUP PACEART REMOTE DEVICE CHECK
Battery Remaining Longevity: 1 mo
Battery Remaining Percentage: 2 %
Battery Voltage: 2.65 V
Brady Statistic AP VP Percent: 88 %
Brady Statistic AP VS Percent: 1 %
Brady Statistic AS VP Percent: 12 %
Brady Statistic AS VS Percent: 1 %
Brady Statistic RA Percent Paced: 78 %
Date Time Interrogation Session: 20200630141253
Implantable Lead Implant Date: 20070720
Implantable Lead Implant Date: 20070720
Implantable Lead Implant Date: 20140602
Implantable Lead Location: 753858
Implantable Lead Location: 753859
Implantable Lead Location: 753860
Implantable Lead Model: 4092
Implantable Lead Model: 5076
Implantable Pulse Generator Implant Date: 20140602
Lead Channel Impedance Value: 440 Ohm
Lead Channel Impedance Value: 580 Ohm
Lead Channel Impedance Value: 910 Ohm
Lead Channel Pacing Threshold Amplitude: 0.5 V
Lead Channel Pacing Threshold Amplitude: 0.75 V
Lead Channel Pacing Threshold Amplitude: 2.25 V
Lead Channel Pacing Threshold Pulse Width: 0.4 ms
Lead Channel Pacing Threshold Pulse Width: 0.5 ms
Lead Channel Pacing Threshold Pulse Width: 1 ms
Lead Channel Sensing Intrinsic Amplitude: 12 mV
Lead Channel Sensing Intrinsic Amplitude: 2.2 mV
Lead Channel Setting Pacing Amplitude: 2 V
Lead Channel Setting Pacing Amplitude: 2.5 V
Lead Channel Setting Pacing Amplitude: 3.25 V
Lead Channel Setting Pacing Pulse Width: 0.5 ms
Lead Channel Setting Pacing Pulse Width: 1 ms
Lead Channel Setting Sensing Sensitivity: 5 mV
Pulse Gen Model: 3242
Pulse Gen Serial Number: 2960456

## 2018-12-29 NOTE — Progress Notes (Signed)
Remote pacemaker transmission.   

## 2018-12-29 NOTE — Addendum Note (Signed)
Addended by: Douglass Rivers D on: 12/29/2018 12:09 PM   Modules accepted: Level of Service

## 2019-01-17 ENCOUNTER — Telehealth: Payer: Self-pay | Admitting: Nurse Practitioner

## 2019-01-17 NOTE — Telephone Encounter (Signed)
Transmission received, device at [facility] on 01/16/19. Will route to Loren to schedule gen change/appt with Dr Caryl Comes to discuss  Chanetta Marshall, NP 01/17/2019 1:02 PM

## 2019-01-19 ENCOUNTER — Ambulatory Visit (INDEPENDENT_AMBULATORY_CARE_PROVIDER_SITE_OTHER): Payer: Medicare Other | Admitting: *Deleted

## 2019-01-19 DIAGNOSIS — I442 Atrioventricular block, complete: Secondary | ICD-10-CM

## 2019-01-20 LAB — CUP PACEART REMOTE DEVICE CHECK
Battery Remaining Longevity: 0 mo
Battery Voltage: 2.6 V
Brady Statistic AP VP Percent: 89 %
Brady Statistic AP VS Percent: 1 %
Brady Statistic AS VP Percent: 11 %
Brady Statistic AS VS Percent: 1 %
Brady Statistic RA Percent Paced: 79 %
Date Time Interrogation Session: 20200730143042
Implantable Lead Implant Date: 20070720
Implantable Lead Implant Date: 20070720
Implantable Lead Implant Date: 20140602
Implantable Lead Location: 753858
Implantable Lead Location: 753859
Implantable Lead Location: 753860
Implantable Lead Model: 4092
Implantable Lead Model: 5076
Implantable Pulse Generator Implant Date: 20140602
Lead Channel Impedance Value: 440 Ohm
Lead Channel Impedance Value: 490 Ohm
Lead Channel Impedance Value: 800 Ohm
Lead Channel Pacing Threshold Amplitude: 0.5 V
Lead Channel Pacing Threshold Amplitude: 0.75 V
Lead Channel Pacing Threshold Amplitude: 2.125 V
Lead Channel Pacing Threshold Pulse Width: 0.4 ms
Lead Channel Pacing Threshold Pulse Width: 0.5 ms
Lead Channel Pacing Threshold Pulse Width: 1 ms
Lead Channel Sensing Intrinsic Amplitude: 12 mV
Lead Channel Sensing Intrinsic Amplitude: 3.1 mV
Lead Channel Setting Pacing Amplitude: 2 V
Lead Channel Setting Pacing Amplitude: 2.5 V
Lead Channel Setting Pacing Amplitude: 3.125
Lead Channel Setting Pacing Pulse Width: 0.5 ms
Lead Channel Setting Pacing Pulse Width: 1 ms
Lead Channel Setting Sensing Sensitivity: 5 mV
Pulse Gen Model: 3242
Pulse Gen Serial Number: 2960456

## 2019-01-25 NOTE — Telephone Encounter (Signed)
ntoed

## 2019-01-27 ENCOUNTER — Encounter: Payer: Self-pay | Admitting: Cardiology

## 2019-01-27 NOTE — Progress Notes (Signed)
Remote pacemaker transmission.   

## 2019-01-27 NOTE — Addendum Note (Signed)
Addended by: Tiajuana Amass on: 01/27/2019 01:12 PM   Modules accepted: Level of Service

## 2019-01-31 ENCOUNTER — Encounter (INDEPENDENT_AMBULATORY_CARE_PROVIDER_SITE_OTHER): Payer: Self-pay

## 2019-01-31 ENCOUNTER — Encounter: Payer: Self-pay | Admitting: *Deleted

## 2019-01-31 ENCOUNTER — Ambulatory Visit (INDEPENDENT_AMBULATORY_CARE_PROVIDER_SITE_OTHER): Payer: Medicare Other | Admitting: Student

## 2019-01-31 ENCOUNTER — Other Ambulatory Visit: Payer: Self-pay

## 2019-01-31 VITALS — BP 124/54 | HR 55 | Ht 63.0 in | Wt 127.0 lb

## 2019-01-31 DIAGNOSIS — I480 Paroxysmal atrial fibrillation: Secondary | ICD-10-CM

## 2019-01-31 DIAGNOSIS — I255 Ischemic cardiomyopathy: Secondary | ICD-10-CM | POA: Diagnosis not present

## 2019-01-31 DIAGNOSIS — I442 Atrioventricular block, complete: Secondary | ICD-10-CM

## 2019-01-31 LAB — CUP PACEART INCLINIC DEVICE CHECK
Battery Remaining Longevity: 0 mo
Battery Voltage: 2.59 V
Brady Statistic RA Percent Paced: 78 %
Brady Statistic RV Percent Paced: 99.92 %
Date Time Interrogation Session: 20200811120801
Implantable Lead Implant Date: 20070720
Implantable Lead Implant Date: 20070720
Implantable Lead Implant Date: 20140602
Implantable Lead Location: 753858
Implantable Lead Location: 753859
Implantable Lead Location: 753860
Implantable Lead Model: 4092
Implantable Lead Model: 5076
Implantable Pulse Generator Implant Date: 20140602
Lead Channel Impedance Value: 425 Ohm
Lead Channel Impedance Value: 650 Ohm
Lead Channel Impedance Value: 975 Ohm
Lead Channel Pacing Threshold Amplitude: 0.5 V
Lead Channel Pacing Threshold Amplitude: 0.5 V
Lead Channel Pacing Threshold Amplitude: 0.5 V
Lead Channel Pacing Threshold Amplitude: 0.5 V
Lead Channel Pacing Threshold Amplitude: 1.5 V
Lead Channel Pacing Threshold Amplitude: 2.125 V
Lead Channel Pacing Threshold Pulse Width: 0.4 ms
Lead Channel Pacing Threshold Pulse Width: 0.4 ms
Lead Channel Pacing Threshold Pulse Width: 0.5 ms
Lead Channel Pacing Threshold Pulse Width: 0.5 ms
Lead Channel Pacing Threshold Pulse Width: 1 ms
Lead Channel Pacing Threshold Pulse Width: 1 ms
Lead Channel Sensing Intrinsic Amplitude: 12 mV
Lead Channel Sensing Intrinsic Amplitude: 2.3 mV
Lead Channel Setting Pacing Amplitude: 2 V
Lead Channel Setting Pacing Amplitude: 2.5 V
Lead Channel Setting Pacing Amplitude: 2.5 V
Lead Channel Setting Pacing Pulse Width: 0.5 ms
Lead Channel Setting Pacing Pulse Width: 1 ms
Lead Channel Setting Sensing Sensitivity: 5 mV
Pulse Gen Model: 3242
Pulse Gen Serial Number: 2960456

## 2019-01-31 NOTE — Patient Instructions (Signed)
Medication Instructions:   Your physician recommends that you continue on your current medications as directed. Please refer to the Current Medication list given to you today.   If you need a refill on your cardiac medications before your next appointment, please call your pharmacy.   Lab work:  [place]   If you have labs (blood work) drawn today and your tests are completely normal, you will receive your results only by: [name] MyChart Message (if you have MyChart) OR . A paper copy in the mail If you have any lab test that is abnormal or we need to change your treatment, we will call you to review the results.  Testing/Procedures: LETTER ATTACHED FOR GEN CHANGE OUT  02-08-19   Follow-Up: At [facility], you and your health needs are our priority.  As part of our continuing mission to provide you with exceptional heart care, we have created designated Provider Care Teams.  These Care Teams include your primary Cardiologist (physician) and Advanced Practice Providers (APPs -  Physician Assistants and Nurse Practitioners) who all work together to provide you with the care you need, when you need it. You will need a follow up appointment in 3 months. after 02-08-19   Please call our office 2 months in advance to schedule this appointment.  You may see Dr. Caryl Comes or one of the following Advanced Practice Providers on your designated Care Team:   Joesph July PA-C  . Tommye Standard, PA-C  Any Other Special Instructions Will Be Listed Below (If Applicable).

## 2019-01-31 NOTE — H&P (View-Only) (Signed)
Electrophysiology Office Note Date: 01/31/2019  ID:  [name], DOB 03/02/1931, MRN [id number]  PCP: Myrlene Broker, MD Primary Cardiologist: No primary care provider on file. Electrophysiologist: None  CC: Pacemaker follow-up  [name] is a 85 y.o. female seen today for Dr. Caryl Comes. They present today to discuss ERI and gen change. Since last being seen in our clinic, the patient reports doing well. Per her husband she is having some problems with her alzheimer's and parkinsons. She denies chest pain, palpitations, dyspnea, PND, orthopnea, nausea, vomiting, dizziness, syncope, edema, weight gain, or early satiety.  Pt at ERI as of 01/16/2019  Device History: St. Jude BiV PPM implanted 11/2012 for Sick-sinus syndrome   Past Medical History:  Diagnosis Date  . Anemia 1940's  . Anginal pain ([place])   . CHF (congestive heart failure) (HCC)    hx  . Chronic anticoagulation    apixaban  . Chronic systolic heart failure ([place]) 10/11/2012  . Complete heart block ([place]) 10/11/2012  . Coronary artery disease   . Depression   . GERD (gastroesophageal reflux disease)   . H/O hiatal hernia   . History of blood transfusion    "related to uretheral OR & when 1st child was born"  . Hyperlipidemia   . Hypertension   . Hypothyroidism   . Ischemic cardiomyopathy- s/p CABG 10/11/2012   EF 39%  . Migraine    ~ qd for 11 months now (05/23/2014)  . OSA on CPAP 10/11/2012   No longer using CPAP due to worsening of Migraines. AHI was .45/hr at 9 cm water pressure. RDI was 0.4/hr. Patient was unable to reach REM sleep and maintain the supine position at optimal pressure. Patient was able to sleep for prolonged periods of time at 9 cm water pressure without suffering further from respiratory events.  [name] 10/11/2012   Updated to BiV  . Parkinson's disease ([place])   . Shortness of breath   . Stress incontinence    multiple injections of collagen  . Thrombocytopenia ([place])   .  Weakness 10/06/12   Past Surgical History:  Procedure Laterality Date  . APPENDECTOMY    . BI-VENTRICULAR PACEMAKER REVISION (CRT-R)  11/21/2012   BIV   UPGRADE    WITH DR Caryl Comes  . BI-VENTRICULAR PACEMAKER UPGRADE N/A 10/31/2012   Procedure: BI-VENTRICULAR PACEMAKER UPGRADE;  Surgeon: Deboraha Sprang, MD;  Location[facility] CATH LAB;  Service: Cardiovascular;  Laterality: N/A;  . BI-VENTRICULAR PACEMAKER UPGRADE N/A 11/21/2012   Procedure: BI-VENTRICULAR PACEMAKER UPGRADE;  Surgeon: Deboraha Sprang, MD;  Location: [facility] CATH LAB;  Service: Cardiovascular;  Laterality: N/A;  . CARDIAC CATHETERIZATION  05/2012   (1st cath 2012) This required recatheterization by Dr. Claiborne Billings and had a patent LIMA-LAD with distal diffuse disease, patent SVG-OM, 10% to 20% mid right coronary but an EF of about 35%, quantitatively iw was 38.9.  [name] CARDIAC CATHETERIZATION     "today;s makes 79" (05/23/2014)  . CARDIAC VALVE REPLACEMENT    . CATARACT EXTRACTION, BILATERAL Bilateral   . CHOLECYSTECTOMY    . COLONOSCOPY WITH PROPOFOL N/A 09/22/2016   Procedure: COLONOSCOPY WITH PROPOFOL;  Surgeon: Mauri Pole, MD;  Location: [place] ENDOSCOPY;  Service: Endoscopy;  Laterality: N/A;  . CORONARY ANGIOPLASTY WITH STENT PLACEMENT  2012   No significant CAD, patent but ectatic graft supplying the ramus, patent LIMA-LAD, and an EF of 50% at that time. She had an occluded stent to her circumflex from remote intervention. Required recatheterization  05/2012 by Dr. Claiborne Billings and had a patent LIMA-LAD with distal diffuse disease, patent SVG-OM, 10%-20% mid right coronary but an EF of about 35%, quantitatively it was 38.9.  . CORONARY ANGIOPLASTY WITH STENT PLACEMENT     "I have a tota of 4" (05/23/2014)  . CORONARY ARTERY BYPASS GRAFT  2003   With LIMA-LAD and vein graft to OM, and associated mitral valve repair with anuloplasty ring and quadrangular resection of the posterior leaflet, 24 mm Seguin ring by Dr. Roxy Manns.  . ESOPHAGOGASTRODUODENOSCOPY N/A  09/21/2016   Procedure: ESOPHAGOGASTRODUODENOSCOPY (EGD);  Surgeon: Mauri Pole, MD;  Location: [facility] ENDOSCOPY;  Service: Endoscopy;  Laterality: N/A;  . INCONTINENCE SURGERY    . INSERT / REPLACE / REMOVE PACEMAKER  2007  . LEFT HEART CATHETERIZATION WITH CORONARY ANGIOGRAM N/A 06/07/2013   Procedure: LEFT HEART CATHETERIZATION WITH CORONARY ANGIOGRAM;  Surgeon: Lorretta Harp, MD;  Location: [facility] CATH LAB;  Service: Cardiovascular;  Laterality: N/A;  . LEFT HEART CATHETERIZATION WITH CORONARY/GRAFT ANGIOGRAM N/A 05/23/2012   Procedure: LEFT HEART CATHETERIZATION WITH Beatrix Fetters;  Surgeon: Troy Sine, MD;  Location: [facility] CATH LAB;  Service: Cardiovascular;  Laterality: N/A;  . LEFT HEART CATHETERIZATION WITH CORONARY/GRAFT ANGIOGRAM N/A 05/23/2014   Procedure: LEFT HEART CATHETERIZATION WITH Beatrix Fetters;  Surgeon: Wellington Hampshire, MD;  Location: [place] CATH LAB;  Service: Cardiovascular;  Laterality: N/A;  . lexiscan myocardial stress test  08/20/11   The EF = 34%. This is a low risk scan. There is a new fix abnormality in the distal LAD artery territory. Although no reversibility is seen, high grade obstruction with resting ischemia cannot be excluded.  [name] PACEMAKER GENERATOR CHANGE    . US ECHOCARDIOGRAPHY  04/13/12   EF - 25% to 35% with intraventricular dyssynchrony, RVSP of 46, moderate TR, and mild to moderate AI. She had a mild increased mitral valve gradient of 5 mmHg with minimal MR.   [name] VAGINAL HYSTERECTOMY      Current Outpatient Medications  Medication Sig Dispense Refill  . apixaban (ELIQUIS) 2.5 MG TABS tablet Take 1 tablet (2.5 mg total) by mouth 2 (two) times daily. 180 tablet 3  . buPROPion (WELLBUTRIN XL) 150 MG 24 hr tablet Take 1 tablet by mouth daily.    . busPIRone (BUSPAR) 10 MG tablet Take 10 mg by mouth 2 (two) times daily.    . carbidopa-levodopa (SINEMET IR) 25-100 MG tablet Take 1 tablet by mouth 3 (three) times daily.    [name] donepezil  (ARICEPT) 10 MG tablet Take 10 mg by mouth at bedtime.    . folic acid (FOLVITE) 1 MG tablet Take 1 tablet by mouth daily.    . furosemide (LASIX) 20 MG tablet Take 1 tablet (20 mg total) by mouth daily. PLEASE CONTACT OFFICE FOR ADDITIONAL REFILLS 15 tablet 0  . irbesartan (AVAPRO) 300 MG tablet TAKE ONE (1) TABLET ONCE DAILY 30 tablet 8  . nebivolol (BYSTOLIC) 5 MG tablet Take 1 tablet (5 mg total) by mouth at bedtime. Please schedule an appointment with Dr Claiborne Billings for future refills. 30 tablet 9  . NITROSTAT 0.4 MG SL tablet PLACE 1 TAB UNDER TONGUE EVERY 5 MINUTES UP TO 3 DOSES FOR CHEST PAIN 25 tablet 2  . pantoprazole (PROTONIX) 20 MG tablet Take 1 tablet by mouth daily.    [name] PARoxetine (PAXIL) 30 MG tablet Take 30 mg by mouth every evening.     No current facility-administered medications for this visit.     Allergies:  Buprenorphine hcl, Dilaudid [hydromorphone hcl], Morphine and related, Hydromorphone, Morphine, Ramipril, Sulfur, Sulfa antibiotics, and Sulfasalazine   Social History: Social History   Socioeconomic History  . Marital status: Married    Spouse name: Not on file  . Number of children: Not on file  . Years of education: Not on file  . Highest education level: Not on file  Occupational History  . Not on file  Social Needs  . Financial resource strain: Not on file  . Food insecurity    Worry: Not on file    Inability: Not on file  . Transportation needs    Medical: Not on file    Non-medical: Not on file  Tobacco Use  . Smoking status: Never Smoker  . Smokeless tobacco: Never Used  Substance and Sexual Activity  . Alcohol use: No  . Drug use: No  . Sexual activity: Not Currently  Lifestyle  . Physical activity    Days per week: Not on file    Minutes per session: Not on file  . Stress: Not on file  Relationships  . Social Herbalist on phone: Not on file    Gets together: Not on file    Attends religious service: Not on file    Active  member of club or organization: Not on file    Attends meetings of clubs or organizations: Not on file    Relationship status: Not on file  . Intimate partner violence    Fear of current or ex partner: Not on file    Emotionally abused: Not on file    Physically abused: Not on file    Forced sexual activity: Not on file  Other Topics Concern  . Not on file  Social History Narrative  . Not on file    Family History: Family History  Problem Relation Age of Onset  . Cancer Father      Review of Systems: All other systems reviewed and are otherwise negative except as noted above.  Physical Exam: Vitals:   01/31/19 1057  BP: (!) 124/54  Pulse: (!) 55  Weight: 127 lb (57.6 kg)  Height: 5\' 3"  (1.6 m)     GEN- The patient is well appearing, alert and oriented x 3 today.   HEENT: normocephalic, atraumatic; sclera clear, conjunctiva pink; hearing intact; oropharynx clear; neck supple  Lungs- Clear to ausculation bilaterally, normal work of breathing.  No wheezes, rales, rhonchi Heart- Regular rate and rhythm, no murmurs, rubs or gallops  GI- soft, non-tender, non-distended, bowel sounds present  Extremities- no clubbing, cyanosis, or edema  MS- no significant deformity or atrophy Skin- warm and dry, no rash or lesion; PPM pocket well healed Psych- euthymic mood, full affect Neuro- strength and sensation are intact  PPM Interrogation- reviewed in detail today,  See PACEART report  EKG:  EKG is ordered today. The ekg ordered today shows AS-VP 55 bpm, personally reviewed.   Recent Labs: No results found for requested labs within last 8760 hours.   Wt Readings from Last 3 Encounters:  01/31/19 127 lb (57.6 kg)  04/13/18 130 lb (59 kg)  04/08/17 120 lb 3.2 oz (54.5 kg)     Assessment and Plan:  1.  Symptomatic bradycardia s/p St. Jude PPM  Normal PPM function at ERI as of 01/16/2019 See Pace Art report  2. Afib detected on device Previously discussed and started on  Apixaban, but patient has again stopped due to falls.   We discussed the  risks and benefits of gen change including risk and infection. Pt and husband understood, verbalized understanding, and agreed to proceed. All questions answered.    Current medicines are reviewed at length with the patient today.   The patient does not have concerns regarding her medicines.  The following changes were made today:  none  Labs/ tests ordered today include:  Orders Placed This Encounter  Procedures  . Basic metabolic panel  . CBC  . CUP PACEART [place]  . EKG 12-Lead     Disposition:   Follow up with Dr. Caryl Comes for Gen change 02/08/2019   Signed, Shirley Friar, PA-C  01/31/2019 12:11 PM  [facility] [address] [place] [address] [phone] (office) [phone] (fax)

## 2019-01-31 NOTE — Progress Notes (Signed)
Electrophysiology Office Note Date: 01/31/2019  ID:  [name], DOB 03-18-1931, MRN [id number]  PCP: Myrlene Broker, MD Primary Cardiologist: No primary care provider on file. Electrophysiologist: None  CC: Pacemaker follow-up  [name] is a 85 y.o. female seen today for Dr. Caryl Comes. They present today to discuss ERI and gen change. Since last being seen in our clinic, the patient reports doing well. Per her husband she is having some problems with her alzheimer's and parkinsons. She denies chest pain, palpitations, dyspnea, PND, orthopnea, nausea, vomiting, dizziness, syncope, edema, weight gain, or early satiety.  Pt at ERI as of 01/16/2019  Device History: St. Jude BiV PPM implanted 11/2012 for Sick-sinus syndrome   Past Medical History:  Diagnosis Date  . Anemia 1940's  . Anginal pain ([place])   . CHF (congestive heart failure) (HCC)    hx  . Chronic anticoagulation    apixaban  . Chronic systolic heart failure ([place]) 10/11/2012  . Complete heart block ([place]) 10/11/2012  . Coronary artery disease   . Depression   . GERD (gastroesophageal reflux disease)   . H/O hiatal hernia   . History of blood transfusion    "related to uretheral OR & when 1st child was born"  . Hyperlipidemia   . Hypertension   . Hypothyroidism   . Ischemic cardiomyopathy- s/p CABG 10/11/2012   EF 39%  . Migraine    ~ qd for 11 months now (05/23/2014)  . OSA on CPAP 10/11/2012   No longer using CPAP due to worsening of Migraines. AHI was .45/hr at 9 cm water pressure. RDI was 0.4/hr. Patient was unable to reach REM sleep and maintain the supine position at optimal pressure. Patient was able to sleep for prolonged periods of time at 9 cm water pressure without suffering further from respiratory events.  [name] 10/11/2012   Updated to BiV  . Parkinson's disease ([place])   . Shortness of breath   . Stress incontinence    multiple injections of collagen  . Thrombocytopenia ([place])   .  Weakness 10/06/12   Past Surgical History:  Procedure Laterality Date  . APPENDECTOMY    . BI-VENTRICULAR PACEMAKER REVISION (CRT-R)  11/21/2012   BIV   UPGRADE    WITH DR Caryl Comes  . BI-VENTRICULAR PACEMAKER UPGRADE N/A 10/31/2012   Procedure: BI-VENTRICULAR PACEMAKER UPGRADE;  Surgeon: Deboraha Sprang, MD;  Location: [facility] CATH LAB;  Service: Cardiovascular;  Laterality: N/A;  . BI-VENTRICULAR PACEMAKER UPGRADE N/A 11/21/2012   Procedure: BI-VENTRICULAR PACEMAKER UPGRADE;  Surgeon: Deboraha Sprang, MD;  Location: [facility] CATH LAB;  Service: Cardiovascular;  Laterality: N/A;  . CARDIAC CATHETERIZATION  05/2012   (1st cath 2012) This required recatheterization by Dr. Claiborne Billings and had a patent LIMA-LAD with distal diffuse disease, patent SVG-OM, 10% to 20% mid right coronary but an EF of about 35%, quantitatively iw was 38.9.  [name] CARDIAC CATHETERIZATION     "today;s makes 75" (05/23/2014)  . CARDIAC VALVE REPLACEMENT    . CATARACT EXTRACTION, BILATERAL Bilateral   . CHOLECYSTECTOMY    . COLONOSCOPY WITH PROPOFOL N/A 09/22/2016   Procedure: COLONOSCOPY WITH PROPOFOL;  Surgeon: Mauri Pole, MD;  Location: [place] ENDOSCOPY;  Service: Endoscopy;  Laterality: N/A;  . CORONARY ANGIOPLASTY WITH STENT PLACEMENT  2012   No significant CAD, patent but ectatic graft supplying the ramus, patent LIMA-LAD, and an EF of 50% at that time. She had an occluded stent to her circumflex from remote intervention. Required recatheterization  05/2012 by Dr. Claiborne Billings and had a patent LIMA-LAD with distal diffuse disease, patent SVG-OM, 10%-20% mid right coronary but an EF of about 35%, quantitatively it was 38.9.  . CORONARY ANGIOPLASTY WITH STENT PLACEMENT     "I have a tota of 4" (05/23/2014)  . CORONARY ARTERY BYPASS GRAFT  2003   With LIMA-LAD and vein graft to OM, and associated mitral valve repair with anuloplasty ring and quadrangular resection of the posterior leaflet, 24 mm Seguin ring by Dr. Roxy Manns.  . ESOPHAGOGASTRODUODENOSCOPY N/A  09/21/2016   Procedure: ESOPHAGOGASTRODUODENOSCOPY (EGD);  Surgeon: Mauri Pole, MD;  Location: [facility] ENDOSCOPY;  Service: Endoscopy;  Laterality: N/A;  . INCONTINENCE SURGERY    . INSERT / REPLACE / REMOVE PACEMAKER  2007  . LEFT HEART CATHETERIZATION WITH CORONARY ANGIOGRAM N/A 06/07/2013   Procedure: LEFT HEART CATHETERIZATION WITH CORONARY ANGIOGRAM;  Surgeon: Lorretta Harp, MD;  Location: [facility] CATH LAB;  Service: Cardiovascular;  Laterality: N/A;  . LEFT HEART CATHETERIZATION WITH CORONARY/GRAFT ANGIOGRAM N/A 05/23/2012   Procedure: LEFT HEART CATHETERIZATION WITH Beatrix Fetters;  Surgeon: Troy Sine, MD;  Location: [facility] CATH LAB;  Service: Cardiovascular;  Laterality: N/A;  . LEFT HEART CATHETERIZATION WITH CORONARY/GRAFT ANGIOGRAM N/A 05/23/2014   Procedure: LEFT HEART CATHETERIZATION WITH Beatrix Fetters;  Surgeon: Wellington Hampshire, MD;  Location: [place] CATH LAB;  Service: Cardiovascular;  Laterality: N/A;  . lexiscan myocardial stress test  08/20/11   The EF = 34%. This is a low risk scan. There is a new fix abnormality in the distal LAD artery territory. Although no reversibility is seen, high grade obstruction with resting ischemia cannot be excluded.  [name] PACEMAKER GENERATOR CHANGE    . US ECHOCARDIOGRAPHY  04/13/12   EF - 25% to 35% with intraventricular dyssynchrony, RVSP of 46, moderate TR, and mild to moderate AI. She had a mild increased mitral valve gradient of 5 mmHg with minimal MR.   [name] VAGINAL HYSTERECTOMY      Current Outpatient Medications  Medication Sig Dispense Refill  . apixaban (ELIQUIS) 2.5 MG TABS tablet Take 1 tablet (2.5 mg total) by mouth 2 (two) times daily. 180 tablet 3  . buPROPion (WELLBUTRIN XL) 150 MG 24 hr tablet Take 1 tablet by mouth daily.    . busPIRone (BUSPAR) 10 MG tablet Take 10 mg by mouth 2 (two) times daily.    . carbidopa-levodopa (SINEMET IR) 25-100 MG tablet Take 1 tablet by mouth 3 (three) times daily.    [name] donepezil  (ARICEPT) 10 MG tablet Take 10 mg by mouth at bedtime.    . folic acid (FOLVITE) 1 MG tablet Take 1 tablet by mouth daily.    . furosemide (LASIX) 20 MG tablet Take 1 tablet (20 mg total) by mouth daily. PLEASE CONTACT OFFICE FOR ADDITIONAL REFILLS 15 tablet 0  . irbesartan (AVAPRO) 300 MG tablet TAKE ONE (1) TABLET ONCE DAILY 30 tablet 8  . nebivolol (BYSTOLIC) 5 MG tablet Take 1 tablet (5 mg total) by mouth at bedtime. Please schedule an appointment with Dr Claiborne Billings for future refills. 30 tablet 9  . NITROSTAT 0.4 MG SL tablet PLACE 1 TAB UNDER TONGUE EVERY 5 MINUTES UP TO 3 DOSES FOR CHEST PAIN 25 tablet 2  . pantoprazole (PROTONIX) 20 MG tablet Take 1 tablet by mouth daily.    [name] PARoxetine (PAXIL) 30 MG tablet Take 30 mg by mouth every evening.     No current facility-administered medications for this visit.     Allergies:  Buprenorphine hcl, Dilaudid [hydromorphone hcl], Morphine and related, Hydromorphone, Morphine, Ramipril, Sulfur, Sulfa antibiotics, and Sulfasalazine   Social History: Social History   Socioeconomic History  . Marital status: Married    Spouse name: Not on file  . Number of children: Not on file  . Years of education: Not on file  . Highest education level: Not on file  Occupational History  . Not on file  Social Needs  . Financial resource strain: Not on file  . Food insecurity    Worry: Not on file    Inability: Not on file  . Transportation needs    Medical: Not on file    Non-medical: Not on file  Tobacco Use  . Smoking status: Never Smoker  . Smokeless tobacco: Never Used  Substance and Sexual Activity  . Alcohol use: No  . Drug use: No  . Sexual activity: Not Currently  Lifestyle  . Physical activity    Days per week: Not on file    Minutes per session: Not on file  . Stress: Not on file  Relationships  . Social Herbalist on phone: Not on file    Gets together: Not on file    Attends religious service: Not on file    Active  member of club or organization: Not on file    Attends meetings of clubs or organizations: Not on file    Relationship status: Not on file  . Intimate partner violence    Fear of current or ex partner: Not on file    Emotionally abused: Not on file    Physically abused: Not on file    Forced sexual activity: Not on file  Other Topics Concern  . Not on file  Social History Narrative  . Not on file    Family History: Family History  Problem Relation Age of Onset  . Cancer Father      Review of Systems: All other systems reviewed and are otherwise negative except as noted above.  Physical Exam: Vitals:   01/31/19 1057  BP: (!) 124/54  Pulse: (!) 55  Weight: 127 lb (57.6 kg)  Height: 5\' 3"  (1.6 m)     GEN- The patient is well appearing, alert and oriented x 3 today.   HEENT: normocephalic, atraumatic; sclera clear, conjunctiva pink; hearing intact; oropharynx clear; neck supple  Lungs- Clear to ausculation bilaterally, normal work of breathing.  No wheezes, rales, rhonchi Heart- Regular rate and rhythm, no murmurs, rubs or gallops  GI- soft, non-tender, non-distended, bowel sounds present  Extremities- no clubbing, cyanosis, or edema  MS- no significant deformity or atrophy Skin- warm and dry, no rash or lesion; PPM pocket well healed Psych- euthymic mood, full affect Neuro- strength and sensation are intact  PPM Interrogation- reviewed in detail today,  See PACEART report  EKG:  EKG is ordered today. The ekg ordered today shows AS-VP 55 bpm, personally reviewed.   Recent Labs: No results found for requested labs within last 8760 hours.   Wt Readings from Last 3 Encounters:  01/31/19 127 lb (57.6 kg)  04/13/18 130 lb (59 kg)  04/08/17 120 lb 3.2 oz (54.5 kg)     Assessment and Plan:  1.  Symptomatic bradycardia s/p St. Jude PPM  Normal PPM function at ERI as of 01/16/2019 See Pace Art report  2. Afib detected on device Previously discussed and started on  Apixaban, but patient has again stopped due to falls.   We discussed the  risks and benefits of gen change including risk and infection. Pt and husband understood, verbalized understanding, and agreed to proceed. All questions answered.    Current medicines are reviewed at length with the patient today.   The patient does not have concerns regarding her medicines.  The following changes were made today:  none  Labs/ tests ordered today include:  Orders Placed This Encounter  Procedures  . Basic metabolic panel  . CBC  . CUP PACEART [place]  . EKG 12-Lead     Disposition:   Follow up with Dr. Caryl Comes for Gen change 02/08/2019   Signed, Shirley Friar, PA-C  01/31/2019 12:11 PM  [facility] [address] [place]  [address] [phone] (office) [phone] (fax)

## 2019-02-01 DIAGNOSIS — I25810 Atherosclerosis of coronary artery bypass graft(s) without angina pectoris: Secondary | ICD-10-CM

## 2019-02-01 DIAGNOSIS — K219 Gastro-esophageal reflux disease without esophagitis: Secondary | ICD-10-CM | POA: Diagnosis not present

## 2019-02-01 DIAGNOSIS — R0602 Shortness of breath: Secondary | ICD-10-CM | POA: Diagnosis not present

## 2019-02-01 DIAGNOSIS — R079 Chest pain, unspecified: Secondary | ICD-10-CM

## 2019-02-01 DIAGNOSIS — R1310 Dysphagia, unspecified: Secondary | ICD-10-CM | POA: Diagnosis not present

## 2019-02-01 LAB — CBC
Hematocrit: 37.5 % (ref 34.0–46.6)
Hemoglobin: 11.9 g/dL (ref 11.1–15.9)
MCH: 29.4 pg (ref 26.6–33.0)
MCHC: 31.7 g/dL (ref 31.5–35.7)
MCV: 93 fL (ref 79–97)
Platelets: 153 10*3/uL (ref 150–450)
RBC: 4.05 x10E6/uL (ref 3.77–5.28)
RDW: 13.2 % (ref 11.7–15.4)
WBC: 5.7 10*3/uL (ref 3.4–10.8)

## 2019-02-01 LAB — BASIC METABOLIC PANEL
BUN/Creatinine Ratio: 12 (ref 12–28)
BUN: 17 mg/dL (ref 8–27)
CO2: 23 mmol/L (ref 20–29)
Calcium: 9.2 mg/dL (ref 8.7–10.3)
Chloride: 99 mmol/L (ref 96–106)
Creatinine, Ser: 1.37 mg/dL — ABNORMAL HIGH (ref 0.57–1.00)
GFR calc Af Amer: 40 mL/min/{1.73_m2} — ABNORMAL LOW (ref >59)
GFR calc non Af Amer: 35 mL/min/{1.73_m2} — ABNORMAL LOW (ref >59)
Glucose: 150 mg/dL — ABNORMAL HIGH (ref 65–99)
Potassium: 4.4 mmol/L (ref 3.5–5.2)
Sodium: 140 mmol/L (ref 134–144)

## 2019-02-02 DIAGNOSIS — K219 Gastro-esophageal reflux disease without esophagitis: Secondary | ICD-10-CM | POA: Diagnosis not present

## 2019-02-02 DIAGNOSIS — R0602 Shortness of breath: Secondary | ICD-10-CM | POA: Diagnosis not present

## 2019-02-02 DIAGNOSIS — R1310 Dysphagia, unspecified: Secondary | ICD-10-CM | POA: Diagnosis not present

## 2019-02-02 DIAGNOSIS — R079 Chest pain, unspecified: Secondary | ICD-10-CM

## 2019-02-03 ENCOUNTER — Inpatient Hospital Stay (HOSPITAL_COMMUNITY): Admission: RE | Admit: 2019-02-03 | Payer: Medicare Other | Source: Ambulatory Visit

## 2019-02-03 ENCOUNTER — Telehealth: Payer: Self-pay | Admitting: Internal Medicine

## 2019-02-03 NOTE — Telephone Encounter (Signed)
Pts son states his mother had a COVID test about a week ago while in the hospital and was wondering if this test would suffice for the pre procedure testing that is scheduled for tomorrow. Informed him that the test must be redone d/t previous test being done greater than 4 days pre procedure. He verbalized understanding.

## 2019-02-03 NOTE — Telephone Encounter (Signed)
New message:     Patient son calling stating that his mother had a covid in the hospital and would like to know if that is ok for that test.

## 2019-02-04 ENCOUNTER — Other Ambulatory Visit (HOSPITAL_COMMUNITY)
Admission: RE | Admit: 2019-02-04 | Discharge: 2019-02-04 | Disposition: A | Discharge status: Home / Self Care | Payer: Medicare Other | Source: Ambulatory Visit | Attending: Internal Medicine | Admitting: Internal Medicine

## 2019-02-04 ENCOUNTER — Other Ambulatory Visit (HOSPITAL_COMMUNITY): Payer: Medicare Other

## 2019-02-04 DIAGNOSIS — Z01812 Encounter for preprocedural laboratory examination: Secondary | ICD-10-CM | POA: Diagnosis not present

## 2019-02-04 DIAGNOSIS — Z20828 Contact with and (suspected) exposure to other viral communicable diseases: Secondary | ICD-10-CM | POA: Insufficient documentation

## 2019-02-04 LAB — SARS CORONAVIRUS 2 (TAT 6-24 HRS): SARS Coronavirus 2: NEGATIVE

## 2019-02-08 ENCOUNTER — Other Ambulatory Visit: Payer: Self-pay

## 2019-02-08 ENCOUNTER — Ambulatory Visit (HOSPITAL_COMMUNITY)
Admission: RE | Disposition: A | Discharge status: Home / Self Care | Payer: Self-pay | Source: Home / Self Care | Attending: Internal Medicine

## 2019-02-08 ENCOUNTER — Ambulatory Visit (HOSPITAL_COMMUNITY)
Admission: RE | Admit: 2019-02-08 | Discharge: 2019-02-08 | Disposition: A | Discharge status: Home / Self Care | Payer: Medicare Other | Attending: Internal Medicine | Admitting: Internal Medicine

## 2019-02-08 DIAGNOSIS — I110 Hypertensive heart disease with heart failure: Secondary | ICD-10-CM | POA: Diagnosis not present

## 2019-02-08 DIAGNOSIS — I442 Atrioventricular block, complete: Secondary | ICD-10-CM | POA: Diagnosis not present

## 2019-02-08 DIAGNOSIS — Z952 Presence of prosthetic heart valve: Secondary | ICD-10-CM | POA: Insufficient documentation

## 2019-02-08 DIAGNOSIS — Z951 Presence of aortocoronary bypass graft: Secondary | ICD-10-CM | POA: Insufficient documentation

## 2019-02-08 DIAGNOSIS — I495 Sick sinus syndrome: Secondary | ICD-10-CM | POA: Diagnosis not present

## 2019-02-08 DIAGNOSIS — K449 Diaphragmatic hernia without obstruction or gangrene: Secondary | ICD-10-CM | POA: Insufficient documentation

## 2019-02-08 DIAGNOSIS — E039 Hypothyroidism, unspecified: Secondary | ICD-10-CM | POA: Insufficient documentation

## 2019-02-08 DIAGNOSIS — Z79899 Other long term (current) drug therapy: Secondary | ICD-10-CM | POA: Insufficient documentation

## 2019-02-08 DIAGNOSIS — F0280 Dementia in other diseases classified elsewhere without behavioral disturbance: Secondary | ICD-10-CM | POA: Diagnosis not present

## 2019-02-08 DIAGNOSIS — I2510 Atherosclerotic heart disease of native coronary artery without angina pectoris: Secondary | ICD-10-CM | POA: Insufficient documentation

## 2019-02-08 DIAGNOSIS — Z882 Allergy status to sulfonamides status: Secondary | ICD-10-CM | POA: Diagnosis not present

## 2019-02-08 DIAGNOSIS — Z7901 Long term (current) use of anticoagulants: Secondary | ICD-10-CM | POA: Insufficient documentation

## 2019-02-08 DIAGNOSIS — Z955 Presence of coronary angioplasty implant and graft: Secondary | ICD-10-CM | POA: Diagnosis not present

## 2019-02-08 DIAGNOSIS — G309 Alzheimer's disease, unspecified: Secondary | ICD-10-CM | POA: Insufficient documentation

## 2019-02-08 DIAGNOSIS — G20 Parkinson's disease: Secondary | ICD-10-CM | POA: Diagnosis not present

## 2019-02-08 DIAGNOSIS — I5022 Chronic systolic (congestive) heart failure: Secondary | ICD-10-CM | POA: Insufficient documentation

## 2019-02-08 DIAGNOSIS — E785 Hyperlipidemia, unspecified: Secondary | ICD-10-CM | POA: Diagnosis not present

## 2019-02-08 DIAGNOSIS — I255 Ischemic cardiomyopathy: Secondary | ICD-10-CM | POA: Diagnosis not present

## 2019-02-08 DIAGNOSIS — K219 Gastro-esophageal reflux disease without esophagitis: Secondary | ICD-10-CM | POA: Diagnosis not present

## 2019-02-08 DIAGNOSIS — Z45010 Encounter for checking and testing of cardiac pacemaker pulse generator [battery]: Secondary | ICD-10-CM | POA: Diagnosis not present

## 2019-02-08 DIAGNOSIS — F329 Major depressive disorder, single episode, unspecified: Secondary | ICD-10-CM | POA: Insufficient documentation

## 2019-02-08 DIAGNOSIS — Z885 Allergy status to narcotic agent status: Secondary | ICD-10-CM | POA: Diagnosis not present

## 2019-02-08 DIAGNOSIS — I4891 Unspecified atrial fibrillation: Secondary | ICD-10-CM | POA: Insufficient documentation

## 2019-02-08 DIAGNOSIS — G4733 Obstructive sleep apnea (adult) (pediatric): Secondary | ICD-10-CM | POA: Insufficient documentation

## 2019-02-08 HISTORY — PX: BIV PACEMAKER GENERATOR CHANGEOUT: EP1198

## 2019-02-08 LAB — SURGICAL PCR SCREEN
MRSA, PCR: NEGATIVE
Staphylococcus aureus: NEGATIVE

## 2019-02-08 SURGERY — BIV PACEMAKER GENERATOR CHANGEOUT

## 2019-02-08 MED ORDER — ACETAMINOPHEN 325 MG PO TABS: 325.0000 mg | ORAL_TABLET | ORAL | Status: DC | PRN

## 2019-02-08 MED ORDER — SODIUM CHLORIDE 0.9 % IV SOLN
INTRAVENOUS | Status: DC
  Administered 2019-02-08: 12:00:00 via INTRAVENOUS

## 2019-02-08 MED ORDER — ONDANSETRON HCL 4 MG/2ML IJ SOLN: 4.0000 mg | Freq: Four times a day (QID) | INTRAMUSCULAR | Status: DC | PRN

## 2019-02-08 MED ORDER — SODIUM CHLORIDE 0.9 % IV SOLN
INTRAVENOUS | Status: AC
  Filled 2019-02-08: qty 2

## 2019-02-08 MED ORDER — CEFAZOLIN SODIUM-DEXTROSE 2-4 GM/100ML-% IV SOLN
2.0000 g | INTRAVENOUS | Status: AC
  Administered 2019-02-08: 2 g via INTRAVENOUS

## 2019-02-08 MED ORDER — CEFAZOLIN SODIUM-DEXTROSE 2-4 GM/100ML-% IV SOLN
INTRAVENOUS | Status: AC
  Filled 2019-02-08: qty 100

## 2019-02-08 MED ORDER — CHLORHEXIDINE GLUCONATE 4 % EX LIQD: 60.0000 mL | Freq: Once | CUTANEOUS | Status: DC

## 2019-02-08 MED ORDER — MUPIROCIN 2 % EX OINT
1.0000 "application " | TOPICAL_OINTMENT | Freq: Once | CUTANEOUS | Status: AC
  Administered 2019-02-08: 1 via TOPICAL
  Filled 2019-02-08: qty 22

## 2019-02-08 MED ORDER — LIDOCAINE HCL (PF) 1 % IJ SOLN
INTRAMUSCULAR | Status: DC | PRN
  Administered 2019-02-08: 16:00:00 45 mL

## 2019-02-08 MED ORDER — SODIUM CHLORIDE 0.9 % IV SOLN
80.0000 mg | INTRAVENOUS | Status: AC
  Administered 2019-02-08: 16:00:00 80 mg

## 2019-02-08 MED ORDER — LIDOCAINE HCL 1 % IJ SOLN
INTRAMUSCULAR | Status: AC
  Filled 2019-02-08: qty 60

## 2019-02-08 SURGICAL SUPPLY — 8 items
CABLE SURGICAL S-101-97-12 (CABLE) ×3 IMPLANT
HEMOSTAT SURGICEL 2X4 FIBR (HEMOSTASIS) ×2 IMPLANT
PACEMAKER QUDR ALLR CRT PM3562 (Pacemaker) IMPLANT
PAD PRO RADIOLUCENT 2001M-C (PAD) ×3 IMPLANT
PMKR QUADRA ALLURE CRT PM3562 (Pacemaker) ×3 IMPLANT
POUCH AIGIS-R ANTIBACT PPM (Mesh General) ×3 IMPLANT
POUCH AIGIS-R ANTIBACT PPM MED (Mesh General) IMPLANT
TRAY PACEMAKER INSERTION (PACKS) ×3 IMPLANT

## 2019-02-08 NOTE — Discharge Instructions (Signed)
Post procedure care instructions// RESTART YOUR ELIQUIS TOMORROW 8/20 Keep incision clean and dry for 10 days. No driving for 2 days.  You can remove outer dressing tomorrow. Leave steri-strips (little pieces of tape) on until seen in the office for wound check appointment. Call the office [phone]) for redness, drainage, swelling, or fever.   Pacemaker Battery Change, Care After This sheet gives you information about how to care for yourself after your procedure. Your health care provider may also give you more specific instructions. If you have problems or questions, contact your health care provider. What can I expect after the procedure? After your procedure, it is common to have:  Pain or soreness at the site where the pacemaker was inserted.  Swelling at the site where the pacemaker was inserted. Follow these instructions at home: Incision care   Keep the incision clean and dry. ? Do not take baths, swim, or use a hot tub until your health care provider approves. ? You may shower the day after your procedure, or as directed by your health care provider. ? Pat the area dry with a clean towel. Do not rub the area. This may cause bleeding.  Follow instructions from your health care provider about how to take care of your incision. Make sure you: ? Wash your hands with soap and water before you change your bandage (dressing). If soap and water are not available, use hand sanitizer. ? Change your dressing as told by your health care provider. ? Leave stitches (sutures), skin glue, or adhesive strips in place. These skin closures may need to stay in place for 2 weeks or longer. If adhesive strip edges start to loosen and curl up, you may trim the loose edges. Do not remove adhesive strips completely unless your health care provider tells you to do that.  Check your incision area every day for signs of infection. Check for: ? More redness, swelling, or pain. ? More fluid or  blood. ? Warmth. ? Pus or a bad smell. Activity  Do not lift anything that is heavier than 10 lb (4.5 kg) until your health care provider says it is okay to do so.  For the first 2 weeks, or as long as told by your health care provider: ? Avoid lifting your left arm higher than your shoulder. ? Be gentle when you move your arms over your head. It is okay to raise your arm to comb your hair. ? Avoid strenuous exercise.  Ask your health care provider when it is okay to: ? Resume your normal activities. ? Return to work or school. ? Resume sexual activity. Eating and drinking  Eat a heart-healthy diet. This should include plenty of fresh fruits and vegetables, whole grains, low-fat dairy products, and lean protein like chicken and fish.  Limit alcohol intake to no more than 1 drink a day for non-pregnant women and 2 drinks a day for men. One drink equals 12 oz of beer, 5 oz of wine, or 1 oz of hard liquor.  Check ingredients and nutrition facts on packaged foods and beverages. Avoid the following types of food: ? Food that is high in salt (sodium). ? Food that is high in saturated fat, like full-fat dairy or red meat. ? Food that is high in trans fat, like fried food. ? Food and drinks that are high in sugar. Lifestyle  Do not use any products that contain nicotine or tobacco, such as cigarettes and e-cigarettes. If you need help quitting, ask  your health care provider.  Take steps to manage and control your weight.  Get regular exercise. Aim for 150 minutes of moderate-intensity exercise (such as walking or yoga) or 75 minutes of vigorous exercise (such as running or swimming) each week.  Manage other health problems, such as diabetes or high blood pressure. Ask your health care provider how you can manage these conditions. General instructions  Do not drive for 24 hours after your procedure if you were given a medicine to help you relax (sedative).  Take over-the-counter and  prescription medicines only as told by your health care provider.  Avoid putting pressure on the area where the pacemaker was placed.  If you need an MRI after your pacemaker has been placed, be sure to tell the health care provider who orders the MRI that you have a pacemaker.  Avoid close and prolonged exposure to electrical devices that have strong magnetic fields. These include: ? Cell phones. Avoid keeping them in a pocket near the pacemaker, and try using the ear opposite the pacemaker. ? MP3 players. ? Household appliances, like microwaves. ? Metal detectors. ? Electric generators. ? High-tension wires.  Keep all follow-up visits as directed by your health care provider. This is important. Contact a health care provider if:  You have pain at the incision site that is not relieved by over-the-counter or prescription medicines.  You have any of these around your incision site or coming from it: ? More redness, swelling, or pain. ? Fluid or blood. ? Warmth to the touch. ? Pus or a bad smell.  You have a fever.  You feel brief, occasional palpitations, light-headedness, or any symptoms that you think might be related to your heart. Get help right away if:  You experience chest pain that is different from the pain at the pacemaker site.  You develop a red streak that extends above or below the incision site.  You experience shortness of breath.  You have palpitations or an irregular heartbeat.  You have light-headedness that does not go away quickly.  You faint or have dizzy spells.  Your pulse suddenly drops or increases rapidly and does not return to normal.  You begin to gain weight and your legs and ankles swell. Summary  After your procedure, it is common to have pain, soreness, and some swelling where the pacemaker was inserted.  Make sure to keep your incision clean and dry. Follow instructions from your health care provider about how to take care of your  incision.  Check your incision every day for signs of infection, such as more pain or swelling, pus or a bad smell, warmth, or leaking fluid and blood.  Avoid strenuous exercise and lifting your left arm higher than your shoulder for 2 weeks, or as long as told by your health care provider. This information is not intended to replace advice given to you by your health care provider. Make sure you discuss any questions you have with your health care provider. Document Released: 03/29/2013 Document Revised: 05/21/2017 Document Reviewed: 04/30/2016 Elsevier Patient Education  2020 [employer].

## 2019-02-08 NOTE — Interval H&P Note (Signed)
History and Physical Interval Note:  02/08/2019 2:01 PM  [name]  has presented today for surgery, with the diagnosis of ERI.  The various methods of treatment have been discussed with the patient and family. After consideration of risks, benefits and other options for treatment, the patient has consented to  Procedure(s): [place] (N/A) as a surgical intervention.  The patient's history has been reviewed, patient examined, no change in status, stable for surgery.  I have reviewed the patient's chart and labs.  Questions were answered to the patient's satisfaction.    PT SEEN and examined  Procedure reviewed Generator replacement of CRT-P   Third procedure will use Aegis antimicrobial pouch  We have reviewed the benefits and risks of generator replacement.  These include but are not limited to lead fracture and infection.  The patient understands, agrees and is willing to proceed.     Virl Axe

## 2019-02-08 NOTE — Progress Notes (Signed)
Patients husband states she does not take Eliquis anymore, this was confirmed with their son Shanon Brow. Dr Caryl Comes was informed and I was told to inform the patient/family that further word would be given from the office tomorrow in regards to Eliquis, family verbalized understanding.

## 2019-02-09 ENCOUNTER — Telehealth: Payer: Self-pay | Admitting: Internal Medicine

## 2019-02-09 ENCOUNTER — Encounter (HOSPITAL_COMMUNITY): Payer: Self-pay | Admitting: Internal Medicine

## 2019-02-09 MED FILL — Lidocaine HCl Local Inj 1%: INTRAMUSCULAR | Qty: 60 | Status: AC

## 2019-02-09 NOTE — Telephone Encounter (Signed)
Spoke with patient's husband. He reports that pt is stating her incision "hurts a lot," s/p gen change on 02/08/19. Took 1 extra-strength acetaminophen (he is unsure of strength), may have helped some. No drainage or redness noted at site, Dermabond in place, some swelling noted, but "it's not a lot," site not completely firm to palpation. Explained that some swelling and tenderness is to be expected in the first few days-weeks after procedure. Pt and husband are unsure if site is more swollen or firm compared with yesterday. Discussed possibility of hematoma--confirmed pt is not taking apixaban.  Encouraged pt and husband to continue to monitor site overnight. If drainage, redness, worsened swelling, or fever/chills are noted, call the DC in the morning and we can schedule her for an appointment while an EP MD is in the office. Plan to continue taking PRN acetaminophen per instructions on bottle. Pt and husband are in agreement with this plan. No additional questions or concerns at this time.

## 2019-02-09 NOTE — Telephone Encounter (Signed)
New message  Spouse calling to report pain at incision site from 8/19 procedure. Has given Tylenol only Please call    1. Has your device fired? NO  2. Is you device beeping? NO 3. Are you experiencing draining or swelling at device site? PAIN  4. Are you calling to see if we received your device transmission? NO  5. Have you passed out? NO    Please route to [place]

## 2019-02-10 ENCOUNTER — Ambulatory Visit (INDEPENDENT_AMBULATORY_CARE_PROVIDER_SITE_OTHER): Payer: Medicare Other | Admitting: *Deleted

## 2019-02-10 ENCOUNTER — Other Ambulatory Visit: Payer: Self-pay

## 2019-02-10 DIAGNOSIS — Z950 Presence of cardiac pacemaker: Secondary | ICD-10-CM

## 2019-02-10 DIAGNOSIS — I442 Atrioventricular block, complete: Secondary | ICD-10-CM

## 2019-02-10 NOTE — Patient Instructions (Addendum)
Take 1 extra-strength Tylenol (acetaminophen) 500mg  tablet every 6 hours through the weekend. Do not take more than 3000mg  (3g) of Tylenol in 24 hours. Call the [facility] at [phone] on Monday if pain is not improved, or if you notice any drainage, redness, swelling, fever, or chills.  If pain is worse over the weekend, you can call [phone] for recommendations from the on-call provider.

## 2019-02-10 NOTE — Progress Notes (Signed)
Add-on wound check for continued pain at device site, s/p CRT-P generator replacement on 02/08/19. At time of phone call today at 11:46, pt reported continued pain and swelling at site despite having taken two extra-strength acetaminophen around 11:00. At time of visit today, no swelling, drainage, or redness are noted by this RN. No fever/chills. Site is tender to palpation, but pain and swelling have markedly improved in the past few hours per pt and husband. Incision edges appear fully approximated, Dermabond in place. Pt no longer taking apixaban due to frequent falls per 01/31/19 OV note from M. Tillery, PA, confirmed with husband again today.  Chanetta Marshall, NP, assessed site. No appreciable signs/symptoms of infection or bleeding. Recommended acetaminophen 500mg  every 6 hours through the weekend. Plan to call on Monday if pain is not improved, or if any signs/symptoms of infection are noted. Pt and husband are in agreement with plan. No further questions at this time.

## 2019-02-10 NOTE — Telephone Encounter (Signed)
Spoke with patient's husband, who reports that site is more swollen this morning. Pain is "excruciating," despite acetaminophen. Pt's husband agreeable to DC appointment this afternoon at 2:00pm. He will have to assist pt due to her mobility issues and memory loss. Aware of office address and aware to wear their own masks. No further questions at this time.

## 2019-02-11 NOTE — Telephone Encounter (Signed)
Noted  

## 2019-02-13 ENCOUNTER — Telehealth: Payer: Self-pay | Admitting: Cardiology

## 2019-02-13 NOTE — Telephone Encounter (Signed)
Patient husband called back and stated that device incision site is a little better, still has some swelling, no bandage over it, no fever, a little red but a little dark, seems to be healing fine according to patient husband. Informed pt that I would send a note to Device Tech RN and someone will review the note any additional questions a Device Tech RN will call back. Pt husband verbalized understanding.

## 2019-02-13 NOTE — Telephone Encounter (Signed)
Attempted to return call--phone rang continuously, no answer, no answering machine came on. Will try again later.

## 2019-02-13 NOTE — Telephone Encounter (Signed)
Spoke with patient's caregiver. Pt's husband is currently at an appointment. Caregiver reports site seems to be healing well, pt reporting less pain. Requested that she let pt's husband know I called and requested that he call back if any questions or concerns. Caregiver aware and in agreement with plan.

## 2019-02-23 ENCOUNTER — Telehealth (INDEPENDENT_AMBULATORY_CARE_PROVIDER_SITE_OTHER): Payer: Medicare Other | Admitting: *Deleted

## 2019-02-23 ENCOUNTER — Other Ambulatory Visit: Payer: Self-pay

## 2019-02-23 DIAGNOSIS — I442 Atrioventricular block, complete: Secondary | ICD-10-CM

## 2019-02-23 LAB — CUP PACEART INCLINIC DEVICE CHECK
Date Time Interrogation Session: 20200903175554
Implantable Lead Implant Date: 20070720
Implantable Lead Implant Date: 20070720
Implantable Lead Implant Date: 20140602
Implantable Lead Location: 753858
Implantable Lead Location: 753859
Implantable Lead Location: 753860
Implantable Lead Model: 4092
Implantable Lead Model: 5076
Implantable Pulse Generator Implant Date: 20200819
Pulse Gen Serial Number: 9141456

## 2019-02-23 NOTE — Progress Notes (Signed)
Virtual wound check . Picture sent through my chart. Wound edges approximated. No edema, redness, or drainage. Reviewed s/sx of infection with patient and family per DPR. Remote monitoring scheduled for every three months, next remote scheduled for 05/15/19 and f/u with Dr Caryl Comes 05/15/19.

## 2019-03-02 DIAGNOSIS — G20 Parkinson's disease: Secondary | ICD-10-CM | POA: Diagnosis not present

## 2019-03-02 DIAGNOSIS — R42 Dizziness and giddiness: Secondary | ICD-10-CM

## 2019-03-02 DIAGNOSIS — G309 Alzheimer's disease, unspecified: Secondary | ICD-10-CM | POA: Diagnosis not present

## 2019-03-02 DIAGNOSIS — R531 Weakness: Secondary | ICD-10-CM

## 2019-03-02 DIAGNOSIS — I10 Essential (primary) hypertension: Secondary | ICD-10-CM

## 2019-03-03 DIAGNOSIS — R531 Weakness: Secondary | ICD-10-CM | POA: Diagnosis not present

## 2019-03-03 DIAGNOSIS — G309 Alzheimer's disease, unspecified: Secondary | ICD-10-CM | POA: Diagnosis not present

## 2019-03-03 DIAGNOSIS — G20 Parkinson's disease: Secondary | ICD-10-CM | POA: Diagnosis not present

## 2019-03-03 DIAGNOSIS — R42 Dizziness and giddiness: Secondary | ICD-10-CM | POA: Diagnosis not present

## 2019-03-06 ENCOUNTER — Telehealth: Payer: Self-pay

## 2019-03-06 ENCOUNTER — Ambulatory Visit (INDEPENDENT_AMBULATORY_CARE_PROVIDER_SITE_OTHER): Payer: Medicare Other | Admitting: *Deleted

## 2019-03-06 DIAGNOSIS — I480 Paroxysmal atrial fibrillation: Secondary | ICD-10-CM

## 2019-03-06 DIAGNOSIS — I442 Atrioventricular block, complete: Secondary | ICD-10-CM

## 2019-03-06 NOTE — Telephone Encounter (Signed)
Pt husband states the pt has swelling on her device site. The states the site slightly dark.

## 2019-03-06 NOTE — Telephone Encounter (Signed)
Husband reports that he thought ppm site was swollen and looked different when she was laying flat and stated when patient sat up in bed device site looked the same as yesterday and there was no swelling or drainage. Apologized for calling and needs no appointment or instructions.

## 2019-03-13 LAB — CUP PACEART REMOTE DEVICE CHECK
Battery Remaining Longevity: 62 mo
Battery Remaining Percentage: 95.5 %
Battery Voltage: 2.99 V
Brady Statistic AP VP Percent: 83 %
Brady Statistic AP VS Percent: 1 %
Brady Statistic AS VP Percent: 17 %
Brady Statistic AS VS Percent: 1 %
Brady Statistic RA Percent Paced: 75 %
Date Time Interrogation Session: 20200915010537
Implantable Lead Implant Date: 20070720
Implantable Lead Implant Date: 20070720
Implantable Lead Implant Date: 20140602
Implantable Lead Location: 753858
Implantable Lead Location: 753859
Implantable Lead Location: 753860
Implantable Lead Model: 4092
Implantable Lead Model: 5076
Implantable Pulse Generator Implant Date: 20200819
Lead Channel Impedance Value: 410 Ohm
Lead Channel Impedance Value: 550 Ohm
Lead Channel Impedance Value: 740 Ohm
Lead Channel Pacing Threshold Amplitude: 0.5 V
Lead Channel Pacing Threshold Amplitude: 0.75 V
Lead Channel Pacing Threshold Amplitude: 2.25 V
Lead Channel Pacing Threshold Pulse Width: 0.4 ms
Lead Channel Pacing Threshold Pulse Width: 0.5 ms
Lead Channel Pacing Threshold Pulse Width: 1 ms
Lead Channel Sensing Intrinsic Amplitude: 2.6 mV
Lead Channel Setting Pacing Amplitude: 2 V
Lead Channel Setting Pacing Amplitude: 2.5 V
Lead Channel Setting Pacing Amplitude: 3.25 V
Lead Channel Setting Pacing Pulse Width: 0.5 ms
Lead Channel Setting Pacing Pulse Width: 1 ms
Lead Channel Setting Sensing Sensitivity: 5 mV
Pulse Gen Serial Number: 9141456

## 2019-03-20 NOTE — Addendum Note (Signed)
Addended by: Douglass Rivers D on: 03/20/2019 11:53 AM   Modules accepted: Level of Service

## 2019-03-20 NOTE — Progress Notes (Signed)
Remote pacemaker transmission.   

## 2019-04-04 DIAGNOSIS — R262 Difficulty in walking, not elsewhere classified: Secondary | ICD-10-CM

## 2019-04-04 DIAGNOSIS — N179 Acute kidney failure, unspecified: Secondary | ICD-10-CM

## 2019-04-04 DIAGNOSIS — I4891 Unspecified atrial fibrillation: Secondary | ICD-10-CM

## 2019-04-04 DIAGNOSIS — I10 Essential (primary) hypertension: Secondary | ICD-10-CM

## 2019-04-04 DIAGNOSIS — I255 Ischemic cardiomyopathy: Secondary | ICD-10-CM

## 2019-04-04 DIAGNOSIS — I63521 Cerebral infarction due to unspecified occlusion or stenosis of right anterior cerebral artery: Secondary | ICD-10-CM | POA: Diagnosis not present

## 2019-04-04 DIAGNOSIS — R0789 Other chest pain: Secondary | ICD-10-CM

## 2019-04-05 DIAGNOSIS — I255 Ischemic cardiomyopathy: Secondary | ICD-10-CM | POA: Diagnosis not present

## 2019-04-05 DIAGNOSIS — N179 Acute kidney failure, unspecified: Secondary | ICD-10-CM | POA: Diagnosis not present

## 2019-04-05 DIAGNOSIS — R0789 Other chest pain: Secondary | ICD-10-CM | POA: Diagnosis not present

## 2019-04-05 DIAGNOSIS — I63521 Cerebral infarction due to unspecified occlusion or stenosis of right anterior cerebral artery: Secondary | ICD-10-CM | POA: Diagnosis not present

## 2019-04-06 DIAGNOSIS — R0789 Other chest pain: Secondary | ICD-10-CM | POA: Diagnosis not present

## 2019-04-06 DIAGNOSIS — I63521 Cerebral infarction due to unspecified occlusion or stenosis of right anterior cerebral artery: Secondary | ICD-10-CM | POA: Diagnosis not present

## 2019-04-06 DIAGNOSIS — N179 Acute kidney failure, unspecified: Secondary | ICD-10-CM | POA: Diagnosis not present

## 2019-04-06 DIAGNOSIS — I255 Ischemic cardiomyopathy: Secondary | ICD-10-CM | POA: Diagnosis not present

## 2019-05-05 ENCOUNTER — Encounter: Payer: Self-pay | Admitting: Cardiology

## 2019-05-05 NOTE — Progress Notes (Signed)
Dictation on: 05/05/2019  7:09 PM by: Shirlee More [id number]

## 2019-05-06 DIAGNOSIS — R079 Chest pain, unspecified: Secondary | ICD-10-CM | POA: Diagnosis not present

## 2019-05-06 NOTE — Progress Notes (Signed)
Dictation on: 05/06/2019  9:31 AM by: Shirlee More [id number]

## 2019-05-15 ENCOUNTER — Ambulatory Visit (INDEPENDENT_AMBULATORY_CARE_PROVIDER_SITE_OTHER): Payer: Medicare Other | Admitting: *Deleted

## 2019-05-15 ENCOUNTER — Encounter: Payer: Medicare Other | Admitting: Internal Medicine

## 2019-05-15 DIAGNOSIS — I442 Atrioventricular block, complete: Secondary | ICD-10-CM

## 2019-05-17 LAB — CUP PACEART REMOTE DEVICE CHECK
Battery Remaining Longevity: 67 mo
Battery Remaining Percentage: 95.5 %
Battery Voltage: 2.98 V
Brady Statistic AP VP Percent: 77 %
Brady Statistic AP VS Percent: 1 %
Brady Statistic AS VP Percent: 21 %
Brady Statistic AS VS Percent: 1 %
Brady Statistic RA Percent Paced: 62 %
Date Time Interrogation Session: 20201122063436
Implantable Lead Implant Date: 20070720
Implantable Lead Implant Date: 20070720
Implantable Lead Implant Date: 20140602
Implantable Lead Location: 753858
Implantable Lead Location: 753859
Implantable Lead Location: 753860
Implantable Lead Model: 4092
Implantable Lead Model: 5076
Implantable Pulse Generator Implant Date: 20200819
Lead Channel Impedance Value: 400 Ohm
Lead Channel Impedance Value: 530 Ohm
Lead Channel Impedance Value: 890 Ohm
Lead Channel Pacing Threshold Amplitude: 0.5 V
Lead Channel Pacing Threshold Amplitude: 0.75 V
Lead Channel Pacing Threshold Amplitude: 2.125 V
Lead Channel Pacing Threshold Pulse Width: 0.4 ms
Lead Channel Pacing Threshold Pulse Width: 0.5 ms
Lead Channel Pacing Threshold Pulse Width: 1 ms
Lead Channel Sensing Intrinsic Amplitude: 12 mV
Lead Channel Sensing Intrinsic Amplitude: 2.8 mV
Lead Channel Setting Pacing Amplitude: 2 V
Lead Channel Setting Pacing Amplitude: 2.5 V
Lead Channel Setting Pacing Amplitude: 3.125
Lead Channel Setting Pacing Pulse Width: 0.5 ms
Lead Channel Setting Pacing Pulse Width: 1 ms
Lead Channel Setting Sensing Sensitivity: 5 mV
Pulse Gen Serial Number: 9141456

## 2019-05-29 ENCOUNTER — Telehealth: Payer: Self-pay | Admitting: Internal Medicine

## 2019-05-29 NOTE — Telephone Encounter (Signed)
Virtual visit with Dr. Caryl Comes is appropriate for 91 day f/u s/p gen change. Routed to EP scheduler for further assistance.

## 2019-05-29 NOTE — Telephone Encounter (Signed)
New message    Son Merry Proud only wants a virtual visit for patient. He would like to know if Dr Caryl Comes or APP feel coming in the office is necessary for patient. Appointment for 12/9 postponed. Merry Proud states his mother is doing well, no issues with device site, but wants confirmation that an in office appointment is needed or not.

## 2019-05-31 ENCOUNTER — Encounter: Payer: Medicare Other | Admitting: Student

## 2019-06-11 NOTE — Addendum Note (Signed)
Addended by: Chanetta Marshall K on: 06/11/2019 09:09 PM   Modules accepted: Level of Service

## 2019-06-22 ENCOUNTER — Telehealth: Payer: Medicare Other | Admitting: Internal Medicine

## 2019-07-19 ENCOUNTER — Encounter: Payer: Self-pay | Admitting: Internal Medicine

## 2019-07-19 ENCOUNTER — Other Ambulatory Visit: Payer: Self-pay

## 2019-07-19 ENCOUNTER — Telehealth (INDEPENDENT_AMBULATORY_CARE_PROVIDER_SITE_OTHER): Payer: Medicare Other | Admitting: Internal Medicine

## 2019-07-19 VITALS — BP 154/81 | HR 60 | Ht 64.0 in | Wt 127.0 lb

## 2019-07-19 DIAGNOSIS — I255 Ischemic cardiomyopathy: Secondary | ICD-10-CM | POA: Diagnosis not present

## 2019-07-19 DIAGNOSIS — Z950 Presence of cardiac pacemaker: Secondary | ICD-10-CM | POA: Diagnosis not present

## 2019-07-19 DIAGNOSIS — I480 Paroxysmal atrial fibrillation: Secondary | ICD-10-CM

## 2019-07-19 DIAGNOSIS — I442 Atrioventricular block, complete: Secondary | ICD-10-CM | POA: Diagnosis not present

## 2019-07-19 NOTE — Progress Notes (Signed)
Electrophysiology TeleHealth Note   Due to national recommendations of social distancing due to COVID 19, an audio/video telehealth visit is felt to be most appropriate for this patient at this time.  See MyChart message from today for the patient's consent to telehealth for [facility].   Date:  07/19/2019   ID:  [name], DOB July 12, 1930, MRN [id number]  Location: patient's home  Provider location: [address] [place]  Evaluation Performed: Follow-up visit  PCP:  Myrlene Broker, MD    Chief Complaint: afib   History of Present Illness:    [name] is a 86 y.o. female who presents via audio/video conferencing for a telehealth visit today.  Since last being seen in our clinic ICM with remote CABG and MVR, most recent cath 2014 patent grafts and normalized EF; echo , the patient reports doing well.  Denies bleeding edema palpitations.  Dyspnea on exertion is stable.  She is working with a physical therapist MD, not withstanding 2 strokes, was able to dance yesterday.  No bleeding.  Interval blood work but we do not have access to it.   DATE TEST EF   12/14 LHC  60-65 % Grafts patent,   4/18 Myoview 52% No perfusion defects  7/18 Echo   65 %          Date Cr K Hgb  4/19 1.34 4.5 13.4   8/20 1.37 4.4 11.9           Past Medical History:  Diagnosis Date  . Anemia 1940's  . Anginal pain ([place])   . CHF (congestive heart failure) (H[facility])    hx  . Chronic anticoagulation    apixaban  . Chronic systolic heart failure ([place]) 10/11/2012  . Complete heart block ([place]) 10/11/2012  . Coronary artery disease   . Depression   . GERD (gastroesophageal reflux disease)   . H/O hiatal hernia   . History of blood transfusion    "related to uretheral OR & when 1st child was born"  . Hyperlipidemia   . Hypertension   . Hypothyroidism   . Ischemic cardiomyopathy- s/p CABG 10/11/2012   EF 39%  . Migraine    ~ qd for 11 months now (05/23/2014)  . OSA on  CPAP 10/11/2012   No longer using CPAP due to worsening of Migraines. AHI was .45/hr at 9 cm water pressure. RDI was 0.4/hr. Patient was unable to reach REM sleep and maintain the supine position at optimal pressure. Patient was able to sleep for prolonged periods of time at 9 cm water pressure without suffering further from respiratory events.  [name] 10/11/2012   Updated to BiV  . Parkinson's disease ([place])   . Shortness of breath   . Stress incontinence    multiple injections of collagen  . Thrombocytopenia ([place])   . Weakness 10/06/12    Past Surgical History:  Procedure Laterality Date  . APPENDECTOMY    . BI-VENTRICULAR PACEMAKER REVISION (CRT-R)  11/21/2012   BIV   UPGRADE    WITH DR Caryl Comes  . BI-VENTRICULAR PACEMAKER UPGRADE N/A 10/31/2012   Procedure: BI-VENTRICULAR PACEMAKER UPGRADE;  Surgeon: Deboraha Sprang, MD;  Location: [facility] CATH LAB;  Service: Cardiovascular;  Laterality: N/A;  . BI-VENTRICULAR PACEMAKER UPGRADE N/A 11/21/2012   Procedure: BI-VENTRICULAR PACEMAKER UPGRADE;  Surgeon: Deboraha Sprang, MD;  Location: [facility] CATH LAB;  Service: Cardiovascular;  Laterality: N/A;  . [place] N/A 02/08/2019  Procedure: BIV PACEMAKER GENERATOR CHANGEOUT;  Surgeon: Deboraha Sprang, MD;  Location: [place] CV LAB;  Service: Cardiovascular;  Laterality: N/A;  . CARDIAC CATHETERIZATION  05/2012   (1st cath 2012) This required recatheterization by Dr. Claiborne Billings and had a patent LIMA-LAD with distal diffuse disease, patent SVG-OM, 10% to 20% mid right coronary but an EF of about 35%, quantitatively iw was 38.9.  [name] CARDIAC CATHETERIZATION     "today;s makes 27" (05/23/2014)  . CARDIAC VALVE REPLACEMENT    . CATARACT EXTRACTION, BILATERAL Bilateral   . CHOLECYSTECTOMY    . COLONOSCOPY WITH PROPOFOL N/A 09/22/2016   Procedure: COLONOSCOPY WITH PROPOFOL;  Surgeon: Mauri Pole, MD;  Location: [place] [facility] ENDOSCOPY;  Service: Endoscopy;  Laterality: N/A;  . CORONARY  ANGIOPLASTY WITH STENT PLACEMENT  2012   No significant CAD, patent but ectatic graft supplying the ramus, patent LIMA-LAD, and an EF of 50% at that time. She had an occluded stent to her circumflex from remote intervention. Required recatheterization 05/2012 by Dr. Claiborne Billings and had a patent LIMA-LAD with distal diffuse disease, patent SVG-OM, 10%-20% mid right coronary but an EF of about 35%, quantitatively it was 38.9.  . CORONARY ANGIOPLASTY WITH STENT PLACEMENT     "I have a tota of 4" (05/23/2014)  . CORONARY ARTERY BYPASS GRAFT  2003   With LIMA-LAD and vein graft to OM, and associated mitral valve repair with anuloplasty ring and quadrangular resection of the posterior leaflet, 24 mm [place] ring by Dr. Roxy Manns.  . ESOPHAGOGASTRODUODENOSCOPY N/A 09/21/2016   Procedure: ESOPHAGOGASTRODUODENOSCOPY (EGD);  Surgeon: Mauri Pole, MD;  Location: [facility] ENDOSCOPY;  Service: Endoscopy;  Laterality: N/A;  . INCONTINENCE SURGERY    . INSERT / REPLACE / REMOVE PACEMAKER  2007  . LEFT HEART CATHETERIZATION WITH CORONARY ANGIOGRAM N/A 06/07/2013   Procedure: LEFT HEART CATHETERIZATION WITH CORONARY ANGIOGRAM;  Surgeon: Lorretta Harp, MD;  Location: [facility] CATH LAB;  Service: Cardiovascular;  Laterality: N/A;  . LEFT HEART CATHETERIZATION WITH CORONARY/GRAFT ANGIOGRAM N/A 05/23/2012   Procedure: LEFT HEART CATHETERIZATION WITH Beatrix Fetters;  Surgeon: Troy Sine, MD;  Location: [facility] CATH LAB;  Service: Cardiovascular;  Laterality: N/A;  . LEFT HEART CATHETERIZATION WITH CORONARY/GRAFT ANGIOGRAM N/A 05/23/2014   Procedure: LEFT HEART CATHETERIZATION WITH Beatrix Fetters;  Surgeon: Wellington Hampshire, MD;  Location: [place] CATH LAB;  Service: Cardiovascular;  Laterality: N/A;  . lexiscan myocardial stress test  08/20/11   The EF = 34%. This is a low risk scan. There is a new fix abnormality in the distal LAD artery territory. Although no reversibility is seen, high grade obstruction with resting  ischemia cannot be excluded.  [name] PACEMAKER GENERATOR CHANGE    . US ECHOCARDIOGRAPHY  04/13/12   EF - 25% to 35% with intraventricular dyssynchrony, RVSP of 46, moderate TR, and mild to moderate AI. She had a mild increased mitral valve gradient of 5 mmHg with minimal MR.   [name] VAGINAL HYSTERECTOMY      Current Outpatient Medications  Medication Sig Dispense Refill  . amLODipine (NORVASC) 5 MG tablet Take 1 tablet by mouth daily.    . busPIRone (BUSPAR) 10 MG tablet Take 10 mg by mouth 2 (two) times daily.     . carbidopa-levodopa (SINEMET IR) 25-100 MG tablet Take 1.5 tablets by mouth 3 (three) times daily.     . clopidogrel (PLAVIX) 75 MG tablet Take 1 tablet by mouth daily.    [name] donepezil (ARICEPT) 10 MG tablet Take 10 mg by  mouth at bedtime.    . folic acid (FOLVITE) 1 MG tablet Take 1 tablet by mouth daily.    . furosemide (LASIX) 20 MG tablet Take 1 tablet (20 mg total) by mouth daily. PLEASE CONTACT OFFICE FOR ADDITIONAL REFILLS 15 tablet 0  . irbesartan (AVAPRO) 300 MG tablet TAKE ONE (1) TABLET ONCE DAILY 30 tablet 8  . nebivolol (BYSTOLIC) 5 MG tablet Take 1 tablet (5 mg total) by mouth at bedtime. Please schedule an appointment with Dr Claiborne Billings for future refills. 30 tablet 9  . NITROSTAT 0.4 MG SL tablet PLACE 1 TAB UNDER TONGUE EVERY 5 MINUTES UP TO 3 DOSES FOR CHEST PAIN 25 tablet 2  . ondansetron (ZOFRAN-ODT) 8 MG disintegrating tablet Take 1 tablet by mouth as needed for nausea.    . pantoprazole (PROTONIX) 20 MG tablet Take 20 mg by mouth daily.     [name] PARoxetine (PAXIL) 30 MG tablet Take 30 mg by mouth every evening.     No current facility-administered medications for this visit.    Allergies:   Buprenorphine hcl, Dilaudid [hydromorphone hcl], Morphine and related, Ramipril, Sulfur, Sulfa antibiotics, and Sulfasalazine   Social History:  The patient  reports that she has never smoked. She has never used smokeless tobacco. She reports that she does not drink alcohol or use  drugs.   Family History:  The patient's   family history includes Cancer in her father.   ROS:  Please see the history of present illness.   All other systems are personally reviewed and negative.    Exam:    Vital Signs:  BP (!) 154/81   Pulse 60   Ht 5\' 4"  (1.626 m)   Wt 127 lb (57.6 kg)   BMI 21.80 kg/m      Labs/Other Tests and Data Reviewed:    Recent Labs: 01/31/2019: BUN 17; Creatinine, Ser 1.37; Hemoglobin 11.9; Platelets 153; Potassium 4.4; Sodium 140   Wt Readings from Last 3 Encounters:  07/19/19 127 lb (57.6 kg)  02/08/19 127 lb (57.6 kg)  01/31/19 127 lb (57.6 kg)     Other studies personally reviewed:   Last device remote is reviewed from [employer] PDF dated 11/20 which reveals normal device function,   arrhythmias -atrial fibrillation-persistent   ASSESSMENT & PLAN:   Coronary disease with prior bypass surgery  Atrial fibrillation detected on her device-persistent  Sick sinus syndrome  Pacemaker-St. JudeThe patient's device was interrogated. The information was reviewed. No changes were made in the programming.   Congestive heart failure-chronic-diastolic    Fluid status seems euvolemic by report  Atrial fibrillation is persistent but unassociated with change in functional status.  Heart rates are controlled.  We will not do anything about this.  Except to continue anticoagulation.  No bleeding.  No symptoms of ischemia.  COVID 19 screen The patient denies symptoms of COVID 19 at this time.  The importance of social distancing was discussed today.  Follow-up:  21 m Next remote: As Scheduled   Current medicines are reviewed at length with the patient today.   The patient does not have concerns regarding her medicines.  The following changes were made today:  none  Labs/ tests ordered today include:   No orders of the defined types were placed in this encounter.   Future tests ( post COVID )    Patient Risk:  after full review of  this patients clinical status, I feel that they are at moderate risk at this time.  Today,  I have spent 8 minutes with the patient with telehealth technology discussing the above.  Signed, Virl Axe, MD  07/19/2019 10:34 AM     [address] [place] [address] [phone] (office) [phone] (fax)

## 2019-07-26 ENCOUNTER — Telehealth: Payer: Self-pay | Admitting: Cardiovascular Disease

## 2019-07-26 NOTE — Telephone Encounter (Signed)
New message   The patient wants to see if she can be switched from nebivolol (BYSTOLIC) 5 MG tablet to another betablocker because of cost of the medication. Please call the pharmacy.

## 2019-07-27 NOTE — Telephone Encounter (Signed)
I have not seen [name] since 2016.  She had just seen Dr. Adam Phenix in January 2021 who follows her pacemaker.  I would defer to him but if it is okay with him she can switch to low-dose metoprolol succinate

## 2019-07-28 NOTE — Telephone Encounter (Signed)
Spoke with pt and she asked that I speak with her husband.  Husband states Nebivolol is now up to $180 a month.  He is unaware of pt ever being another beta blocker.  Advised I will send to Dr. Caryl Comes for review.  Husband states if Dr. Caryl Comes feels she needs to stay on Nebivolol, then they will "bite the bullet" and continue it.

## 2019-07-28 NOTE — Telephone Encounter (Signed)
Message routed to Dr. Caryl Comes & [facility] Triage, per Dr. Claiborne Billings

## 2019-07-31 NOTE — Telephone Encounter (Signed)
J  good morning   she was on nebivolol when she first got seen by [place] in 2014 We can try anything Why dont we start with bisoprolol2.5 mg daily   Thanks SK

## 2019-08-01 NOTE — Telephone Encounter (Signed)
Attempted phone call, spoke with female who states pt and husband are in bed.  Requested she have pt's husband contact RN at [phone].  Pt has dementia.

## 2019-08-02 ENCOUNTER — Telehealth: Payer: Self-pay

## 2019-08-02 MED ORDER — BISOPROLOL FUMARATE 5 MG PO TABS: 2.5000 mg | ORAL_TABLET | Freq: Every day | ORAL | 3 refills | Status: DC

## 2019-08-02 NOTE — Telephone Encounter (Signed)
Spoke with pt's husband, Juanda Crumble ([place] on file) and advised per Dr Caryl Comes, pt can try Bisoprolol 2.5mg .  Order placed for Bisoprolol 5mg .  Take 2.5mg  (1/2 tablet by mouth daily) #45 with 3 refills.  Pt's husband verbalizes understanding and agrees with current plan.

## 2019-08-02 NOTE — Telephone Encounter (Signed)
Opened in error

## 2019-08-14 ENCOUNTER — Ambulatory Visit (INDEPENDENT_AMBULATORY_CARE_PROVIDER_SITE_OTHER): Payer: Medicare Other | Admitting: *Deleted

## 2019-08-14 DIAGNOSIS — I442 Atrioventricular block, complete: Secondary | ICD-10-CM | POA: Diagnosis not present

## 2019-08-14 LAB — CUP PACEART REMOTE DEVICE CHECK
Battery Remaining Longevity: 61 mo
Battery Remaining Percentage: 95 %
Battery Voltage: 2.96 V
Brady Statistic AP VP Percent: 63 %
Brady Statistic AP VS Percent: 1 %
Brady Statistic AS VP Percent: 35 %
Brady Statistic AS VS Percent: 1 %
Brady Statistic RA Percent Paced: 51 %
Date Time Interrogation Session: 20210222031927
Implantable Lead Implant Date: 20070720
Implantable Lead Implant Date: 20070720
Implantable Lead Implant Date: 20140602
Implantable Lead Location: 753858
Implantable Lead Location: 753859
Implantable Lead Location: 753860
Implantable Lead Model: 4092
Implantable Lead Model: 5076
Implantable Pulse Generator Implant Date: 20200819
Lead Channel Impedance Value: 410 Ohm
Lead Channel Impedance Value: 540 Ohm
Lead Channel Impedance Value: 840 Ohm
Lead Channel Pacing Threshold Amplitude: 0.5 V
Lead Channel Pacing Threshold Amplitude: 0.75 V
Lead Channel Pacing Threshold Amplitude: 2.375 V
Lead Channel Pacing Threshold Pulse Width: 0.4 ms
Lead Channel Pacing Threshold Pulse Width: 0.5 ms
Lead Channel Pacing Threshold Pulse Width: 1 ms
Lead Channel Sensing Intrinsic Amplitude: 12 mV
Lead Channel Sensing Intrinsic Amplitude: 3.2 mV
Lead Channel Setting Pacing Amplitude: 2 V
Lead Channel Setting Pacing Amplitude: 2.5 V
Lead Channel Setting Pacing Amplitude: 3.375
Lead Channel Setting Pacing Pulse Width: 0.5 ms
Lead Channel Setting Pacing Pulse Width: 1 ms
Lead Channel Setting Sensing Sensitivity: 5 mV
Pulse Gen Model: 3562
Pulse Gen Serial Number: 9141456

## 2019-08-15 NOTE — Progress Notes (Signed)
PPM Remote  

## 2019-08-18 DIAGNOSIS — I63521 Cerebral infarction due to unspecified occlusion or stenosis of right anterior cerebral artery: Secondary | ICD-10-CM | POA: Diagnosis not present

## 2019-08-18 DIAGNOSIS — R55 Syncope and collapse: Secondary | ICD-10-CM | POA: Diagnosis not present

## 2019-08-18 DIAGNOSIS — I10 Essential (primary) hypertension: Secondary | ICD-10-CM

## 2019-08-18 DIAGNOSIS — E785 Hyperlipidemia, unspecified: Secondary | ICD-10-CM | POA: Diagnosis not present

## 2019-08-18 DIAGNOSIS — G309 Alzheimer's disease, unspecified: Secondary | ICD-10-CM | POA: Diagnosis not present

## 2019-08-19 DIAGNOSIS — E785 Hyperlipidemia, unspecified: Secondary | ICD-10-CM | POA: Diagnosis not present

## 2019-08-19 DIAGNOSIS — R55 Syncope and collapse: Secondary | ICD-10-CM | POA: Diagnosis not present

## 2019-08-19 DIAGNOSIS — G309 Alzheimer's disease, unspecified: Secondary | ICD-10-CM | POA: Diagnosis not present

## 2019-08-19 DIAGNOSIS — I63521 Cerebral infarction due to unspecified occlusion or stenosis of right anterior cerebral artery: Secondary | ICD-10-CM | POA: Diagnosis not present

## 2019-09-25 DIAGNOSIS — R079 Chest pain, unspecified: Secondary | ICD-10-CM | POA: Diagnosis not present

## 2019-09-25 DIAGNOSIS — I10 Essential (primary) hypertension: Secondary | ICD-10-CM | POA: Diagnosis not present

## 2019-09-25 DIAGNOSIS — N179 Acute kidney failure, unspecified: Secondary | ICD-10-CM | POA: Diagnosis not present

## 2019-09-25 DIAGNOSIS — I2510 Atherosclerotic heart disease of native coronary artery without angina pectoris: Secondary | ICD-10-CM | POA: Diagnosis not present

## 2019-11-13 ENCOUNTER — Ambulatory Visit (INDEPENDENT_AMBULATORY_CARE_PROVIDER_SITE_OTHER): Payer: Medicare Other | Admitting: *Deleted

## 2019-11-13 DIAGNOSIS — I442 Atrioventricular block, complete: Secondary | ICD-10-CM | POA: Diagnosis not present

## 2019-11-13 LAB — CUP PACEART REMOTE DEVICE CHECK
Battery Remaining Longevity: 58 mo
Battery Remaining Percentage: 92 %
Battery Voltage: 2.96 V
Brady Statistic AP VP Percent: 72 %
Brady Statistic AP VS Percent: 1 %
Brady Statistic AS VP Percent: 27 %
Brady Statistic AS VS Percent: 1 %
Brady Statistic RA Percent Paced: 54 %
Date Time Interrogation Session: 20210524030017
Implantable Lead Implant Date: 20070720
Implantable Lead Implant Date: 20070720
Implantable Lead Implant Date: 20140602
Implantable Lead Location: 753858
Implantable Lead Location: 753859
Implantable Lead Location: 753860
Implantable Lead Model: 4092
Implantable Lead Model: 5076
Implantable Pulse Generator Implant Date: 20200819
Lead Channel Impedance Value: 410 Ohm
Lead Channel Impedance Value: 540 Ohm
Lead Channel Impedance Value: 850 Ohm
Lead Channel Pacing Threshold Amplitude: 0.5 V
Lead Channel Pacing Threshold Amplitude: 0.75 V
Lead Channel Pacing Threshold Amplitude: 2.375 V
Lead Channel Pacing Threshold Pulse Width: 0.4 ms
Lead Channel Pacing Threshold Pulse Width: 0.5 ms
Lead Channel Pacing Threshold Pulse Width: 1 ms
Lead Channel Sensing Intrinsic Amplitude: 12 mV
Lead Channel Sensing Intrinsic Amplitude: 2.1 mV
Lead Channel Setting Pacing Amplitude: 2 V
Lead Channel Setting Pacing Amplitude: 2.5 V
Lead Channel Setting Pacing Amplitude: 3.375
Lead Channel Setting Pacing Pulse Width: 0.5 ms
Lead Channel Setting Pacing Pulse Width: 1 ms
Lead Channel Setting Sensing Sensitivity: 5 mV
Pulse Gen Model: 3562
Pulse Gen Serial Number: 9141456

## 2019-11-14 NOTE — Progress Notes (Signed)
Remote pacemaker transmission.   

## 2020-02-12 ENCOUNTER — Ambulatory Visit (INDEPENDENT_AMBULATORY_CARE_PROVIDER_SITE_OTHER): Payer: Medicare Other | Admitting: *Deleted

## 2020-02-12 DIAGNOSIS — I442 Atrioventricular block, complete: Secondary | ICD-10-CM | POA: Diagnosis not present

## 2020-02-16 LAB — CUP PACEART REMOTE DEVICE CHECK
Battery Remaining Longevity: 61 mo
Battery Remaining Percentage: 95.5 %
Battery Voltage: 2.96 V
Brady Statistic AP VP Percent: 77 %
Brady Statistic AP VS Percent: 1 %
Brady Statistic AS VP Percent: 22 %
Brady Statistic AS VS Percent: 1 %
Brady Statistic RA Percent Paced: 58 %
Date Time Interrogation Session: 20210827034121
Implantable Lead Implant Date: 20070720
Implantable Lead Implant Date: 20070720
Implantable Lead Implant Date: 20140602
Implantable Lead Location: 753858
Implantable Lead Location: 753859
Implantable Lead Location: 753860
Implantable Lead Model: 4092
Implantable Lead Model: 5076
Implantable Pulse Generator Implant Date: 20200819
Lead Channel Impedance Value: 410 Ohm
Lead Channel Impedance Value: 530 Ohm
Lead Channel Impedance Value: 810 Ohm
Lead Channel Pacing Threshold Amplitude: 0.5 V
Lead Channel Pacing Threshold Amplitude: 0.75 V
Lead Channel Pacing Threshold Amplitude: 2.25 V
Lead Channel Pacing Threshold Pulse Width: 0.4 ms
Lead Channel Pacing Threshold Pulse Width: 0.5 ms
Lead Channel Pacing Threshold Pulse Width: 1 ms
Lead Channel Sensing Intrinsic Amplitude: 12 mV
Lead Channel Sensing Intrinsic Amplitude: 2.5 mV
Lead Channel Setting Pacing Amplitude: 2 V
Lead Channel Setting Pacing Amplitude: 2.5 V
Lead Channel Setting Pacing Amplitude: 3.25 V
Lead Channel Setting Pacing Pulse Width: 0.5 ms
Lead Channel Setting Pacing Pulse Width: 1 ms
Lead Channel Setting Sensing Sensitivity: 5 mV
Pulse Gen Model: 3562
Pulse Gen Serial Number: 9141456

## 2020-02-16 NOTE — Progress Notes (Signed)
Remote pacemaker transmission.   

## 2020-05-13 ENCOUNTER — Ambulatory Visit (INDEPENDENT_AMBULATORY_CARE_PROVIDER_SITE_OTHER): Payer: Medicare Other

## 2020-05-13 DIAGNOSIS — I480 Paroxysmal atrial fibrillation: Secondary | ICD-10-CM

## 2020-05-14 LAB — CUP PACEART REMOTE DEVICE CHECK
Battery Remaining Longevity: 62 mo
Battery Remaining Percentage: 95.5 %
Battery Voltage: 2.96 V
Brady Statistic AP VP Percent: 80 %
Brady Statistic AP VS Percent: 1 %
Brady Statistic AS VP Percent: 19 %
Brady Statistic AS VS Percent: 1 %
Brady Statistic RA Percent Paced: 59 %
Date Time Interrogation Session: 20211123012347
Implantable Lead Implant Date: 20070720
Implantable Lead Implant Date: 20070720
Implantable Lead Implant Date: 20140602
Implantable Lead Location: 753858
Implantable Lead Location: 753859
Implantable Lead Location: 753860
Implantable Lead Model: 4092
Implantable Lead Model: 5076
Implantable Pulse Generator Implant Date: 20200819
Lead Channel Impedance Value: 430 Ohm
Lead Channel Impedance Value: 550 Ohm
Lead Channel Impedance Value: 830 Ohm
Lead Channel Pacing Threshold Amplitude: 0.5 V
Lead Channel Pacing Threshold Amplitude: 0.75 V
Lead Channel Pacing Threshold Amplitude: 1.625 V
Lead Channel Pacing Threshold Pulse Width: 0.4 ms
Lead Channel Pacing Threshold Pulse Width: 0.5 ms
Lead Channel Pacing Threshold Pulse Width: 1 ms
Lead Channel Sensing Intrinsic Amplitude: 12 mV
Lead Channel Sensing Intrinsic Amplitude: 3.2 mV
Lead Channel Setting Pacing Amplitude: 2 V
Lead Channel Setting Pacing Amplitude: 2.5 V
Lead Channel Setting Pacing Amplitude: 2.625
Lead Channel Setting Pacing Pulse Width: 0.5 ms
Lead Channel Setting Pacing Pulse Width: 1 ms
Lead Channel Setting Sensing Sensitivity: 5 mV
Pulse Gen Model: 3562
Pulse Gen Serial Number: 9141456

## 2020-05-15 NOTE — Progress Notes (Signed)
Remote pacemaker transmission.   

## 2020-08-12 ENCOUNTER — Ambulatory Visit (INDEPENDENT_AMBULATORY_CARE_PROVIDER_SITE_OTHER): Payer: Medicare Other

## 2020-08-12 DIAGNOSIS — I442 Atrioventricular block, complete: Secondary | ICD-10-CM

## 2020-08-13 ENCOUNTER — Other Ambulatory Visit: Payer: Self-pay | Admitting: Internal Medicine

## 2020-08-13 MED ORDER — BISOPROLOL FUMARATE 5 MG PO TABS: 2.5000 mg | ORAL_TABLET | Freq: Every day | ORAL | 0 refills | Status: DC

## 2020-08-19 LAB — CUP PACEART REMOTE DEVICE CHECK
Battery Remaining Longevity: 65 mo
Battery Remaining Percentage: 95.5 %
Battery Voltage: 2.96 V
Brady Statistic AP VP Percent: 83 %
Brady Statistic AP VS Percent: 1 %
Brady Statistic AS VP Percent: 16 %
Brady Statistic AS VS Percent: 1 %
Brady Statistic RA Percent Paced: 62 %
Date Time Interrogation Session: 20220227000246
Implantable Lead Implant Date: 20070720
Implantable Lead Implant Date: 20070720
Implantable Lead Implant Date: 20140602
Implantable Lead Location: 753858
Implantable Lead Location: 753859
Implantable Lead Location: 753860
Implantable Lead Model: 4092
Implantable Lead Model: 5076
Implantable Pulse Generator Implant Date: 20200819
Lead Channel Impedance Value: 440 Ohm
Lead Channel Impedance Value: 560 Ohm
Lead Channel Impedance Value: 840 Ohm
Lead Channel Pacing Threshold Amplitude: 0.5 V
Lead Channel Pacing Threshold Amplitude: 0.75 V
Lead Channel Pacing Threshold Amplitude: 2.125 V
Lead Channel Pacing Threshold Pulse Width: 0.4 ms
Lead Channel Pacing Threshold Pulse Width: 0.5 ms
Lead Channel Pacing Threshold Pulse Width: 1 ms
Lead Channel Sensing Intrinsic Amplitude: 12 mV
Lead Channel Sensing Intrinsic Amplitude: 3.2 mV
Lead Channel Setting Pacing Amplitude: 2 V
Lead Channel Setting Pacing Amplitude: 2.5 V
Lead Channel Setting Pacing Amplitude: 3.125
Lead Channel Setting Pacing Pulse Width: 0.5 ms
Lead Channel Setting Pacing Pulse Width: 1 ms
Lead Channel Setting Sensing Sensitivity: 5 mV
Pulse Gen Model: 3562
Pulse Gen Serial Number: 9141456

## 2020-08-20 NOTE — Progress Notes (Signed)
Remote pacemaker transmission.   

## 2020-09-09 ENCOUNTER — Other Ambulatory Visit: Payer: Self-pay | Admitting: Internal Medicine

## 2020-10-07 ENCOUNTER — Other Ambulatory Visit: Payer: Self-pay | Admitting: Internal Medicine

## 2020-10-24 ENCOUNTER — Other Ambulatory Visit: Payer: Self-pay | Admitting: Internal Medicine

## 2020-11-25 ENCOUNTER — Ambulatory Visit (INDEPENDENT_AMBULATORY_CARE_PROVIDER_SITE_OTHER): Payer: Medicare Other

## 2020-11-25 DIAGNOSIS — I442 Atrioventricular block, complete: Secondary | ICD-10-CM | POA: Diagnosis not present

## 2020-11-25 LAB — CUP PACEART REMOTE DEVICE CHECK
Battery Remaining Longevity: 77 mo
Battery Remaining Percentage: 95.5 %
Battery Voltage: 2.96 V
Brady Statistic AP VP Percent: 82 %
Brady Statistic AP VS Percent: 1 %
Brady Statistic AS VP Percent: 17 %
Brady Statistic AS VS Percent: 1 %
Brady Statistic RA Percent Paced: 62 %
Date Time Interrogation Session: 20220605215242
Implantable Lead Implant Date: 20070720
Implantable Lead Implant Date: 20070720
Implantable Lead Implant Date: 20140602
Implantable Lead Location: 753858
Implantable Lead Location: 753859
Implantable Lead Location: 753860
Implantable Lead Model: 4092
Implantable Lead Model: 5076
Implantable Pulse Generator Implant Date: 20200819
Lead Channel Impedance Value: 430 Ohm
Lead Channel Impedance Value: 510 Ohm
Lead Channel Impedance Value: 780 Ohm
Lead Channel Pacing Threshold Amplitude: 0.5 V
Lead Channel Pacing Threshold Amplitude: 0.75 V
Lead Channel Pacing Threshold Amplitude: 1.375 V
Lead Channel Pacing Threshold Pulse Width: 0.4 ms
Lead Channel Pacing Threshold Pulse Width: 0.5 ms
Lead Channel Pacing Threshold Pulse Width: 1 ms
Lead Channel Sensing Intrinsic Amplitude: 12 mV
Lead Channel Sensing Intrinsic Amplitude: 2.8 mV
Lead Channel Setting Pacing Amplitude: 2 V
Lead Channel Setting Pacing Amplitude: 2.375
Lead Channel Setting Pacing Amplitude: 2.5 V
Lead Channel Setting Pacing Pulse Width: 0.5 ms
Lead Channel Setting Pacing Pulse Width: 1 ms
Lead Channel Setting Sensing Sensitivity: 5 mV
Pulse Gen Model: 3562
Pulse Gen Serial Number: 9141456

## 2020-12-17 NOTE — Progress Notes (Signed)
Remote pacemaker transmission.   

## 2022-07-23 DEATH — deceased
